# Patient Record
Sex: Female | Born: 1940 | Race: Black or African American | Hispanic: No | Marital: Single | Smoking: Current every day smoker
Health system: Southern US, Community
[De-identification: ages and names within clinical notes are randomized; demographics above are authoritative.]

## PROBLEM LIST (undated history)

## (undated) DIAGNOSIS — Z720 Tobacco use: Secondary | ICD-10-CM

## (undated) DIAGNOSIS — B888 Other specified infestations: Secondary | ICD-10-CM

## (undated) DIAGNOSIS — I10 Essential (primary) hypertension: Secondary | ICD-10-CM

## (undated) DIAGNOSIS — R609 Edema, unspecified: Secondary | ICD-10-CM

## (undated) DIAGNOSIS — E785 Hyperlipidemia, unspecified: Secondary | ICD-10-CM

## (undated) DIAGNOSIS — I77810 Thoracic aortic ectasia: Secondary | ICD-10-CM

## (undated) DIAGNOSIS — F1010 Alcohol abuse, uncomplicated: Secondary | ICD-10-CM

## (undated) DIAGNOSIS — E039 Hypothyroidism, unspecified: Secondary | ICD-10-CM

## (undated) DIAGNOSIS — I878 Other specified disorders of veins: Secondary | ICD-10-CM

## (undated) HISTORY — PX: BUNIONECTOMY: SHX129

## (undated) HISTORY — PX: ABDOMINAL HYSTERECTOMY: SHX81

---

## 1997-06-02 ENCOUNTER — Emergency Department (HOSPITAL_COMMUNITY): Admission: EM | Admit: 1997-06-02 | Discharge: 1997-06-02 | Payer: Self-pay | Admitting: Emergency Medicine

## 1998-06-22 ENCOUNTER — Emergency Department (HOSPITAL_COMMUNITY): Admission: EM | Admit: 1998-06-22 | Discharge: 1998-06-22 | Payer: Self-pay | Admitting: Emergency Medicine

## 2000-06-30 ENCOUNTER — Emergency Department (HOSPITAL_COMMUNITY): Admission: EM | Admit: 2000-06-30 | Discharge: 2000-06-30 | Payer: Self-pay | Admitting: Emergency Medicine

## 2001-05-10 ENCOUNTER — Emergency Department (HOSPITAL_COMMUNITY): Admission: EM | Admit: 2001-05-10 | Discharge: 2001-05-10 | Payer: Self-pay | Admitting: Emergency Medicine

## 2001-10-14 ENCOUNTER — Emergency Department (HOSPITAL_COMMUNITY): Admission: EM | Admit: 2001-10-14 | Discharge: 2001-10-14 | Payer: Self-pay | Admitting: *Deleted

## 2001-11-12 ENCOUNTER — Emergency Department (HOSPITAL_COMMUNITY): Admission: EM | Admit: 2001-11-12 | Discharge: 2001-11-12 | Payer: Self-pay | Admitting: Emergency Medicine

## 2004-01-03 ENCOUNTER — Emergency Department (HOSPITAL_COMMUNITY): Admission: EM | Admit: 2004-01-03 | Discharge: 2004-01-04 | Payer: Self-pay | Admitting: Emergency Medicine

## 2006-08-10 ENCOUNTER — Emergency Department (HOSPITAL_COMMUNITY): Admission: EM | Admit: 2006-08-10 | Discharge: 2006-08-10 | Payer: Self-pay | Admitting: Emergency Medicine

## 2010-11-25 ENCOUNTER — Ambulatory Visit (INDEPENDENT_AMBULATORY_CARE_PROVIDER_SITE_OTHER): Payer: Medicaid Other

## 2010-11-25 ENCOUNTER — Inpatient Hospital Stay (INDEPENDENT_AMBULATORY_CARE_PROVIDER_SITE_OTHER)
Admission: RE | Admit: 2010-11-25 | Discharge: 2010-11-25 | Disposition: A | Discharge status: Home / Self Care | Payer: Medicare Other | Source: Ambulatory Visit | Attending: Family Medicine | Admitting: Family Medicine

## 2010-11-25 DIAGNOSIS — R071 Chest pain on breathing: Secondary | ICD-10-CM

## 2010-11-25 DIAGNOSIS — R079 Chest pain, unspecified: Secondary | ICD-10-CM

## 2010-11-25 DIAGNOSIS — M79609 Pain in unspecified limb: Secondary | ICD-10-CM

## 2010-11-25 DIAGNOSIS — I889 Nonspecific lymphadenitis, unspecified: Secondary | ICD-10-CM

## 2010-12-06 LAB — I-STAT 8, (EC8 V) (CONVERTED LAB)
BUN: 8
Bicarbonate: 26.7 — ABNORMAL HIGH
Glucose, Bld: 95
Hemoglobin: 14.6
Operator id: 161631
Sodium: 139
TCO2: 28
pCO2, Ven: 45.1

## 2010-12-06 LAB — POCT CARDIAC MARKERS
CKMB, poc: 1.9
Myoglobin, poc: 71.5
Operator id: 161631

## 2010-12-06 LAB — POCT I-STAT CREATININE: Creatinine, Ser: 0.7

## 2012-09-21 ENCOUNTER — Encounter (HOSPITAL_COMMUNITY): Payer: Self-pay | Admitting: Unknown Physician Specialty

## 2012-09-21 ENCOUNTER — Emergency Department (HOSPITAL_COMMUNITY)
Admission: EM | Admit: 2012-09-21 | Discharge: 2012-09-22 | Disposition: A | Discharge status: Home / Self Care | Payer: Medicare Other | Attending: Emergency Medicine | Admitting: Emergency Medicine

## 2012-09-21 ENCOUNTER — Emergency Department (HOSPITAL_COMMUNITY): Payer: Medicare Other

## 2012-09-21 DIAGNOSIS — F17200 Nicotine dependence, unspecified, uncomplicated: Secondary | ICD-10-CM | POA: Insufficient documentation

## 2012-09-21 DIAGNOSIS — R55 Syncope and collapse: Secondary | ICD-10-CM | POA: Insufficient documentation

## 2012-09-21 DIAGNOSIS — Z789 Other specified health status: Secondary | ICD-10-CM

## 2012-09-21 DIAGNOSIS — F1010 Alcohol abuse, uncomplicated: Secondary | ICD-10-CM | POA: Insufficient documentation

## 2012-09-21 LAB — URINALYSIS, ROUTINE W REFLEX MICROSCOPIC
Hgb urine dipstick: NEGATIVE
Nitrite: NEGATIVE
Protein, ur: NEGATIVE mg/dL
Specific Gravity, Urine: 1.006 (ref 1.005–1.030)
Urobilinogen, UA: 1 mg/dL (ref 0.0–1.0)

## 2012-09-21 LAB — CBC WITH DIFFERENTIAL/PLATELET
Basophils Absolute: 0 10*3/uL (ref 0.0–0.1)
Eosinophils Relative: 3 % (ref 0–5)
HCT: 36.6 % (ref 36.0–46.0)
Hemoglobin: 12.8 g/dL (ref 12.0–15.0)
Lymphocytes Relative: 47 % — ABNORMAL HIGH (ref 12–46)
Lymphs Abs: 2.4 10*3/uL (ref 0.7–4.0)
MCV: 95.6 fL (ref 78.0–100.0)
Monocytes Absolute: 0.4 10*3/uL (ref 0.1–1.0)
Monocytes Relative: 7 % (ref 3–12)
Neutro Abs: 2.2 10*3/uL (ref 1.7–7.7)
RBC: 3.83 MIL/uL — ABNORMAL LOW (ref 3.87–5.11)
RDW: 13.1 % (ref 11.5–15.5)
WBC: 5.1 10*3/uL (ref 4.0–10.5)

## 2012-09-21 LAB — COMPREHENSIVE METABOLIC PANEL
AST: 18 U/L (ref 0–37)
BUN: 10 mg/dL (ref 6–23)
CO2: 23 mEq/L (ref 19–32)
Calcium: 8.9 mg/dL (ref 8.4–10.5)
Chloride: 104 mEq/L (ref 96–112)
Creatinine, Ser: 0.58 mg/dL (ref 0.50–1.10)
GFR calc Af Amer: >90 mL/min (ref >90)
GFR calc non Af Amer: >90 mL/min (ref >90)
Glucose, Bld: 96 mg/dL (ref 70–99)
Total Bilirubin: 0.5 mg/dL (ref 0.3–1.2)

## 2012-09-21 LAB — RAPID URINE DRUG SCREEN, HOSP PERFORMED
Barbiturates: NOT DETECTED
Opiates: NOT DETECTED
Tetrahydrocannabinol: POSITIVE — AB

## 2012-09-21 LAB — POCT I-STAT TROPONIN I: Troponin i, poc: 0.01 ng/mL (ref 0.00–0.08)

## 2012-09-21 MED ORDER — SODIUM CHLORIDE 0.9 % IV BOLUS (SEPSIS)
1000.0000 mL | Freq: Once | INTRAVENOUS | Status: AC
  Administered 2012-09-21: 1000 mL via INTRAVENOUS

## 2012-09-21 NOTE — ED Notes (Signed)
Patient arrived via [facility] from home. Neighbor called EMS due to patient passed out on her porch. EMS states patient is easily aroused and states she admits to ETOH of 3 beers today. Patients son recently passed away and patient is tearful. VSS.

## 2012-09-21 NOTE — ED Notes (Signed)
Pt was able to ambulate to the bathroom to give [place] a urine sample. Pt tolerated walking well.

## 2012-09-21 NOTE — ED Provider Notes (Addendum)
CSN: [id number]     Arrival date & time 09/21/12  1842 History     First MD Initiated Contact with Patient 09/21/12 1854     Chief Complaint  Patient presents with  . Altered Mental Status   (Consider location/radiation/quality/duration/timing/severity/associated sxs/prior Treatment) Patient is a 72 y.o. female presenting with syncope. The history is provided by the patient and the EMS personnel.  Loss of Consciousness Episode history:  Single Most recent episode:  Today Duration: unknown. Timing:  Constant Progression:  Resolved Chronicity:  New Context comment:  Pt's son died 2 days ago and she has been morning and crying and states drank 2-3 drinks today.  neighbor say pt slumped over sitting on the porch.  when EMS arrive pt was easily arousable Witnessed: yes   Relieved by:  Nothing Worsened by:  Nothing tried Ineffective treatments:  None tried Associated symptoms: no chest pain, no difficulty breathing, no fever, no focal weakness, no nausea, no shortness of breath, no vomiting and no weakness   Risk factors: no coronary artery disease   Risk factors comment:  Pt does smoke and drinks alcohol regularly   History reviewed. No pertinent past medical history. Past Surgical History  Procedure Laterality Date  . Abdominal hysterectomy     No family history on file. History  Substance Use Topics  . Smoking status: Current Every Day Smoker  . Smokeless tobacco: Not on file  . Alcohol Use: 1.2 oz/week    2 Cans of beer per week     Comment: 40 oz every other day.   OB History   Grav Para Term Preterm Abortions TAB SAB Ect Mult Living                 Review of Systems  Constitutional: Negative for fever.  Respiratory: Negative for shortness of breath.   Cardiovascular: Positive for syncope. Negative for chest pain.  Gastrointestinal: Negative for nausea and vomiting.  Neurological: Negative for focal weakness and weakness.  All other systems reviewed and are  negative.    Allergies  Review of patient's allergies indicates no known allergies.  Home Medications  No current outpatient prescriptions on file. BP 130/89  Pulse 107  Temp(Src) 97.9 F (36.6 C) (Oral)  Resp 20  SpO2 98% Physical Exam  Nursing note and vitals reviewed. Constitutional: She appears well-developed and well-nourished. No distress.  tearful  HENT:  Head: Normocephalic and atraumatic.  Mouth/Throat: Oropharynx is clear and moist.  Eyes: Conjunctivae and EOM are normal. Pupils are equal, round, and reactive to light.  Neck: Normal range of motion. Neck supple.  Cardiovascular: Normal rate, regular rhythm and intact distal pulses.   No murmur heard. Pulmonary/Chest: Effort normal and breath sounds normal. No respiratory distress. She has no wheezes. She has no rales. She exhibits no tenderness.  Abdominal: Soft. She exhibits no distension. There is no tenderness. There is no rebound and no guarding.  Musculoskeletal: Normal range of motion. She exhibits no edema and no tenderness.  Neurological: She is alert. She has normal strength. No sensory deficit.  Oriented to person and place but not year or day  Skin: Skin is warm and dry. No rash noted. No erythema.  Psychiatric: She has a normal mood and affect. Her behavior is normal.    ED Course   Procedures (including critical care time)  Labs Reviewed  CBC WITH DIFFERENTIAL - Abnormal; Notable for the following:    RBC 3.83 (*)    Neutrophils Relative % 42 (*)  Lymphocytes Relative 47 (*)    All other components within normal limits  COMPREHENSIVE METABOLIC PANEL - Abnormal; Notable for the following:    Potassium 3.3 (*)    Albumin 3.3 (*)    All other components within normal limits  URINE RAPID DRUG SCREEN (HOSP PERFORMED) - Abnormal; Notable for the following:    Tetrahydrocannabinol POSITIVE (*)    All other components within normal limits  ETHANOL - Abnormal; Notable for the following:    Alcohol,  Ethyl (B) 103 (*)    All other components within normal limits  URINALYSIS, ROUTINE W REFLEX MICROSCOPIC  POCT I-STAT TROPONIN I   Dg Chest 2 View  09/21/2012   *RADIOLOGY REPORT*  Clinical Data: Syncopal episode.  CHEST - 2 VIEW  Comparison: 08/10/2006.  Findings: The cardiac silhouette, mediastinal and hilar contours are normal and stable.  The lungs are clear.  No pleural effusion. The bony thorax is intact.  IMPRESSION: No acute cardiopulmonary findings.   Original Report Authenticated By: Rudie Meyer, M.D.     Date: 09/21/2012  Rate: 75  Rhythm: normal sinus rhythm  QRS Axis: normal  Intervals: normal  ST/T Wave abnormalities: nonspecific ST/T changes  Conduction Disutrbances:none  Narrative Interpretation:   Old EKG Reviewed: new t-wave inversion in anterior/inferior leads new from 2012   1. Alcohol consumption heavy   2. Syncope     MDM   The patient and is with the complaint of syncope today. Patient states she did drink 3-4 beers and has been wanting today because her son died 2 days ago.  She states she has been very upset today but hasn't eaten [place] sitting on her porch and noticed that she was slumped over and passed out. Patient was easily aroused when EMS arrived. She was awake and tearful here but has no focal complaints except that she does not feel well. He is likely to syncope was related to alcohol intoxication. She takes no medications she has no medical problems and her vital signs here are normal except for mild tachycardia. She is oriented to place and person but is disoriented to year.  Suspicion for cardiac etiology however patient may also have electrolyte abnormalities given her alcohol use., CMP, UA, UDS, EtOH, chest x-ray, EKG, troponin pending.  9:41 PM Labs unremarkable except for EtOH of 103 which is most likely the cause of pt passing out while sitting on her porch today.  Pt does have some non-specific ST/T changes from 2 years ago however denies CP  or SOB at any time today.  10:51 PM Pt feeling better and able to ambulate unassisted without difficulty.  Will d/c home.  Gwyneth Sprout, MD 09/21/12 1914  Gwyneth Sprout, MD 09/21/12 2257

## 2013-06-01 ENCOUNTER — Emergency Department (HOSPITAL_COMMUNITY): Payer: Medicare Other

## 2013-06-01 ENCOUNTER — Observation Stay (HOSPITAL_COMMUNITY)
Admission: EM | Admit: 2013-06-01 | Discharge: 2013-06-02 | Disposition: A | Discharge status: Home / Self Care | Payer: Medicare Other | Attending: Family Medicine | Admitting: Family Medicine

## 2013-06-01 ENCOUNTER — Encounter (HOSPITAL_COMMUNITY): Payer: Self-pay | Admitting: Emergency Medicine

## 2013-06-01 ENCOUNTER — Emergency Department (INDEPENDENT_AMBULATORY_CARE_PROVIDER_SITE_OTHER)
Admission: EM | Admit: 2013-06-01 | Discharge: 2013-06-01 | Disposition: A | Discharge status: Home / Self Care | Payer: Medicare Other | Source: Home / Self Care | Attending: Emergency Medicine | Admitting: Emergency Medicine

## 2013-06-01 DIAGNOSIS — R0602 Shortness of breath: Secondary | ICD-10-CM | POA: Insufficient documentation

## 2013-06-01 DIAGNOSIS — R0789 Other chest pain: Secondary | ICD-10-CM | POA: Diagnosis not present

## 2013-06-01 DIAGNOSIS — I209 Angina pectoris, unspecified: Secondary | ICD-10-CM | POA: Diagnosis not present

## 2013-06-01 DIAGNOSIS — F17200 Nicotine dependence, unspecified, uncomplicated: Secondary | ICD-10-CM | POA: Diagnosis not present

## 2013-06-01 DIAGNOSIS — M25519 Pain in unspecified shoulder: Secondary | ICD-10-CM | POA: Insufficient documentation

## 2013-06-01 DIAGNOSIS — R079 Chest pain, unspecified: Secondary | ICD-10-CM | POA: Diagnosis present

## 2013-06-01 DIAGNOSIS — R9431 Abnormal electrocardiogram [ECG] [EKG]: Secondary | ICD-10-CM

## 2013-06-01 LAB — COMPREHENSIVE METABOLIC PANEL
ALT: 9 U/L (ref 0–35)
AST: 16 U/L (ref 0–37)
Albumin: 3.3 g/dL — ABNORMAL LOW (ref 3.5–5.2)
Alkaline Phosphatase: 83 U/L (ref 39–117)
BILIRUBIN TOTAL: 0.7 mg/dL (ref 0.3–1.2)
BUN: 8 mg/dL (ref 6–23)
CALCIUM: 8.8 mg/dL (ref 8.4–10.5)
CO2: 24 meq/L (ref 19–32)
CREATININE: 0.47 mg/dL — AB (ref 0.50–1.10)
Chloride: 105 mEq/L (ref 96–112)
GLUCOSE: 88 mg/dL (ref 70–99)
Potassium: 3.9 mEq/L (ref 3.7–5.3)
Sodium: 143 mEq/L (ref 137–147)
Total Protein: 6.9 g/dL (ref 6.0–8.3)

## 2013-06-01 LAB — LIPID PANEL
Cholesterol: 202 mg/dL — ABNORMAL HIGH (ref 0–200)
HDL: 101 mg/dL (ref >39)
LDL CALC: 85 mg/dL (ref 0–99)
Total CHOL/HDL Ratio: 2 RATIO
Triglycerides: 82 mg/dL (ref <150)
VLDL: 16 mg/dL (ref 0–40)

## 2013-06-01 LAB — TSH: TSH: 6.81 u[IU]/mL — ABNORMAL HIGH (ref 0.350–4.500)

## 2013-06-01 LAB — CBC
HEMATOCRIT: 36 % (ref 36.0–46.0)
Hemoglobin: 12.3 g/dL (ref 12.0–15.0)
MCH: 33.1 pg (ref 26.0–34.0)
MCHC: 34.2 g/dL (ref 30.0–36.0)
MCV: 96.8 fL (ref 78.0–100.0)
PLATELETS: 218 10*3/uL (ref 150–400)
RBC: 3.72 MIL/uL — AB (ref 3.87–5.11)
RDW: 13.3 % (ref 11.5–15.5)
WBC: 6.8 10*3/uL (ref 4.0–10.5)

## 2013-06-01 LAB — PRO B NATRIURETIC PEPTIDE: PRO B NATRI PEPTIDE: 92.3 pg/mL (ref 0–125)

## 2013-06-01 LAB — TROPONIN I: Troponin I: <0.3 ng/mL (ref <0.30)

## 2013-06-01 LAB — I-STAT TROPONIN, ED: TROPONIN I, POC: 0 ng/mL (ref 0.00–0.08)

## 2013-06-01 MED ORDER — ASPIRIN EC 325 MG PO TBEC
325.0000 mg | DELAYED_RELEASE_TABLET | Freq: Every day | ORAL | Status: DC
  Administered 2013-06-01 – 2013-06-02 (×2): 325 mg via ORAL
  Filled 2013-06-01 (×2): qty 1

## 2013-06-01 MED ORDER — LORAZEPAM 2 MG/ML IJ SOLN: 1.0000 mg | Freq: Four times a day (QID) | INTRAMUSCULAR | Status: DC | PRN

## 2013-06-01 MED ORDER — FOLIC ACID 1 MG PO TABS
1.0000 mg | ORAL_TABLET | Freq: Every day | ORAL | Status: DC
  Administered 2013-06-01 – 2013-06-02 (×2): 1 mg via ORAL
  Filled 2013-06-01 (×2): qty 1

## 2013-06-01 MED ORDER — VITAMIN B-1 100 MG PO TABS
100.0000 mg | ORAL_TABLET | Freq: Every day | ORAL | Status: DC
  Administered 2013-06-01 – 2013-06-02 (×2): 100 mg via ORAL
  Filled 2013-06-01 (×2): qty 1

## 2013-06-01 MED ORDER — SODIUM CHLORIDE 0.9 % IV SOLN
INTRAVENOUS | Status: DC
  Administered 2013-06-01: 14:00:00 via INTRAVENOUS

## 2013-06-01 MED ORDER — MORPHINE SULFATE 2 MG/ML IJ SOLN: 1.0000 mg | INTRAMUSCULAR | Status: DC | PRN

## 2013-06-01 MED ORDER — LORAZEPAM 1 MG PO TABS: 1.0000 mg | ORAL_TABLET | Freq: Four times a day (QID) | ORAL | Status: DC | PRN

## 2013-06-01 MED ORDER — ASPIRIN 81 MG PO CHEW
CHEWABLE_TABLET | ORAL | Status: AC
  Filled 2013-06-01: qty 4

## 2013-06-01 MED ORDER — HEPARIN SODIUM (PORCINE) 5000 UNIT/ML IJ SOLN
5000.0000 [IU] | Freq: Three times a day (TID) | INTRAMUSCULAR | Status: DC
  Administered 2013-06-01 – 2013-06-02 (×2): 5000 [IU] via SUBCUTANEOUS
  Filled 2013-06-01 (×5): qty 1

## 2013-06-01 MED ORDER — ADULT MULTIVITAMIN W/MINERALS CH
1.0000 | ORAL_TABLET | Freq: Every day | ORAL | Status: DC
  Administered 2013-06-01 – 2013-06-02 (×2): 1 via ORAL
  Filled 2013-06-01 (×2): qty 1

## 2013-06-01 MED ORDER — THIAMINE HCL 100 MG/ML IJ SOLN
100.0000 mg | Freq: Every day | INTRAMUSCULAR | Status: DC
  Filled 2013-06-01 (×2): qty 1

## 2013-06-01 MED ORDER — NICOTINE 7 MG/24HR TD PT24
7.0000 mg | MEDICATED_PATCH | Freq: Every day | TRANSDERMAL | Status: DC
  Administered 2013-06-01 – 2013-06-02 (×2): 7 mg via TRANSDERMAL
  Filled 2013-06-01 (×2): qty 1

## 2013-06-01 MED ORDER — ASPIRIN 81 MG PO CHEW
324.0000 mg | CHEWABLE_TABLET | Freq: Once | ORAL | Status: AC
  Administered 2013-06-01: 324 mg via ORAL

## 2013-06-01 NOTE — H&P (Signed)
[facility] Admission History and Physical Service Pager: [phone]  Patient name: Ann Goodwin Medical record number: [id number] Date of birth: [birth year] Age: 73 y.o. Gender: female  Primary Care Provider: No PCP Per Patient Consultants: Cardiology Code Status: full  Chief Complaint: Chest Pain  Assessment and Plan: Ann Goodwin is a 73 y.o. female presenting with dyspnea and chest pain. PMH is significant for no medical condition, but lack of medical care.   # Chest Pain - Heart score 4. Chest pain story consistent with angina (stable vs unstable - down plays history per family members) vs possible COPD. istat trop negative and EKG: NSR with twave changes in leads 2,3 aVF that are unchanged from previous.  - Risk stat labs: A1c, TSH, Lipids - ECHO - BNP - repeat EKG in am - cycle troponins - ASA  # Daily Alcohol use: Pt admits to at least 3-16 ounce beers a day. Last drink yesterday (4/12)  - CIWA - UDS  FEN/GI: SLIV, Heart healthy diet - then NPO after midnight Prophylaxis: heparin subq  Disposition: Tele; discharge pending cardiac work-up  History of Present Illness: Ann Goodwin is a 73 y.o. female presenting with chest pain on and off x 6 months. Sharp left-side chest pain that radiates to right chest - substernum, is associated with SOB and orthopnea, and occassionally worse with exertion. She denies any chest pressure, n/v/d, fevers, or hx of MI, clots, HTN, DM, or FHx of MI. She admits to occasional leg edema bilaterally. She admits to smoking (~ 15 ppd hx) and drinking at least 3- 16 ounce beers nightly.    Review Of Systems: Per HPI with the following additions:  Otherwise 12 point review of systems was performed and was unremarkable.  Patient Active Problem List   Diagnosis Date Noted  . Chest pain 06/01/2013   Past Medical History: History reviewed. No pertinent past medical history. Past Surgical History: Past  Surgical History  Procedure Laterality Date  . Abdominal hysterectomy     Social History: History  Substance Use Topics  . Smoking status: Current Every Day Smoker  . Smokeless tobacco: Not on file  . Alcohol Use: 1.2 oz/week    2 Cans of beer per week     Comment: 40 oz every other day.   Additional social history:   Please also refer to relevant sections of EMR.  Family History: No family history on file. Allergies and Medications: No Known Allergies No current facility-administered medications on file prior to encounter.   No current outpatient prescriptions on file prior to encounter.    Objective: BP 140/80  Pulse 64  Temp(Src) 98.3 F (36.8 C) (Oral)  Resp 17  SpO2 100% Exam: General: Thin AA female in NAD. Nontoxic in appearance. HEENT: AT.[place]. MMM. Icterus present in bilateral eyes. Mild injections bilateral eyes. No LAD. Trachea midline. No thyroid megaly noted.  Cardiovascular: RRR, no m/r/g. No JVD Respiratory: CTAB, normal resp effort Abdomen: Soft, NTND, BS +. No masses. Vertical hysterectomy scar.  Extremities: WWP, No LE edema, no erythema, not TTP. +2/4 pulses bilaterally.  Neuro: A&O x3. PERLA., EOMI. CN2-12 grossly intact. No focal deficits. MS 5/5 bilateral UE/LE.   Labs and Imaging: CBC BMET   Recent Labs Lab 06/01/13 1518  WBC 6.8  HGB 12.3  HCT 36.0  PLT 218    Recent Labs Lab 06/01/13 1518  NA 143  K 3.9  CL 105  CO2 24  BUN 8  CREATININE 0.47*  GLUCOSE 88  CALCIUM 8.8     Troponin (Point of Care Test)  Recent Labs  06/01/13 1610  TROPIPOC 0.00     CXR 4/13 FINDINGS:  The heart size and mediastinal contours are within normal limits.  Both lungs are clear. The visualized skeletal structures are  unremarkable.  IMPRESSION:  No active cardiopulmonary disease.   Phill Myron, MD 06/01/2013, 6:23 PM PGY-1, [place] Intern pager: [phone], text pages welcome  Ma Hillock DO PGY-2  I  have examined the patient today with the intern. We have discussed the  assessment and plan with the attending. The intern note above, has been altered to reflect the exam, assessment and plan. Howard Pouch, DO

## 2013-06-01 NOTE — ED Notes (Signed)
C/o chest pain, numbness in hands, onset February 2015.  Patient denied chest pain presently.  Patient reports being tired and sob.  Sob has been intermittent.  Denies nausea or vomiting.  Patient delayed seeking medical attention until now because of no insurance plan.

## 2013-06-01 NOTE — ED Notes (Signed)
Cp on and off x 6 months left arm hurts pain rads to shoulder that pain stays hurts to move arm pt pain free now

## 2013-06-01 NOTE — ED Notes (Signed)
Pt arrived via [facility] already in gown, pt placed on monitor, continuous pulse oximetry and blood pressure cuff; family at bedside

## 2013-06-01 NOTE — ED Provider Notes (Signed)
Chief Complaint   Chief Complaint  Patient presents with  . Chest Pain    History of Present Illness    Ann Goodwin is a 73 year old female who presents with a 2 month history of daily, nonexertional, left pectoral chest pain which sometimes is located on the right. The pain can last 10 minutes at a time. It's not related to position, exertion, activity, or meals. It does not radiate into the neck, upper back, down the arms. The pain is rated as 7-8/10 in intensity. It's been associated with some shortness of breath and diaphoresis. It's not been associated with nausea. The patient also has some aching in her left shoulder, numbness of both of her hands, dizziness, shortness of breath, was not sleeping at night. She's felt hot and cold and has had coughing and wheezing. She was hospitalized 10-15 years ago with similar pain but according to her nothing was found. She has no history of heart disease. She has no history of diabetes, hyperlipidemia, hypertension, although she is not seen a primary care physician for years. She smokes about 3 cigarettes per day and drinks about 2 40 ounce beers per day.  Review of Systems    Other than noted above, the patient denies any of the following symptoms. Systemic:  No fever or chills. Pulmonary:  No cough, wheezing, shortness of breath, sputum production, hemoptysis. Cardiac:  No palpitations, rapid heartbeat, dizziness, presyncope or syncope. GI:  No abdominal pain, heartburn, nausea, or vomiting. Ext:  No leg pain or swelling.  [place]    Past medical history, family history, social history, meds, and allergies were reviewed.   Physical Exam     Vital signs:  BP 128/82  Pulse 89  Temp(Src) 98.1 F (36.7 C) (Oral)  Resp 22  SpO2 100% Gen:  Alert, oriented, in no distress, skin warm and dry. Eye:  PERRL, lids and conjunctivas normal.  Sclera non-icteric. ENT:  Mucous membranes moist, pharynx clear. Neck:  Supple, no adenopathy or  tenderness.  No JVD. Lungs:  Clear to auscultation, no wheezes, rales or rhonchi.  No respiratory distress. Heart:  Regular rhythm.  No gallops, murmers, clicks or rubs. Chest:  No chest wall tenderness. Abdomen:  Soft, nontender, no organomegaly or mass.  Bowel sounds normal.  No pulsatile abdominal mass or bruit. Ext:  No edema.  No calf tenderness and Homann's sign negative.  Pulses full and equal. Skin:  Warm and dry.  No rash.   EKG Results:  Date: 06/01/2013  Rate: 81  Rhythm: normal sinus rhythm  QRS Axis: normal  Intervals: normal  ST/T Wave abnormalities: normal  Conduction Disutrbances:none  Narrative Interpretation: Normal sinus rhythm, voltage criteria for left ventricular hypertrophy.  Old EKG Reviewed: unchanged    Course in Urgent [place]         And begun on IV normal saline at 50 mL per hour and given aspirin 325 mg by mouth. She was not given nitroglycerin and she's not in any pain right now.  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  Plan     The patient was transferred to the ED via [place] in stable condition.  Medical Decision Making:  73 year old female smoker presents with a 2 month history of non exertional left or right pectoral chest pain without radiation.  Episodes last 10 minutes and are accompanied by shortness of breath and sweats.  No nausea.  No cardiac history.  No history of HT, DM, or hyperlipidemia, but has not been to see a physician for years.  Smokes 3 cigarettes a day and drinks 2 40 oz beers a day. Her EKG shows voltage criteria for LVH but otherwise was nl, still, I am concerned about unstable angina with her story and feel she needs further evaluation.        Harden Mo, MD 06/01/13 [phone]

## 2013-06-01 NOTE — Discharge Instructions (Signed)
We have determined that your problem requires further evaluation in the emergency department.  We will take care of your transport there.  Once at the emergency department, you will be evaluated by a provider and they will order whatever treatment or tests they deem necessary.  We cannot guarantee that they will do any specific test or do any specific treatment.  ° °

## 2013-06-01 NOTE — ED Provider Notes (Signed)
CSN: [id number]     Arrival date & time 06/01/13  1449 History   First MD Initiated Contact with Patient 06/01/13 1501     Chief Complaint  Patient presents with  . Chest Pain     (Consider location/radiation/quality/duration/timing/severity/associated sxs/prior Treatment) The history is provided by the patient.   history of present illness: 73 year old female who presents with chief complaint of chest pain. Onset of symptoms was 2-3 months ago. She reports daily chest pain occurring both with exertion and at rest. Chest pain occurs in various locations including the left chest, the central chest, and the right chest. The pain is nonradiating. When it occurs she rates it a 7-8/10. Patient has found no inciting or alleviating factors. She has some mild associated shortness of breath with the episodes. No increased frequency of episodes. No chest pain at time of exam. Patient also reports left shoulder pain. Left shoulder pain has been present for several months. The discomfort in her shoulder is constant and is not associated with her episodes of chest pain. Pain is rated mild to moderate in severity and is exacerbated by abduction of the left arm. Patient seen today at urgent care where she was given aspirin prior to transfer.  History reviewed. No pertinent past medical history. Past Surgical History  Procedure Laterality Date  . Abdominal hysterectomy     No family history on file. History  Substance Use Topics  . Smoking status: Current Every Day Smoker  . Smokeless tobacco: Not on file  . Alcohol Use: 1.2 oz/week    2 Cans of beer per week     Comment: 40 oz every other day.   OB History   Grav Para Term Preterm Abortions TAB SAB Ect Mult Living                 Review of Systems  Constitutional: Negative for fever and chills.  HENT: Negative for congestion.   Eyes: Negative for pain.  Respiratory: Positive for shortness of breath.   Cardiovascular: Positive for chest pain.   Gastrointestinal: Negative for nausea, vomiting, abdominal pain, diarrhea and constipation.  Genitourinary: Negative for dysuria.  Musculoskeletal: Positive for arthralgias (left shoulder). Negative for back pain.  Skin: Negative for rash and wound.  Neurological: Negative for headaches.  All other systems reviewed and are negative.     Allergies  Review of patient's allergies indicates no known allergies.  Home Medications  No current outpatient prescriptions on file. BP 144/80  Pulse 67  Temp(Src) 98.3 F (36.8 C) (Oral)  Resp 18  SpO2 100% Physical Exam  Nursing note and vitals reviewed. Constitutional: She is oriented to person, place, and time. She appears well-developed and well-nourished. No distress.  HENT:  Head: Normocephalic and atraumatic.  Eyes: Conjunctivae are normal.  Neck: Neck supple.  Cardiovascular: Normal rate, regular rhythm, normal heart sounds and intact distal pulses.   Pulmonary/Chest: Effort normal and breath sounds normal. She has no wheezes. She has no rales.  Abdominal: Soft. She exhibits no distension. There is no tenderness.  Musculoskeletal: Normal range of motion. She exhibits no edema.       Left shoulder: She exhibits pain (mild reproducible pain with abduction). She exhibits normal range of motion, no tenderness, no swelling, no effusion, no deformity and normal pulse.  Neurological: She is alert and oriented to person, place, and time.  Skin: Skin is warm and dry.    ED Course  Procedures (including critical care time) Labs Review Labs Reviewed -  No data to display Imaging Review No results found.   EKG Interpretation   Date/Time:  Monday June 01 2013 15:01:21 EDT Ventricular Rate:  66 PR Interval:  182 QRS Duration: 92 QT Interval:  434 QTC Calculation: 455 R Axis:   47 Text Interpretation:  Sinus rhythm Left ventricular hypertrophy No  significant change since last tracing Confirmed by Ashok Cordia  MD, Lennette Bihari  ([id number]) on  06/01/2013 3:07:27 PM      MDM   Final diagnoses:  None    ITZELL BENDAVID is a 73 y.o. female with PMSH of tobacco use who has not seen a doctor in several years here with 2-3 months of daily chest pain with associated dyspnea. Vital signs stable presentation. Patient given aspirin prior to arrival.  Symptoms somewhat atypical but concerning for possible ACS. Symptoms not consistent with PE.  Labs/Imaging: CBC, CMP, troponin, chest x-ray   Date: 06/01/2013  Rate: 66  Rhythm: normal sinus rhythm  QRS Axis: normal  Intervals: normal  ST/T Wave abnormalities: nonspecific T wave changes  Conduction Disutrbances:none  Narrative Interpretation: Unchanged nonspecific T wave changes laterally, otherwise normal EKG  Old EKG Reviewed: unchanged   Patient heart score of 5. Labs and chest x-ray unremarkable. Do to the patient's risk factors and lack of followup, feel admission to medicine for further cardiac rule out most appropriate.  Patient admitted to the family medicine teaching service.     Renaldo Reel, MD 06/02/13 [phone]

## 2013-06-02 ENCOUNTER — Other Ambulatory Visit: Payer: Self-pay

## 2013-06-02 ENCOUNTER — Encounter (HOSPITAL_COMMUNITY): Payer: Medicare Other

## 2013-06-02 ENCOUNTER — Observation Stay (HOSPITAL_COMMUNITY): Payer: Medicare Other

## 2013-06-02 DIAGNOSIS — R072 Precordial pain: Secondary | ICD-10-CM

## 2013-06-02 DIAGNOSIS — R9431 Abnormal electrocardiogram [ECG] [EKG]: Secondary | ICD-10-CM

## 2013-06-02 DIAGNOSIS — R0789 Other chest pain: Secondary | ICD-10-CM | POA: Diagnosis not present

## 2013-06-02 DIAGNOSIS — R079 Chest pain, unspecified: Secondary | ICD-10-CM

## 2013-06-02 LAB — HEMOGLOBIN A1C
HEMOGLOBIN A1C: 5.8 % — AB (ref <5.7)
Mean Plasma Glucose: 120 mg/dL — ABNORMAL HIGH (ref <117)

## 2013-06-02 LAB — RAPID URINE DRUG SCREEN, HOSP PERFORMED
Amphetamines: NOT DETECTED
Barbiturates: NOT DETECTED
Benzodiazepines: NOT DETECTED
Cocaine: NOT DETECTED
Opiates: NOT DETECTED
Tetrahydrocannabinol: NOT DETECTED

## 2013-06-02 LAB — T4, FREE: Free T4: 0.91 ng/dL (ref 0.80–1.80)

## 2013-06-02 LAB — TROPONIN I: Troponin I: <0.3 ng/mL (ref <0.30)

## 2013-06-02 MED ORDER — REGADENOSON 0.4 MG/5ML IV SOLN
INTRAVENOUS | Status: AC
  Administered 2013-06-02: 0.4 mg
  Filled 2013-06-02: qty 5

## 2013-06-02 MED ORDER — REGADENOSON 0.4 MG/5ML IV SOLN
0.4000 mg | Freq: Once | INTRAVENOUS | Status: DC
  Filled 2013-06-02: qty 5

## 2013-06-02 MED ORDER — TECHNETIUM TC 99M SESTAMIBI GENERIC - CARDIOLITE
10.0000 | Freq: Once | INTRAVENOUS | Status: AC | PRN
  Administered 2013-06-02: 10 via INTRAVENOUS

## 2013-06-02 MED ORDER — TECHNETIUM TC 99M SESTAMIBI GENERIC - CARDIOLITE
30.0000 | Freq: Once | INTRAVENOUS | Status: AC | PRN
  Administered 2013-06-02: 30 via INTRAVENOUS

## 2013-06-02 MED ORDER — FOLIC ACID 1 MG PO TABS: 1.0000 mg | ORAL_TABLET | Freq: Every day | ORAL | Status: DC

## 2013-06-02 MED ORDER — ASPIRIN 325 MG PO TBEC: 325.0000 mg | DELAYED_RELEASE_TABLET | Freq: Every day | ORAL | Status: DC

## 2013-06-02 NOTE — ED Provider Notes (Signed)
Medical screening examination/treatment/procedure(s) were conducted as a shared visit with resident-physician practitioner(s) and myself.  I personally evaluated the patient during the encounter.  Pt is a 73 y.o. female with pmhx as above presenting with 2-3 months of intermittent CP, migratory, sometimes exertional, w/ assoc dyspnea.  She does not regularly seek medical care and has never had cardiac w/u.  Pt found to have nml trop & CXR, EKG w/o acute ischemic changes.   On PE, VSS, pt in NAD. Cardiopulm exam benign. No current pain.  HEART score of 5. I feel she would benefit from ACS r/o. Medicine admitting.     EKG Interpretation  Date/Time:  Tuesday June 02 2013 04:48:36 EDT Ventricular Rate:  66 PR Interval:  194 QRS Duration: 82 QT Interval:  414 QTC Calculation: 434 R Axis:   17 Text Interpretation:  Normal sinus rhythm Minimal voltage criteria for LVH, may be normal variant Nonspecific T wave abnormality Abnormal ECG        Neta Ehlers, MD 06/02/13 1021

## 2013-06-02 NOTE — Progress Notes (Signed)
UR completed 

## 2013-06-02 NOTE — Progress Notes (Signed)
Echocardiogram 2D Echocardiogram has been performed.  Ann Goodwin 06/02/2013, 12:35 PM

## 2013-06-02 NOTE — Progress Notes (Signed)
FMTS Attending Daily Note:  Ann Takeyla Million MD  [phone] pager  Family Practice pager:  [phone] I have discussed this patient with the resident Dr. Nettey and attending physician Dr. McDiarmid.  I agree with their findings, assessment, and care plan  

## 2013-06-02 NOTE — Progress Notes (Signed)
PGY-2 Update Note  Pt seen at bedside after stress Myoview. No evidence of ischemia on Myoview and normal structure/function on echo. Discussed with Dr. Cathie Olden who states nothing more to do from a cardiac standpoint prior to discharge. Pt states she feels much better. Will plan for discharge this afternoon. Advised her that we want her to start a daily aspirin and a daily folate vitamin when she goes home. Provided pt with resource list for PCP's for follow-up. Pt voiced understanding and expressed thanks.  Emmaline Kluver, MD PGY-2, [place] Medicine 06/02/2013, 3:21 PM [place] Service pager: [phone] (text pages welcome through [place])

## 2013-06-02 NOTE — Progress Notes (Signed)
D/c orders received;IVs removed with gauze on, pt remains in stable condition, pt meds and instructions reviewed and given to pt; pt d/c to home

## 2013-06-02 NOTE — Discharge Instructions (Signed)
You were admitted for chest pain, and all of your tests show that your heart function is normal. You HAVE NOT had a heart attack and your heart otherwise looks normal. We do want you to start taking a daily aspirin and a daily vitamin called folate.  You can use the list below to help you find a primary care doctor to establish care with. If you have more chest pain, shortness of breath, or any continued or new symptoms, call your regular doctor after you find one, or call 911 or come back to the emergency room.  Primary Care Doctor Resource Guide 1) Find a Doctor and Pay Out of Pocket Although you won't have to find out who is covered by your insurance plan, it is a good idea to ask around and get recommendations. You will then need to call the office and see if the doctor you have chosen will accept you as a new patient and what types of options they offer for patients who are self-pay. Some doctors offer discounts or will set up payment plans for their patients who do not have insurance, but you will need to ask so you aren't surprised when you get to your appointment.  2) Contact Your Local Health Department Not all health departments have doctors that can see patients for sick visits, but many do, so it is worth a call to see if yours does. If you don't know where your local health department is, you can check in your phone book. The CDC also has a tool to help you locate your state's health department, and many state websites also have listings of all of their local health departments.  3) Find a [facility] If your illness is not likely to be very severe or complicated, you may want to try a walk in clinic. These are popping up all over the country in pharmacies, drugstores, and shopping centers. They're usually staffed by nurse practitioners or physician assistants that have been trained to treat common illnesses and complaints. They're usually fairly quick and inexpensive. However, if you  have serious medical issues or chronic medical problems, these are probably not your best option.  No Primary Care Doctor: - Call [facility] at  [phone] - they can help you locate a primary care doctor that  accepts your insurance, provides certain services, etc. Physician Referral [phone]  Agencies that provide inexpensive medical care: [facility]  [address], [place] Phone:  [phone], Fax:  [phone]  [facility]   [phone]  [place]  [phone]  [place], [place] Phone: [phone]  [place], [place], [place] [phone], [place]

## 2013-06-02 NOTE — H&P (Signed)
I have seen and examined this patient. I have discussed with Drs Berkley Harvey and Raoul Pitch.  I agree with their findings and plans as documented in their admit note.

## 2013-06-02 NOTE — Progress Notes (Signed)
Family Medicine Teaching Service Daily Progress Note Intern Pager: [phone]  Patient name: Ann Goodwin Medical record number: [id number] Date of birth: 1940/06/09 Age: 73 y.o. Gender: female  Primary Care Provider: No PCP Per Patient Consultants: Cardiology Code Status: Full code  Pt Overview and Major Events to Date:  4/13: patient admitted  Assessment and Plan: Ann Goodwin is a 73 y.o. female presenting with dyspnea and chest pain. PMH is significant for no medical condition, but lack of medical care.   # Chest Pain  - Heart score 4. Chest pain story consistent with angina (stable vs unstable - down plays history per family members) vs possible COPD. istat trop negative and EKG: NSR with twave changes in leads 2,3 aVF that are unchanged from previous. TSH high, Lipid panel normal except very slightly elevated total cholesterol. BNP normal. Troponin negative x3.  Risk stat labs: A1c   follow-up Echo   Cardiology: Myoview today  ASA   # Daily Alcohol use: Pt admits to at least 3-16 ounce beers a day. Last drink yesterday (4/12). UDS positive for THC. Alcohol level of 103. CIWA scores of 0 overnight.  Continue CIWA   FEN/GI: SLIV, heart healthy diet Prophylaxis: heparin subq  Disposition: Discharge home pending cardiology workup  Subjective:  Patient reports no chest pain today. Has some left shoulder pain.  Objective: Temp:  [98.1 F (36.7 C)-98.7 F (37.1 C)] 98.7 F (37.1 C) (04/14 0000) Pulse Rate:  [62-89] 62 (04/14 0000) Resp:  [15-22] 18 (04/14 0000) BP: (128-159)/(64-96) 131/64 mmHg (04/14 0000) SpO2:  [97 %-100 %] 99 % (04/14 0000) Weight:  [140 lb (63.504 kg)] 140 lb (63.504 kg) (04/13 2205)  Physical Exam: General: Laying in bed in no acute distress Cardiovascular: Regular rate and rhythm, no murmurs Respiratory: Clear to auscultation bilaterally Abdomen: Soft, non-tender, non-distended Extremities: Warm, well perfused  Laboratory:  Recent  Labs Lab 06/01/13 1518  WBC 6.8  HGB 12.3  HCT 36.0  PLT 218    Recent Labs Lab 06/01/13 1518  NA 143  K 3.9  CL 105  CO2 24  BUN 8  CREATININE 0.47*  CALCIUM 8.8  PROT 6.9  BILITOT 0.7  ALKPHOS 83  ALT 9  AST 16  GLUCOSE 88   Lipid Panel     Component Value Date/Time   CHOL 202* 06/01/2013 2130   TRIG 82 06/01/2013 2130   HDL 101 06/01/2013 2130   CHOLHDL 2.0 06/01/2013 2130   VLDL 16 06/01/2013 2130   LDLCALC 85 06/01/2013 2130   Cardiac Panel (last 3 results)  Recent Labs  06/01/13 2130 06/01/13 2239 06/02/13 0117  TROPONINI <0.30 <0.30 <0.30    Imaging/Diagnostic Tests:  EKG: t-wave inversion in III and aVf  Cordelia Poche, MD 06/02/2013, 3:36 AM PGY-1, [place] Intern pager: [phone], text pages welcome

## 2013-06-02 NOTE — Consult Note (Signed)
CONSULT NOTE  Date: 06/02/2013               Patient Name:  Ann Goodwin MRN: [id number]  DOB: 02/08/1941 Age / Sex: 73 y.o., female        PCP: No PCP Per Patient Primary Cardiologist: New/ Nahser            Referring Physician: Hinton Rao              Reason for Consult:  chest pain            History of Present Illness: Patient is a 73 y.o. female with no significant past medical history, who was admitted to [facility] on 06/01/2013 for evaluation of chest discomfort.  She's been having chest pain off and on for years. Her history is very vague but she implied that she has more chest pain when she is up doing her housework. She also has episodes of pain when she is at rest. She's had a mildly abnormal EKG with T-wave inversions in the inferior leads. She's never had a stress test.  She has a long history of cigarette smoking. She drinks about 40 ounces of beer each day.  She's not entirely sure about her family history. She thinks that there may be some history of heart disease in her family. The patient does not go to the doctor but. She does not have any medical diagnosis but this is mostly because of lack of access to medical care..    Medications: Outpatient medications: Prescriptions prior to admission  Medication Sig Dispense Refill  . Aspirin-Acetaminophen-Caffeine (GOODY HEADACHE PO) Take 1 tablet by mouth daily as needed. For headache        Current medications: Current Facility-Administered Medications  Medication Dose Route Frequency Provider Last Rate Last Dose  . aspirin EC tablet 325 mg  325 mg Oral Daily Phill Myron, MD   325 mg at 06/01/13 2142  . folic acid (FOLVITE) tablet 1 mg  1 mg Oral Daily Phill Myron, MD   1 mg at 06/01/13 2141  . heparin injection 5,000 Units  5,000 Units Subcutaneous 3 times per day Phill Myron, MD   5,000 Units at 06/01/13 2142  . LORazepam (ATIVAN) tablet 1 mg  1 mg Oral Q6H PRN Phill Myron, MD       Or  . LORazepam  (ATIVAN) injection 1 mg  1 mg Intravenous Q6H PRN Phill Myron, MD      . morphine 2 MG/ML injection 1 mg  1 mg Intravenous Q4H PRN Phill Myron, MD      . multivitamin with minerals tablet 1 tablet  1 tablet Oral Daily Phill Myron, MD   1 tablet at 06/01/13 2141  . nicotine (NICODERM CQ - dosed in mg/24 hr) patch 7 mg  7 mg Transdermal Daily Phill Myron, MD   7 mg at 06/01/13 2140  . thiamine (VITAMIN B-1) tablet 100 mg  100 mg Oral Daily Phill Myron, MD   100 mg at 06/01/13 2141   Or  . thiamine (B-1) injection 100 mg  100 mg Intravenous Daily Phill Myron, MD         No Known Allergies   History reviewed. No pertinent past medical history.  Past Surgical History  Procedure Laterality Date  . Abdominal hysterectomy      No family history on file.  Social History:  reports that she has been smoking.  She does not have any smokeless tobacco history  on file. She reports that she drinks about 1.2 ounces of alcohol per week. She reports that she does not use illicit drugs.   Review of Systems: Constitutional:  denies fever, chills, diaphoresis, appetite change and fatigue.  HEENT: denies photophobia, eye pain, redness, hearing loss, ear pain, congestion, sore throat, rhinorrhea, sneezing, neck pain, neck stiffness and tinnitus.  Respiratory: denies SOB, DOE, cough, chest tightness, and wheezing.  Cardiovascular: admits to chest pain,  She denies  palpitations and leg swelling.  Gastrointestinal: denies nausea, vomiting, abdominal pain, diarrhea, constipation, blood in stool.  Genitourinary: denies dysuria, urgency, frequency, hematuria, flank pain and difficulty urinating.  Musculoskeletal: denies  myalgias, back pain, joint swelling, arthralgias and gait problem.   Skin: denies pallor, rash and wound.  Neurological: denies dizziness, seizures, syncope, weakness, light-headedness, numbness and headaches.   Hematological: denies adenopathy, easy bruising, personal or family bleeding  history.  Psychiatric/ Behavioral: denies suicidal ideation, mood changes, confusion, nervousness, sleep disturbance and agitation.    Physical Exam: BP 129/73  Pulse 64  Temp(Src) 98.2 F (36.8 C) (Oral)  Resp 16  Ht 5\' 6"  (1.676 m)  Wt 140 lb (63.504 kg)  BMI 22.61 kg/m2  SpO2 98%  Wt Readings from Last 3 Encounters:  06/01/13 140 lb (63.504 kg)    General: Vital signs reviewed and noted. Well-developed, well-nourished, in no acute distress; alert,   Head: Normocephalic, atraumatic, sclera anicteric,   Neck: Supple. Negative for carotid bruits. No JVD   Lungs:  Clear bilaterally, no  wheezes, rales, or rhonchi. Breathing is normal   Heart: RRR with S1 S2. No murmurs, rubs, or gallops   Abdomen:  Soft, non-tender, non-distended with normoactive bowel sounds. No hepatomegaly. No rebound/guarding. No obvious abdominal masses   MSK: Strength and the appear normal for age.   Extremities: No clubbing or cyanosis. No edema.  Distal pedal pulses are 2+ and equal   Neurologic: Alert and oriented X 3. Moves all extremities spontaneously.  Psych: Responds to questions appropriately with a normal affect.     Lab results: Basic Metabolic Panel:  Recent Labs Lab 06/01/13 1518  NA 143  K 3.9  CL 105  CO2 24  GLUCOSE 88  BUN 8  CREATININE 0.47*  CALCIUM 8.8    Liver Function Tests:  Recent Labs Lab 06/01/13 1518  AST 16  ALT 9  ALKPHOS 83  BILITOT 0.7  PROT 6.9  ALBUMIN 3.3*   No results found for this basename: LIPASE, AMYLASE,  in the last 168 hours No results found for this basename: AMMONIA,  in the last 168 hours  CBC:  Recent Labs Lab 06/01/13 1518  WBC 6.8  HGB 12.3  HCT 36.0  MCV 96.8  PLT 218    Cardiac Enzymes:  Recent Labs Lab 06/01/13 2130 06/01/13 2239 06/02/13 0117  TROPONINI <0.30 <0.30 <0.30    BNP: No components found with this basename: POCBNP,   CBG: No results found for this basename: GLUCAP,  in the last 168  hours  Coagulation Studies: No results found for this basename: LABPROT, INR,  in the last 72 hours   Other results: EKG :    NSR , TWI in the inferior leads.   Imaging: Dg Chest 2 View  06/01/2013   CLINICAL DATA:  Left-sided chest pain  EXAM: CHEST  2 VIEW  COMPARISON:  09/21/2012  FINDINGS: The heart size and mediastinal contours are within normal limits. Both lungs are clear. The visualized skeletal structures are unremarkable.  IMPRESSION:  No active cardiopulmonary disease.   Electronically Signed   By: Inez Catalina M.D.   On: 06/01/2013 16:37         Assessment & Plan:  1. Chest discomfort: The patient has had chest pain off and on for years. She really has not been to the doctor to discuss this. She has long history of cigarette smoking and may have a family history of coronary artery disease although she really doesn't know.  Her EKG shows T-wave inversions in the inferior leads. I think it would be reasonable to perform a [place] study to further evaluate these episodes of chest discomfort.  We'll get that study today   2. Hypothyroidism: Her TSH is 6.8. Further plans per the internal medicine team        Ramond Dial., MD, [facility] 06/02/2013, 8:25 AM Office - [phone] Pager [place]

## 2013-06-03 NOTE — Discharge Summary (Signed)
[facility] Discharge Summary  Patient name: Ann Goodwin Medical record number: [id number] Date of birth: 26-May-1940 Age: 73 y.o. Gender: female Date of Admission: 06/01/2013  Date of Discharge: 06/02/2013 Admitting Physician: Blane Ohara McDiarmid, MD  Primary Care Provider: No PCP Per Patient Consultants: Cardiology  Indication for Hospitalization: Chest pain  Discharge Diagnoses/Problem List:  Chest pain Tobacco abuse Alcohol abuse Subclinical hypothyroidism  Disposition: Discharge home  Discharge Condition: Stable  Discharge Exam:  General: Laying in bed in no acute distress  Cardiovascular: Regular rate and rhythm, no murmurs  Respiratory: Clear to auscultation bilaterally  Abdomen: Soft, non-tender, non-distended  Extremities: Warm, well perfused  Brief Hospital Course:  Patient presented with a 6 month history of intermittent left-sided chest pain radiating right chest and substernal area with associated shortness of breath, orthopnea and occasionally worse with exertion. Patient did not present with active chest pain. She smokes cigarettes and drinks at least three 16 oz beers nightly. Patient was admitted and worked up to rule out MI. Troponin negative, TSH high with normal T4. Lipid panel mostly normal with mildly elevated total cholesterol of 202. ProBNP of 92.3. Hemoglobin A1C of 5.8. Cardiology saw patient and felt appropriate to perform nuclear stress test. Stress test showed no evidence of ischemia. Patient was on CIWA protocol for her alcohol use. Did not require ativan.  Issues for Follow Up:  1. Continue aspirin 2. Patient needs diet modification since in prediabetic range 3. Smoking cessation 4. Alcohol consumption is excessive. Recommend counseling for patient 5. Patient may need follow-up of thyroid since currently subclinically hypothyroid  Significant Procedures: Myoview (Nuclear stress test)  Significant Labs and Imaging:    Recent Labs Lab 06/01/13 1518  WBC 6.8  HGB 12.3  HCT 36.0  PLT 218    Recent Labs Lab 06/01/13 1518  NA 143  K 3.9  CL 105  CO2 24  GLUCOSE 88  BUN 8  CREATININE 0.47*  CALCIUM 8.8  ALKPHOS 83  AST 16  ALT 9  ALBUMIN 3.3*   Cardiac Panel (last 3 results)  Recent Labs  06/01/13 2130 06/01/13 2239 06/02/13 0117  TROPONINI <0.30 <0.30 <0.30   Lab Results  Component Value Date   TSH 6.810* 06/01/2013   FREET4 0.91 06/02/2013    Results/Tests Pending at Time of Discharge: None  Discharge Medications:    Medication List    STOP taking these medications       GOODY HEADACHE PO      TAKE these medications       aspirin 325 MG EC tablet  Take 1 tablet (325 mg total) by mouth daily.     folic acid 1 MG tablet  Commonly known as:  FOLVITE  Take 1 tablet (1 mg total) by mouth daily.        Discharge Instructions: Please refer to Patient Instructions section of EMR for full details.  Patient was counseled important signs and symptoms that should prompt return to medical care, changes in medications, dietary instructions, activity restrictions, and follow up appointments.   Follow-Up Appointments:     Follow-up Information   Schedule an appointment as soon as possible for a visit with Primary Care Doctor. (Use the resource list in your discharge instructions to find a primary care doctor in the area to follow up with.)       Cordelia Poche, MD 06/03/2013, 4:18 PM PGY-1, [place]

## 2013-06-05 NOTE — Discharge Summary (Signed)
Family Medicine Teaching Service  Discharge Note : Attending Jeff Elijah Michaelis MD Pager [phone] Inpatient Team Pager:  [phone]  I have reviewed this patient and the patient's chart and have discussed discharge planning with the resident at the time of discharge. I agree with the discharge plan as above.    

## 2015-06-10 ENCOUNTER — Emergency Department (HOSPITAL_COMMUNITY): Payer: Medicare Other

## 2015-06-10 ENCOUNTER — Emergency Department (HOSPITAL_BASED_OUTPATIENT_CLINIC_OR_DEPARTMENT_OTHER): Admit: 2015-06-10 | Discharge: 2015-06-10 | Disposition: A | Discharge status: Home / Self Care | Payer: Medicare Other

## 2015-06-10 ENCOUNTER — Encounter (HOSPITAL_COMMUNITY): Payer: Self-pay | Admitting: *Deleted

## 2015-06-10 ENCOUNTER — Emergency Department (HOSPITAL_COMMUNITY)
Admission: EM | Admit: 2015-06-10 | Discharge: 2015-06-10 | Disposition: A | Discharge status: Home / Self Care | Payer: Medicare Other | Attending: Emergency Medicine | Admitting: Emergency Medicine

## 2015-06-10 DIAGNOSIS — I10 Essential (primary) hypertension: Secondary | ICD-10-CM | POA: Diagnosis not present

## 2015-06-10 DIAGNOSIS — R51 Headache: Secondary | ICD-10-CM | POA: Diagnosis not present

## 2015-06-10 DIAGNOSIS — Z79899 Other long term (current) drug therapy: Secondary | ICD-10-CM | POA: Insufficient documentation

## 2015-06-10 DIAGNOSIS — R531 Weakness: Secondary | ICD-10-CM | POA: Diagnosis not present

## 2015-06-10 DIAGNOSIS — M7122 Synovial cyst of popliteal space [Baker], left knee: Secondary | ICD-10-CM | POA: Diagnosis not present

## 2015-06-10 DIAGNOSIS — R519 Headache, unspecified: Secondary | ICD-10-CM

## 2015-06-10 DIAGNOSIS — M7989 Other specified soft tissue disorders: Secondary | ICD-10-CM

## 2015-06-10 DIAGNOSIS — R600 Localized edema: Secondary | ICD-10-CM

## 2015-06-10 DIAGNOSIS — M79609 Pain in unspecified limb: Secondary | ICD-10-CM | POA: Diagnosis not present

## 2015-06-10 DIAGNOSIS — R609 Edema, unspecified: Secondary | ICD-10-CM | POA: Diagnosis not present

## 2015-06-10 DIAGNOSIS — F17210 Nicotine dependence, cigarettes, uncomplicated: Secondary | ICD-10-CM | POA: Diagnosis not present

## 2015-06-10 DIAGNOSIS — Z7982 Long term (current) use of aspirin: Secondary | ICD-10-CM | POA: Diagnosis not present

## 2015-06-10 DIAGNOSIS — R200 Anesthesia of skin: Secondary | ICD-10-CM | POA: Diagnosis not present

## 2015-06-10 LAB — CBC
HCT: 36.9 % (ref 36.0–46.0)
Hemoglobin: 12.2 g/dL (ref 12.0–15.0)
MCH: 32 pg (ref 26.0–34.0)
MCHC: 33.1 g/dL (ref 30.0–36.0)
MCV: 96.9 fL (ref 78.0–100.0)
PLATELETS: 194 10*3/uL (ref 150–400)
RBC: 3.81 MIL/uL — AB (ref 3.87–5.11)
RDW: 13.6 % (ref 11.5–15.5)
WBC: 6.1 10*3/uL (ref 4.0–10.5)

## 2015-06-10 LAB — CBG MONITORING, ED: GLUCOSE-CAPILLARY: 109 mg/dL — AB (ref 65–99)

## 2015-06-10 LAB — BASIC METABOLIC PANEL
Anion gap: 12 (ref 5–15)
BUN: 8 mg/dL (ref 6–20)
CALCIUM: 8.8 mg/dL — AB (ref 8.9–10.3)
CO2: 22 mmol/L (ref 22–32)
CREATININE: 0.54 mg/dL (ref 0.44–1.00)
Chloride: 105 mmol/L (ref 101–111)
Glucose, Bld: 113 mg/dL — ABNORMAL HIGH (ref 65–99)
Potassium: 3.4 mmol/L — ABNORMAL LOW (ref 3.5–5.1)
SODIUM: 139 mmol/L (ref 135–145)

## 2015-06-10 LAB — URINALYSIS, ROUTINE W REFLEX MICROSCOPIC
Bilirubin Urine: NEGATIVE
Glucose, UA: NEGATIVE mg/dL
HGB URINE DIPSTICK: NEGATIVE
Ketones, ur: NEGATIVE mg/dL
Leukocytes, UA: NEGATIVE
Nitrite: NEGATIVE
PROTEIN: NEGATIVE mg/dL
Specific Gravity, Urine: 1.01 (ref 1.005–1.030)
pH: 6 (ref 5.0–8.0)

## 2015-06-10 MED ORDER — FUROSEMIDE 20 MG PO TABS: 20.0000 mg | ORAL_TABLET | Freq: Every day | ORAL | Status: DC

## 2015-06-10 NOTE — ED Notes (Signed)
Dr. Delo at bedside at this time.  

## 2015-06-10 NOTE — Discharge Instructions (Signed)
Take Lasix 20 mg daily for the next 5 days, then once daily as needed for swelling.  Keep your legs elevated when seated.  Follow-up with a primary care physician in the next 2 weeks, and return to the ER if symptoms significantly worsen or change.   Edema Edema is an abnormal buildup of fluids in your bodytissues. Edema is somewhatdependent on gravity to pull the fluid to the lowest place in your body. That makes the condition more common in the legs and thighs (lower extremities). Painless swelling of the feet and ankles is common and becomes more likely as you get older. It is also common in looser tissues, like around your eyes.  When the affected area is squeezed, the fluid may move out of that spot and leave a dent for a few moments. This dent is called pitting.  CAUSES  There are many possible causes of edema. Eating too much salt and being on your feet or sitting for a long time can cause edema in your legs and ankles. Hot weather may make edema worse. Common medical causes of edema include:  Heart failure.  Liver disease.  Kidney disease.  Weak blood vessels in your legs.  Cancer.  An injury.  Pregnancy.  Some medications.  Obesity. SYMPTOMS  Edema is usually painless.Your skin may look swollen or shiny.  DIAGNOSIS  Your health care provider may be able to diagnose edema by asking about your medical history and doing a physical exam. You may need to have tests such as X-rays, an electrocardiogram, or blood tests to check for medical conditions that may cause edema.  TREATMENT  Edema treatment depends on the cause. If you have heart, liver, or kidney disease, you need the treatment appropriate for these conditions. General treatment may include:  Elevation of the affected body part above the level of your heart.  Compression of the affected body part. Pressure from elastic bandages or support stockings squeezes the tissues and forces fluid back into the blood  vessels. This keeps fluid from entering the tissues.  Restriction of fluid and salt intake.  Use of a water pill (diuretic). These medications are appropriate only for some types of edema. They pull fluid out of your body and make you urinate more often. This gets rid of fluid and reduces swelling, but diuretics can have side effects. Only use diuretics as directed by your health care provider. HOME CARE INSTRUCTIONS   Keep the affected body part above the level of your heart when you are lying down.   Do not sit still or stand for prolonged periods.   Do not put anything directly under your knees when lying down.  Do not wear constricting clothing or garters on your upper legs.   Exercise your legs to work the fluid back into your blood vessels. This may help the swelling go down.   Wear elastic bandages or support stockings to reduce ankle swelling as directed by your health care provider.   Eat a low-salt diet to reduce fluid if your health care provider recommends it.   Only take medicines as directed by your health care provider. SEEK MEDICAL CARE IF:   Your edema is not responding to treatment.  You have heart, liver, or kidney disease and notice symptoms of edema.  You have edema in your legs that does not improve after elevating them.   You have sudden and unexplained weight gain. SEEK IMMEDIATE MEDICAL CARE IF:   You develop shortness of breath or  chest pain.   You cannot breathe when you lie down.  You develop pain, redness, or warmth in the swollen areas.   You have heart, liver, or kidney disease and suddenly get edema.  You have a fever and your symptoms suddenly get worse. MAKE SURE YOU:   Understand these instructions.  Will watch your condition.  Will get help right away if you are not doing well or get worse.   This information is not intended to replace advice given to you by your health care provider. Make sure you discuss any questions  you have with your health care provider.   Document Released: 02/05/2005 Document Revised: 02/26/2014 Document Reviewed: 11/28/2012 Elsevier Interactive Patient Education 2016 [place] Cyst A Baker cyst is a sac-like structure that forms in the back of the knee. It is filled with the same fluid that is located in your knee. This fluid lubricates the bones and cartilage of the knee and allows them to move over each other more easily. CAUSES  When the knee becomes injured or inflamed, increased fluid forms in the knee. When this happens, the joint lining is pushed out behind the knee and forms the Baker cyst. This cyst may also be caused by inflammation from arthritic conditions and infections. SIGNS AND SYMPTOMS  A Baker cyst usually has no symptoms. When the cyst is substantially enlarged:  You may feel pressure behind the knee, stiffness in the knee, or a mass in the area behind the knee.  You may develop pain, redness, and swelling in the calf. This can suggest a blood clot and requires evaluation by your health care provider. DIAGNOSIS  A Baker cyst is most often found during an ultrasound exam. This exam may have been performed for other reasons, and the cyst was found incidentally. Sometimes an MRI is used. This picks up other problems within a joint that an ultrasound exam may not. If the Baker cyst developed immediately after an injury, X-ray exams may be used to diagnose the cyst. TREATMENT  The treatment depends on the cause of the cyst. Anti-inflammatory medicines and rest often will be prescribed. If the cyst is caused by a bacterial infection, antibiotic medicines may be prescribed.  HOME CARE INSTRUCTIONS   If the cyst was caused by an injury, for the first 24 hours, keep the injured leg elevated on 2 pillows while lying down.  For the first 24 hours while you are awake, apply ice to the injured area:  Put ice in a plastic bag.  Place a towel between your skin  and the bag.  Leave the ice on for 20 minutes, 2-3 times a day.  Only take over-the-counter or prescription medicines for pain, discomfort, or fever as directed by your health care provider.  Only take antibiotic medicine as directed. Make sure to finish it even if you start to feel better. MAKE SURE YOU:   Understand these instructions.  Will watch your condition.  Will get help right away if you are not doing well or get worse.   This information is not intended to replace advice given to you by your health care provider. Make sure you discuss any questions you have with your health care provider.   Document Released: 02/05/2005 Document Revised: 11/26/2012 Document Reviewed: 09/17/2012 Elsevier Interactive Patient Education [employer].

## 2015-06-10 NOTE — ED Notes (Addendum)
Pt c/o bil LE edema, loss of balance, numbness to R hand and weakness x 6+ months.  Pt came in today b/c she was tired of the s/s.  LE swelling greater on L than R.  Pt drinks 2-3 40 oz beers per days.

## 2015-06-10 NOTE — Progress Notes (Signed)
VASCULAR LAB PRELIMINARY  PRELIMINARY  PRELIMINARY  PRELIMINARY  Left lower extremity venous duplex completed.     Left:  No evidence of DVT, superficial thrombosis, or Baker's cyst. Baker's cyst in left pop fossa and lymph node in left groin area.  Janifer Adie, RVT, RDMS 06/10/2015, 6:40 PM

## 2015-06-10 NOTE — ED Provider Notes (Signed)
CSN: [id number]     Arrival date & time 06/10/15  1232 History   First MD Initiated Contact with Patient 06/10/15 1606     Chief Complaint  Patient presents with  . Leg Swelling     (Consider location/radiation/quality/duration/timing/severity/associated sxs/prior Treatment) HPI Comments: Patient is a 75 year old female with history of hypertension who takes no medications. She presents for evaluation of multiple complaints. She reports generalized weakness and malaise for the past 6 month along with numbness to her right hand. She is also reporting of swelling and pain in her left leg in the absence of any injury or trauma. This is been going on for the past month. She also reports frontal headache. She denies any chest pain or shortness of breath. She denies any fevers or chills.  The history is provided by the patient.    History reviewed. No pertinent past medical history. Past Surgical History  Procedure Laterality Date  . Abdominal hysterectomy     No family history on file. Social History  Substance Use Topics  . Smoking status: Current Every Day Smoker -- 1.00 packs/day    Types: Cigarettes  . Smokeless tobacco: None  . Alcohol Use: 1.2 oz/week    2 Cans of beer per week     Comment: 2 or 3 40 oz every day   OB History    No data available     Review of Systems  All other systems reviewed and are negative.     Allergies  Review of patient's allergies indicates no known allergies.  Home Medications   Prior to Admission medications   Medication Sig Start Date End Date Taking? Authorizing Provider  aspirin EC 325 MG EC tablet Take 1 tablet (325 mg total) by mouth daily. 06/02/13   [place], MD  folic acid (FOLVITE) 1 MG tablet Take 1 tablet (1 mg total) by mouth daily. 06/02/13   Mariel Aloe, MD   BP 149/74 mmHg  Pulse 54  Temp(Src) 98.1 F (36.7 C) (Oral)  Resp 16  Wt 156 lb 9.6 oz (71.033 kg)  SpO2 100% Physical Exam  Constitutional: She  is oriented to person, place, and time. She appears well-developed and well-nourished. No distress.  HENT:  Head: Normocephalic and atraumatic.  Mouth/Throat: Oropharynx is clear and moist.  Eyes: EOM are normal. Pupils are equal, round, and reactive to light.  Neck: Normal range of motion. Neck supple.  Cardiovascular: Normal rate and regular rhythm.  Exam reveals no gallop and no friction rub.   No murmur heard. Pulmonary/Chest: Effort normal and breath sounds normal. No respiratory distress. She has no wheezes. She has no rales.  Abdominal: Soft. Bowel sounds are normal. She exhibits no distension. There is no tenderness.  Musculoskeletal: Normal range of motion. She exhibits edema.  The left lower extremity reveals 2-3+ edema in a stocking distribution. There is no calf tenderness and Homans sign is absent. DP pulses are palpable and symmetrical.  Neurological: She is alert and oriented to person, place, and time. No cranial nerve deficit. She exhibits normal muscle tone. Coordination normal.  Skin: Skin is warm and dry. She is not diaphoretic.  Nursing note and vitals reviewed.   ED Course  Procedures (including critical care time) Labs Review Labs Reviewed  BASIC METABOLIC PANEL - Abnormal; Notable for the following:    Potassium 3.4 (*)    Glucose, Bld 113 (*)    Calcium 8.8 (*)    All other components within normal limits  CBC - Abnormal; Notable for the following:    RBC 3.81 (*)    All other components within normal limits  CBG MONITORING, ED - Abnormal; Notable for the following:    Glucose-Capillary 109 (*)    All other components within normal limits  URINALYSIS, ROUTINE W REFLEX MICROSCOPIC (NOT AT [facility])    Imaging Review No results found. I have personally reviewed and evaluated these images and lab results as part of my medical decision-making.   EKG Interpretation   Date/Time:  Friday June 10 2015 13:36:09 EDT Ventricular Rate:  60 PR Interval:  200 QRS  Duration: 76 QT Interval:  458 QTC Calculation: 458 R Axis:   70 Text Interpretation:  Normal sinus rhythm Nonspecific T wave abnormality  Abnormal ECG No significant change since last tracing Confirmed by FLOYD  MD, Quillian Quince [id number]) on 06/10/2015 1:42:27 PM Also confirmed by Tyrone Nine MD,  DANIEL [phone]), editor Stout CT, Leda Gauze [phone])  on 06/10/2015 3:02:06 PM      MDM   Final diagnoses:  None    Workup reveals no obvious cause of the patient's symptoms. CT scan of the head is unremarkable for acute process, however does show chronic ischemic changes. Her laboratory studies are essentially unremarkable and DVT study is negative. She does seem to have a Baker's cyst in the left calf. The swelling of her legs will be treated with Lasix and elevation. Patient has no primary doctor and she will be provided with a resource guide to establish primary care. She understands to return in the meantime if symptoms significantly worsen or change.    Veryl Speak, MD 06/10/15 [phone]

## 2015-08-01 ENCOUNTER — Emergency Department (HOSPITAL_COMMUNITY)
Admission: EM | Admit: 2015-08-01 | Discharge: 2015-08-02 | Disposition: A | Discharge status: Home / Self Care | Payer: Medicare Other | Attending: Emergency Medicine | Admitting: Emergency Medicine

## 2015-08-01 ENCOUNTER — Encounter (HOSPITAL_COMMUNITY): Payer: Self-pay | Admitting: Vascular Surgery

## 2015-08-01 ENCOUNTER — Emergency Department (HOSPITAL_COMMUNITY): Payer: Medicare Other

## 2015-08-01 DIAGNOSIS — F17210 Nicotine dependence, cigarettes, uncomplicated: Secondary | ICD-10-CM | POA: Diagnosis not present

## 2015-08-01 DIAGNOSIS — M799 Soft tissue disorder, unspecified: Secondary | ICD-10-CM | POA: Insufficient documentation

## 2015-08-01 DIAGNOSIS — Z791 Long term (current) use of non-steroidal anti-inflammatories (NSAID): Secondary | ICD-10-CM | POA: Insufficient documentation

## 2015-08-01 DIAGNOSIS — M7989 Other specified soft tissue disorders: Secondary | ICD-10-CM

## 2015-08-01 LAB — BASIC METABOLIC PANEL
Anion gap: 9 (ref 5–15)
BUN: 8 mg/dL (ref 6–20)
CHLORIDE: 108 mmol/L (ref 101–111)
CO2: 23 mmol/L (ref 22–32)
CREATININE: 0.49 mg/dL (ref 0.44–1.00)
Calcium: 9.1 mg/dL (ref 8.9–10.3)
GFR calc Af Amer: >60 mL/min (ref >60)
GFR calc non Af Amer: >60 mL/min (ref >60)
GLUCOSE: 92 mg/dL (ref 65–99)
Potassium: 3.6 mmol/L (ref 3.5–5.1)
SODIUM: 140 mmol/L (ref 135–145)

## 2015-08-01 LAB — CBC
HCT: 37.3 % (ref 36.0–46.0)
Hemoglobin: 12.3 g/dL (ref 12.0–15.0)
MCH: 31.5 pg (ref 26.0–34.0)
MCHC: 33 g/dL (ref 30.0–36.0)
MCV: 95.4 fL (ref 78.0–100.0)
PLATELETS: 207 10*3/uL (ref 150–400)
RBC: 3.91 MIL/uL (ref 3.87–5.11)
RDW: 13.8 % (ref 11.5–15.5)
WBC: 5.6 10*3/uL (ref 4.0–10.5)

## 2015-08-01 LAB — I-STAT TROPONIN, ED: Troponin i, poc: 0 ng/mL (ref 0.00–0.08)

## 2015-08-01 MED ORDER — ENOXAPARIN SODIUM 60 MG/0.6ML ~~LOC~~ SOLN
1.0000 mg/kg | Freq: Once | SUBCUTANEOUS | Status: AC
  Administered 2015-08-01: 60 mg via SUBCUTANEOUS
  Filled 2015-08-01: qty 0.6

## 2015-08-01 NOTE — ED Provider Notes (Signed)
CSN: [id number]     Arrival date & time 08/01/15  1433 History   First MD Initiated Contact with Patient 08/01/15 2128     Chief Complaint  Patient presents with  . Leg Swelling     (Consider location/radiation/quality/duration/timing/severity/associated sxs/prior Treatment) HPI.Marland KitchenMarland KitchenMarland KitchenPatient is concerned about swelling in her left leg for 1-2 years along with elongated toenails.   No chest pain, dyspnea, posterior thigh or calf tenderness. Severity of symptoms moderate. ROS positive for chronic headaches, dizziness.  History reviewed. No pertinent past medical history. Past Surgical History  Procedure Laterality Date  . Abdominal hysterectomy     No family history on file. Social History  Substance Use Topics  . Smoking status: Current Every Day Smoker -- 1.00 packs/day    Types: Cigarettes  . Smokeless tobacco: None  . Alcohol Use: 1.2 oz/week    2 Cans of beer per week     Comment: 2 or 3 40 oz every day   OB History    No data available     Review of Systems  All other systems reviewed and are negative.     Allergies  Review of patient's allergies indicates no known allergies.  Home Medications   Prior to Admission medications   Medication Sig Start Date End Date Taking? Authorizing Provider  naproxen sodium (ANAPROX) 220 MG tablet Take 220 mg by mouth daily as needed (pain).    Yes Historical Provider, MD   BP 129/69 mmHg  Pulse 55  Temp(Src) 97.8 F (36.6 C) (Oral)  Resp 16  Ht 5\' 7"  (1.702 m)  Wt 135 lb (61.236 kg)  BMI 21.14 kg/m2  SpO2 100% Physical Exam  Constitutional: She is oriented to person, place, and time. She appears well-developed and well-nourished.  HENT:  Head: Normocephalic and atraumatic.  Eyes: Conjunctivae and EOM are normal. Pupils are equal, round, and reactive to light.  Neck: Normal range of motion. Neck supple.  Cardiovascular: Normal rate and regular rhythm.   Pulmonary/Chest: Effort normal and breath sounds normal.   Abdominal: Soft. Bowel sounds are normal.  Musculoskeletal: Normal range of motion.  Neurological: She is alert and oriented to person, place, and time.  Skin:  Left lower extremity is obviously more edematous than right, but no posterior thigh or calf tenderness. Toenails on both feet are excessively long.  Psychiatric: She has a normal mood and affect. Her behavior is normal.  Nursing note and vitals reviewed.   ED Course  Procedures (including critical care time) Labs Review Labs Reviewed  BASIC METABOLIC PANEL  [place], ED    Imaging Review Dg Chest 2 View  08/01/2015  CLINICAL DATA:  SOB, left side chest pain x 1 week. Pt states both hands stay numb. Leg swelling x 1 month. EXAM: CHEST  2 VIEW COMPARISON:  06/01/2013 FINDINGS: Heart size normal. No focal consolidations or pleural effusions. No pulmonary edema. Progressive degenerative changes urgent right shoulder. IMPRESSION: No evidence for acute  abnormality. Electronically Signed   By: Nolon Nations M.D.   On: 08/01/2015 16:14   I have personally reviewed and evaluated these images and lab results as part of my medical decision-making.   EKG Interpretation   Date/Time:  Monday August 01 2015 15:04:40 EDT Ventricular Rate:  59 PR Interval:  204 QRS Duration: 74 QT Interval:  456 QTC Calculation: 451 R Axis:   52 Text Interpretation:  Sinus bradycardia Nonspecific T wave abnormality  Abnormal ECG Confirmed by Raymonde Hamblin  MD, Olivia Royse ([id number]) on  08/01/2015 11:12:06 PM      MDM   Final diagnoses:  Swelling of left lower extremity    Patient's main concern is the swelling in her left leg. Will obtain a vascular study in the morning. Lovenox 1 mg/kg subcutaneous given. There is no posterior calf or thigh tenderness. Therefore, I doubt that patient has a DVT. Recommend podiatry follow-up for her toenails    Nat Christen, MD 08/02/15 0000

## 2015-08-01 NOTE — ED Notes (Signed)
Patient called for room 3X with no response 

## 2015-08-01 NOTE — Discharge Instructions (Signed)
You need to see a podiatrist for your toenails. Unfortunately, we do not have a podiatrist on our "on call" system. Return in the morning to the emergency department to get a test to rule out a blood clot in your leg leg.

## 2015-08-01 NOTE — ED Notes (Signed)
Pt reports to the ED for multiple complaints eval of BLE swelling (1 month), fingernail and toenail fungal infections (x 1 year), HAs (intermittently x 7-8 months), dizziness (x 7-8 months), and CPs (x 7-8 months). Reports she decided to come today because her legs were hurting more than normal. Pt A&Ox4, resp e/u, and skin warm and dry.

## 2015-08-04 ENCOUNTER — Encounter (HOSPITAL_COMMUNITY): Payer: Self-pay | Admitting: Emergency Medicine

## 2015-08-04 ENCOUNTER — Emergency Department (HOSPITAL_COMMUNITY)
Admission: EM | Admit: 2015-08-04 | Discharge: 2015-08-04 | Disposition: A | Discharge status: Home / Self Care | Payer: Medicare Other | Attending: Emergency Medicine | Admitting: Emergency Medicine

## 2015-08-04 ENCOUNTER — Emergency Department (HOSPITAL_COMMUNITY): Payer: Medicare Other

## 2015-08-04 DIAGNOSIS — F17210 Nicotine dependence, cigarettes, uncomplicated: Secondary | ICD-10-CM | POA: Diagnosis not present

## 2015-08-04 DIAGNOSIS — R0602 Shortness of breath: Secondary | ICD-10-CM | POA: Diagnosis not present

## 2015-08-04 DIAGNOSIS — M7989 Other specified soft tissue disorders: Secondary | ICD-10-CM | POA: Diagnosis present

## 2015-08-04 DIAGNOSIS — J439 Emphysema, unspecified: Secondary | ICD-10-CM | POA: Diagnosis not present

## 2015-08-04 DIAGNOSIS — I517 Cardiomegaly: Secondary | ICD-10-CM | POA: Insufficient documentation

## 2015-08-04 LAB — BASIC METABOLIC PANEL
Anion gap: 9 (ref 5–15)
BUN: 12 mg/dL (ref 6–20)
CHLORIDE: 107 mmol/L (ref 101–111)
CO2: 22 mmol/L (ref 22–32)
CREATININE: 0.66 mg/dL (ref 0.44–1.00)
Calcium: 9.3 mg/dL (ref 8.9–10.3)
GFR calc Af Amer: >60 mL/min (ref >60)
GFR calc non Af Amer: >60 mL/min (ref >60)
GLUCOSE: 114 mg/dL — AB (ref 65–99)
POTASSIUM: 3.9 mmol/L (ref 3.5–5.1)
Sodium: 138 mmol/L (ref 135–145)

## 2015-08-04 LAB — CBC
HEMATOCRIT: 39.2 % (ref 36.0–46.0)
Hemoglobin: 12.7 g/dL (ref 12.0–15.0)
MCH: 31.7 pg (ref 26.0–34.0)
MCHC: 32.4 g/dL (ref 30.0–36.0)
MCV: 97.8 fL (ref 78.0–100.0)
PLATELETS: 204 10*3/uL (ref 150–400)
RBC: 4.01 MIL/uL (ref 3.87–5.11)
RDW: 13.9 % (ref 11.5–15.5)
WBC: 7.4 10*3/uL (ref 4.0–10.5)

## 2015-08-04 LAB — BRAIN NATRIURETIC PEPTIDE: B NATRIURETIC PEPTIDE 5: 5.6 pg/mL (ref 0.0–100.0)

## 2015-08-04 LAB — HEPATIC FUNCTION PANEL
ALBUMIN: 3.4 g/dL — AB (ref 3.5–5.0)
ALK PHOS: 70 U/L (ref 38–126)
ALT: 11 U/L — AB (ref 14–54)
AST: 17 U/L (ref 15–41)
Bilirubin, Direct: 0.2 mg/dL (ref 0.1–0.5)
Indirect Bilirubin: 0.5 mg/dL (ref 0.3–0.9)
TOTAL PROTEIN: 6.8 g/dL (ref 6.5–8.1)
Total Bilirubin: 0.7 mg/dL (ref 0.3–1.2)

## 2015-08-04 LAB — D-DIMER, QUANTITATIVE: D-Dimer, Quant: 0.57 ug/mL-FEU — ABNORMAL HIGH (ref 0.00–0.50)

## 2015-08-04 MED ORDER — CEPHALEXIN 500 MG PO CAPS: 500.0000 mg | ORAL_CAPSULE | Freq: Two times a day (BID) | ORAL | Status: DC

## 2015-08-04 NOTE — ED Notes (Signed)
Pt verbalized understanding of d/c instructions and has no further questions. Pt stable and NAD.  

## 2015-08-04 NOTE — ED Notes (Signed)
Phlebotomy at bedside.

## 2015-08-04 NOTE — ED Notes (Signed)
Patient states bilateral leg swelling x 2 months.   Patient states no primary doctor.   Patient states sometimes she has a headache, but denies today.   Patient states she has had dizziness in the past, but denies today.   Patient denies shortness of breath, denies chest pain.

## 2015-08-04 NOTE — Discharge Instructions (Signed)
Take your medications as prescribed until you have completed your antibiotic prescription. Please follow up with a primary care provider from the Resource Guide provided below in 5 days for wound recheck and follow up regarding your leg swelling. Please return to the Emergency Department if symptoms worsen or new onset of fever, worsening swelling or redness, drainage, numbness, tingling, weakness, chest pain, shortness of breath.

## 2015-08-04 NOTE — ED Provider Notes (Signed)
CSN: [id number]     Arrival date & time 08/04/15  1158 History   First MD Initiated Contact with Patient 08/04/15 1636     Chief Complaint  Patient presents with  . Leg Swelling     (Consider location/radiation/quality/duration/timing/severity/associated sxs/prior Treatment) HPI   Patient is a 75 year old female with no reported past medical history presents the ED with complaint of worsening leg swelling. Patient reports she has had worsening swelling to bilateral legs for the past 2 months. She also endorses having associated intermittent pain to bilateral lower legs. Denies fever, chills, chest pain, shortness of breath, wheezing, palpitations, abdominal pain, nausea, vomiting, diarrhea, numbness, tingling, weakness. Patient denies taking any medications at home. Pt notes she was seen in the ED on 08/02/15 for similar sxs. Pt also reports having chronic headaches and lightheadedness but denies currently having any sxs.   History reviewed. No pertinent past medical history. Past Surgical History  Procedure Laterality Date  . Abdominal hysterectomy     No family history on file. Social History  Substance Use Topics  . Smoking status: Current Every Day Smoker -- 0.50 packs/day    Types: Cigarettes  . Smokeless tobacco: None  . Alcohol Use: 1.2 oz/week    2 Cans of beer per week     Comment: 2 or 3 40 oz every day   OB History    No data available     Review of Systems  Cardiovascular: Positive for leg swelling.  Neurological: Positive for light-headedness (chronic intermittent) and headaches (chronic intermittent).  All other systems reviewed and are negative.     Allergies  Review of patient's allergies indicates no known allergies.  Home Medications   Prior to Admission medications   Medication Sig Start Date End Date Taking? Authorizing Provider  naproxen sodium (ANAPROX) 220 MG tablet Take 220 mg by mouth daily as needed (pain).    Yes Historical Provider, MD   cephALEXin (KEFLEX) 500 MG capsule Take 1 capsule (500 mg total) by mouth 2 (two) times daily. 08/04/15   Chesley Noon [place], PA-C   BP 133/72 mmHg  Pulse 58  Temp(Src) 97.8 F (36.6 C) (Oral)  Resp 16  Wt 58.968 kg  SpO2 100% Physical Exam  Constitutional: She is oriented to person, place, and time. She appears well-developed and well-nourished. No distress.  HENT:  Head: Normocephalic and atraumatic.  Mouth/Throat: Oropharynx is clear and moist. No oropharyngeal exudate.  Eyes: Conjunctivae and EOM are normal. Pupils are equal, round, and reactive to light. Right eye exhibits no discharge. Left eye exhibits no discharge. No scleral icterus.  Neck: Normal range of motion. Neck supple.  Cardiovascular: Normal rate, regular rhythm, normal heart sounds and intact distal pulses.   Pulmonary/Chest: Effort normal. No respiratory distress. She has no wheezes. She exhibits no tenderness.  Mild crackles noted in bilateral lung bases  Abdominal: Soft. Bowel sounds are normal. She exhibits no distension and no mass. There is no tenderness. There is no rebound and no guarding.  Musculoskeletal: Normal range of motion. She exhibits edema and tenderness.  2+ pitting edema noted to right lower extremity, 1+ pitting edema noted to left lower extremity. Mild diffuse tenderness noted to right lower leg/calf.  FROM of bilateral hips, knees, ankles and feet. 2+ DP pulses. Sensation grossly intact. Long toe nails noted on both feet.  Neurological: She is alert and oriented to person, place, and time.  Skin: Skin is warm and dry. She is not diaphoretic.  Small wound noted to  left medial lower leg with mild surrounding erythema, no drainage, induration or fluctuance. TTP.  Nursing note and vitals reviewed.   ED Course  Procedures (including critical care time) Labs Review Labs Reviewed  BASIC METABOLIC PANEL - Abnormal; Notable for the following:    Glucose, Bld 114 (*)    All other components  within normal limits  HEPATIC FUNCTION PANEL - Abnormal; Notable for the following:    Albumin 3.4 (*)    ALT 11 (*)    All other components within normal limits  D-DIMER, QUANTITATIVE (NOT AT [facility]) - Abnormal; Notable for the following:    D-Dimer, Quant 0.57 (*)    All other components within normal limits  CBC  BRAIN NATRIURETIC PEPTIDE    Imaging Review Dg Chest 2 View  08/04/2015  CLINICAL DATA:  Bilateral leg swelling for 2 months. Shortness of breath and headache. EXAM: CHEST  2 VIEW COMPARISON:  08/01/2015 FINDINGS: Emphysema. Subsegmental atelectasis or scarring in the lingula. Atherosclerotic aortic arch with mild aortic tortuosity. Mild enlargement of the cardiopericardial silhouette, without edema. No pleural effusion. Thoracic spondylosis. IMPRESSION: 1. Emphysema. 2. Minimal scarring in the lingula. 3. Mild cardiomegaly, without edema. Electronically Signed   By: Van Clines M.D.   On: 08/04/2015 18:15   I have personally reviewed and evaluated these images and lab results as part of my medical decision-making.   EKG Interpretation   Date/Time:  Thursday August 04 2015 12:04:53 EDT Ventricular Rate:  89 PR Interval:  188 QRS Duration: 70 QT Interval:  366 QTC Calculation: 445 R Axis:   35 Text Interpretation:  Normal sinus rhythm Nonspecific T wave abnormality  Abnormal ECG background noise No significant change since last tracing  Confirmed by FLOYD MD, Quillian Quince [id number]) on 08/04/2015 5:32:06 PM      MDM   Final diagnoses:  Leg swelling   Patient presents with worsening leg swelling, left worse than right. Endorses chronic intermittent lightheadedness and headache but denies any other pain or complaints at this time. Denies fever. VSS. Exam revealed 2+ pitting edema to bilateral lower extremities, left worse than right. Small wound noted to left medial lower leg with mild surrounding erythema, mild tenderness to palpation. Bilateral lower extremities otherwise  are vascular intact. Mild bibasilar crackles noted.  Chart review shows patient has been seen in the ED twice over the past few months for her leg swelling. ED visit on 06/10/14 revealed negative lower extremity ultrasound. ED visit on 08/01/15 showed negative labs, negative chest x-ray. Patient was advised to follow-up the following day to have a vascular study performed the patient was given initial dose of Lovenox in the ED. Patient denies having outpatient vascular study performed. She notes her swelling has worsened over the past few days.  Initial labs unremarkable. Discussed case with Dr. Tyrone Nine. Plan to order LFTs, BNP and d-dimer for further evaluation of leg swelling. Chest x-ray revealed emphysema with minimal scarring in the lingula, mild cardiomegaly. BNP and LFTs unremarkable. D-dimer 0.57, age adjusted d-dimer negative. I suspect patient's right leg pain and swelling are likely due to cellulitis noted on exam. Plan to discharge patient home with Keflex. Discussed results and have her discharge with patient. I had an extensive discussion with the patient regarding establishing care with a PCP. I advised patient to follow up with a PCP within the next week for follow-up. Discussed return precautions with patient.    Chesley Noon [place], [place] 08/04/15 [place], DO 08/04/15 2026  Deno Etienne,  DO 08/04/15 2027

## 2015-08-26 ENCOUNTER — Other Ambulatory Visit: Payer: Self-pay | Admitting: Physician Assistant

## 2015-08-26 DIAGNOSIS — M79605 Pain in left leg: Secondary | ICD-10-CM

## 2015-08-26 DIAGNOSIS — R609 Edema, unspecified: Secondary | ICD-10-CM

## 2015-08-30 ENCOUNTER — Other Ambulatory Visit: Payer: Medicare Other

## 2015-08-31 ENCOUNTER — Ambulatory Visit
Admission: RE | Admit: 2015-08-31 | Discharge: 2015-08-31 | Disposition: A | Discharge status: Home / Self Care | Payer: Medicare Other | Source: Ambulatory Visit | Attending: Physician Assistant | Admitting: Physician Assistant

## 2015-08-31 DIAGNOSIS — M79605 Pain in left leg: Secondary | ICD-10-CM

## 2015-08-31 DIAGNOSIS — R609 Edema, unspecified: Secondary | ICD-10-CM

## 2015-10-10 ENCOUNTER — Ambulatory Visit: Payer: Medicare Other | Admitting: Podiatry

## 2015-10-15 ENCOUNTER — Emergency Department (HOSPITAL_COMMUNITY)
Admission: EM | Admit: 2015-10-15 | Discharge: 2015-10-15 | Disposition: A | Discharge status: Home / Self Care | Payer: Medicare Other | Attending: Emergency Medicine | Admitting: Emergency Medicine

## 2015-10-15 ENCOUNTER — Encounter (HOSPITAL_COMMUNITY): Payer: Self-pay

## 2015-10-15 DIAGNOSIS — L03116 Cellulitis of left lower limb: Secondary | ICD-10-CM | POA: Insufficient documentation

## 2015-10-15 DIAGNOSIS — I10 Essential (primary) hypertension: Secondary | ICD-10-CM | POA: Insufficient documentation

## 2015-10-15 DIAGNOSIS — F17210 Nicotine dependence, cigarettes, uncomplicated: Secondary | ICD-10-CM | POA: Diagnosis not present

## 2015-10-15 DIAGNOSIS — I872 Venous insufficiency (chronic) (peripheral): Secondary | ICD-10-CM

## 2015-10-15 DIAGNOSIS — L308 Other specified dermatitis: Secondary | ICD-10-CM | POA: Diagnosis not present

## 2015-10-15 DIAGNOSIS — M7989 Other specified soft tissue disorders: Secondary | ICD-10-CM | POA: Diagnosis present

## 2015-10-15 HISTORY — DX: Essential (primary) hypertension: I10

## 2015-10-15 LAB — I-STAT CHEM 8, ED
BUN: 8 mg/dL (ref 6–20)
CALCIUM ION: 1.1 mmol/L — AB (ref 1.12–1.23)
Chloride: 104 mmol/L (ref 101–111)
Creatinine, Ser: 0.5 mg/dL (ref 0.44–1.00)
Glucose, Bld: 101 mg/dL — ABNORMAL HIGH (ref 65–99)
HEMATOCRIT: 39 % (ref 36.0–46.0)
Hemoglobin: 13.3 g/dL (ref 12.0–15.0)
Potassium: 3.5 mmol/L (ref 3.5–5.1)
SODIUM: 143 mmol/L (ref 135–145)
TCO2: 26 mmol/L (ref 0–100)

## 2015-10-15 MED ORDER — HYDROCODONE-ACETAMINOPHEN 5-325 MG PO TABS
1.0000 | ORAL_TABLET | Freq: Once | ORAL | Status: AC
  Administered 2015-10-15: 1 via ORAL
  Filled 2015-10-15: qty 1

## 2015-10-15 MED ORDER — DOXYCYCLINE HYCLATE 100 MG PO CAPS: 100.0000 mg | ORAL_CAPSULE | Freq: Two times a day (BID) | ORAL | 0 refills | Status: DC

## 2015-10-15 NOTE — ED Notes (Signed)
Pt and family understood dc material. NAD noted. Scripts and paperwork given at [employer]

## 2015-10-15 NOTE — ED Notes (Addendum)
Noted increased redness and swelling to the lower left extremity. Patient does complain of pain and tightness to leg. Patient does have pedal pulses intact via doppler

## 2015-10-15 NOTE — Discharge Instructions (Signed)
Take antibiotics for 1 week. Keep wounds clean and elevate leg when sitting. Take your 20 mg lasix pill twice daily for 5 days.   Follow-up closely with your primary doctor.  If you were given medicines take as directed.  If you are on coumadin or contraceptives realize their levels and effectiveness is altered by many different medicines.  If you have any reaction (rash, tongues swelling, other) to the medicines stop taking and see a physician.    If your blood pressure was elevated in the ER make sure you follow up for management with a primary doctor or return for chest pain, shortness of breath or stroke symptoms. Return for persistent fevers, persistent vomiting or other concerns Please follow up as directed and return to the ER or see a physician for new or worsening symptoms.  Thank you. Vitals:   10/15/15 1601 10/15/15 1606 10/15/15 1839  BP: 124/71  122/70  Pulse: 69  71  Resp: 20  20  Temp: 98.3 F (36.8 C)  98.2 F (36.8 C)  TempSrc: Oral  Oral  SpO2: 100%  99%  Weight:  130 lb (59 kg)   Height:  5\' 5"  (1.651 m)

## 2015-10-15 NOTE — ED Provider Notes (Signed)
[facility] DEPT Provider Note   CSN: [id number] Arrival date & time: 10/15/15  1554     History   Chief Complaint Chief Complaint  Patient presents with  . Leg Pain  . Leg Swelling    HPI Ann Goodwin is a 75 y.o. female.  Patient history of high blood pressure, chronic venous stasis presents with swelling and drainage from her left lower extremity. Patient has been on antibiotics and Lasix in the past few months for similar. Patient does feel this is worsening drainage at times purulent and worsening warmth erythema. No fevers or chills per no vomiting. No blood clot history. Patient had an ultrasound done recently of her left lower extremity which is negative for DVT. Patient has no shortness of breath or chest pain   The history is provided by the patient.  Leg Pain      Past Medical History:  Diagnosis Date  . Hypertension     Patient Active Problem List   Diagnosis Date Noted  . Chest pain 06/01/2013    Past Surgical History:  Procedure Laterality Date  . ABDOMINAL HYSTERECTOMY      OB History    No data available       Home Medications    Prior to Admission medications   Medication Sig Start Date End Date Taking? Authorizing Provider  cephALEXin (KEFLEX) 500 MG capsule Take 1 capsule (500 mg total) by mouth 2 (two) times daily. 08/04/15   Nona Dell, PA-C  doxycycline (VIBRAMYCIN) 100 MG capsule Take 1 capsule (100 mg total) by mouth 2 (two) times daily. One po bid x 7 days 10/15/15   Elnora Morrison, MD  naproxen sodium (ANAPROX) 220 MG tablet Take 220 mg by mouth daily as needed (pain).     Historical Provider, MD    Family History No family history on file.  Social History Social History  Substance Use Topics  . Smoking status: Current Every Day Smoker    Packs/day: 0.50    Types: Cigarettes  . Smokeless tobacco: Never Used  . Alcohol use 1.2 oz/week    2 Cans of beer per week     Comment: 2 or 3 40 oz every day      Allergies   Review of patient's allergies indicates no known allergies.   Review of Systems Review of Systems  Constitutional: Negative for chills and fever.  HENT: Negative for congestion.   Eyes: Negative for visual disturbance.  Respiratory: Negative for shortness of breath.   Cardiovascular: Positive for leg swelling. Negative for chest pain.  Gastrointestinal: Negative for abdominal pain and vomiting.  Genitourinary: Negative for dysuria and flank pain.  Musculoskeletal: Negative for back pain, neck pain and neck stiffness.  Skin: Positive for rash.  Neurological: Negative for light-headedness and headaches.     Physical Exam Updated Vital Signs BP 122/70 (BP Location: Left Arm)   Pulse 71   Temp 98.2 F (36.8 C) (Oral)   Resp 20   Ht 5\' 5"  (1.651 m)   Wt 130 lb (59 kg)   SpO2 99%   BMI 21.63 kg/m   Physical Exam  Constitutional: She is oriented to person, place, and time. She appears well-developed and well-nourished.  HENT:  Head: Normocephalic and atraumatic.  Eyes: Right eye exhibits no discharge. Left eye exhibits no discharge.  Neck: Neck supple. No tracheal deviation present.  Cardiovascular: Normal rate and regular rhythm.   Pulmonary/Chest: Effort normal.  Abdominal: Soft. She exhibits no distension. There is  no tenderness. There is no guarding.  Musculoskeletal: She exhibits edema and tenderness.  Neurological: She is alert and oriented to person, place, and time.  Skin: Skin is warm. Rash noted.  Patient has bilateral lower extremity edema worse in the left. Patient has chronic skin changes both legs worse in the left. Patient has both clear and mild purulent drainage left anterior lower extremity.  No calf tenderness. Mild erythema and warmth LLE.   Psychiatric: She has a normal mood and affect.  Nursing note and vitals reviewed.    ED Treatments / Results  Labs (all labs ordered are listed, but only abnormal results are displayed) Labs  Reviewed - No data to display  EKG  EKG Interpretation None       Radiology No results found.  Procedures Procedures (including critical care time)  Medications Ordered in ED Medications - No data to display   Initial Impression / Assessment and Plan / ED Course  I have reviewed the triage vital signs and the nursing notes.  Pertinent labs & imaging results that were available during my care of the patient were reviewed by me and considered in my medical decision making (see chart for details).  Clinical Course   Patient with chronic venous stasis history presents with clinical concern for cellulitis in addition to her chronic venous stasis. Antibiotics indicated with unilateral symptoms drainage warmth and erythema. Discussed increasing Lasix dose or 5 days and close follow-up later this week with primary doctor.  Results and differential diagnosis were discussed with the patient/parent/guardian. Xrays were independently reviewed by myself.  Close follow up outpatient was discussed, comfortable with the plan.   Medications - No data to display  Vitals:   10/15/15 1601 10/15/15 1606 10/15/15 1839  BP: 124/71  122/70  Pulse: 69  71  Resp: 20  20  Temp: 98.3 F (36.8 C)  98.2 F (36.8 C)  TempSrc: Oral  Oral  SpO2: 100%  99%  Weight:  130 lb (59 kg)   Height:  5\' 5"  (1.651 m)     Final diagnoses:  Venous stasis dermatitis of left lower extremity  Cellulitis of left lower extremity     Final Clinical Impressions(s) / ED Diagnoses   Final diagnoses:  Venous stasis dermatitis of left lower extremity  Cellulitis of left lower extremity    New Prescriptions New Prescriptions   DOXYCYCLINE (VIBRAMYCIN) 100 MG CAPSULE    Take 1 capsule (100 mg total) by mouth 2 (two) times daily. One po bid x 7 days     Elnora Morrison, MD 10/17/15 [phone]

## 2015-10-15 NOTE — ED Triage Notes (Signed)
Lt. Leg is swollen, pain, itching and open sores for over 1 month,.  Her rt. Leg is itchy.  He doctor placed her on antibiotics but it has not helped.

## 2015-10-26 ENCOUNTER — Ambulatory Visit: Payer: Medicare Other | Admitting: Podiatry

## 2015-11-02 ENCOUNTER — Emergency Department (HOSPITAL_COMMUNITY): Payer: Medicare Other

## 2015-11-02 ENCOUNTER — Encounter (HOSPITAL_COMMUNITY): Payer: Self-pay | Admitting: Family Medicine

## 2015-11-02 ENCOUNTER — Inpatient Hospital Stay (HOSPITAL_COMMUNITY)
Admission: EM | Admit: 2015-11-02 | Discharge: 2015-11-05 | DRG: 603 | Disposition: A | Discharge status: Home Health | Payer: Medicare Other | Attending: Internal Medicine | Admitting: Internal Medicine

## 2015-11-02 DIAGNOSIS — Z23 Encounter for immunization: Secondary | ICD-10-CM

## 2015-11-02 DIAGNOSIS — I10 Essential (primary) hypertension: Secondary | ICD-10-CM | POA: Diagnosis present

## 2015-11-02 DIAGNOSIS — I872 Venous insufficiency (chronic) (peripheral): Secondary | ICD-10-CM | POA: Diagnosis present

## 2015-11-02 DIAGNOSIS — I8312 Varicose veins of left lower extremity with inflammation: Secondary | ICD-10-CM | POA: Diagnosis not present

## 2015-11-02 DIAGNOSIS — F1020 Alcohol dependence, uncomplicated: Secondary | ICD-10-CM | POA: Diagnosis not present

## 2015-11-02 DIAGNOSIS — I878 Other specified disorders of veins: Secondary | ICD-10-CM | POA: Diagnosis present

## 2015-11-02 DIAGNOSIS — L03116 Cellulitis of left lower limb: Principal | ICD-10-CM | POA: Diagnosis present

## 2015-11-02 DIAGNOSIS — E876 Hypokalemia: Secondary | ICD-10-CM

## 2015-11-02 DIAGNOSIS — Z79899 Other long term (current) drug therapy: Secondary | ICD-10-CM

## 2015-11-02 DIAGNOSIS — I119 Hypertensive heart disease without heart failure: Secondary | ICD-10-CM | POA: Diagnosis present

## 2015-11-02 DIAGNOSIS — F17210 Nicotine dependence, cigarettes, uncomplicated: Secondary | ICD-10-CM | POA: Diagnosis present

## 2015-11-02 DIAGNOSIS — E785 Hyperlipidemia, unspecified: Secondary | ICD-10-CM | POA: Diagnosis present

## 2015-11-02 DIAGNOSIS — L97929 Non-pressure chronic ulcer of unspecified part of left lower leg with unspecified severity: Secondary | ICD-10-CM | POA: Diagnosis present

## 2015-11-02 DIAGNOSIS — E039 Hypothyroidism, unspecified: Secondary | ICD-10-CM | POA: Diagnosis present

## 2015-11-02 DIAGNOSIS — L0390 Cellulitis, unspecified: Secondary | ICD-10-CM | POA: Diagnosis present

## 2015-11-02 LAB — URINALYSIS, ROUTINE W REFLEX MICROSCOPIC
BILIRUBIN URINE: NEGATIVE
Glucose, UA: NEGATIVE mg/dL
HGB URINE DIPSTICK: NEGATIVE
KETONES UR: NEGATIVE mg/dL
LEUKOCYTES UA: NEGATIVE
NITRITE: NEGATIVE
Protein, ur: NEGATIVE mg/dL
Specific Gravity, Urine: 1.021 (ref 1.005–1.030)
pH: 5 (ref 5.0–8.0)

## 2015-11-02 LAB — I-STAT BETA HCG BLOOD, ED (MC, WL, AP ONLY): I-stat hCG, quantitative: <5 m[IU]/mL (ref <5)

## 2015-11-02 LAB — COMPREHENSIVE METABOLIC PANEL
ALBUMIN: 3.5 g/dL (ref 3.5–5.0)
ALT: 11 U/L — ABNORMAL LOW (ref 14–54)
ANION GAP: 9 (ref 5–15)
AST: 19 U/L (ref 15–41)
Alkaline Phosphatase: 83 U/L (ref 38–126)
BUN: 9 mg/dL (ref 6–20)
CO2: 23 mmol/L (ref 22–32)
Calcium: 9.3 mg/dL (ref 8.9–10.3)
Chloride: 105 mmol/L (ref 101–111)
Creatinine, Ser: 0.68 mg/dL (ref 0.44–1.00)
GFR calc Af Amer: >60 mL/min (ref >60)
GFR calc non Af Amer: >60 mL/min (ref >60)
GLUCOSE: 103 mg/dL — AB (ref 65–99)
POTASSIUM: 3.4 mmol/L — AB (ref 3.5–5.1)
SODIUM: 137 mmol/L (ref 135–145)
TOTAL PROTEIN: 7.6 g/dL (ref 6.5–8.1)
Total Bilirubin: 0.6 mg/dL (ref 0.3–1.2)

## 2015-11-02 LAB — CBC WITH DIFFERENTIAL/PLATELET
BASOS PCT: 1 %
Basophils Absolute: 0 10*3/uL (ref 0.0–0.1)
EOS PCT: 4 %
Eosinophils Absolute: 0.3 10*3/uL (ref 0.0–0.7)
HCT: 38.1 % (ref 36.0–46.0)
Hemoglobin: 12.7 g/dL (ref 12.0–15.0)
LYMPHS PCT: 24 %
Lymphs Abs: 1.6 10*3/uL (ref 0.7–4.0)
MCH: 32.7 pg (ref 26.0–34.0)
MCHC: 33.3 g/dL (ref 30.0–36.0)
MCV: 98.2 fL (ref 78.0–100.0)
Monocytes Absolute: 0.6 10*3/uL (ref 0.1–1.0)
Monocytes Relative: 9 %
NEUTROS ABS: 4.1 10*3/uL (ref 1.7–7.7)
Neutrophils Relative %: 62 %
PLATELETS: 222 10*3/uL (ref 150–400)
RBC: 3.88 MIL/uL (ref 3.87–5.11)
RDW: 13.6 % (ref 11.5–15.5)
WBC: 6.5 10*3/uL (ref 4.0–10.5)

## 2015-11-02 LAB — I-STAT CG4 LACTIC ACID, ED: LACTIC ACID, VENOUS: 1.51 mmol/L (ref 0.5–1.9)

## 2015-11-02 LAB — MAGNESIUM: MAGNESIUM: 1.7 mg/dL (ref 1.7–2.4)

## 2015-11-02 MED ORDER — INFLUENZA VAC SPLIT QUAD 0.5 ML IM SUSY
0.5000 mL | PREFILLED_SYRINGE | INTRAMUSCULAR | Status: AC
  Administered 2015-11-03: 0.5 mL via INTRAMUSCULAR
  Filled 2015-11-02: qty 0.5

## 2015-11-02 MED ORDER — LORAZEPAM 1 MG PO TABS
0.0000 mg | ORAL_TABLET | Freq: Two times a day (BID) | ORAL | Status: DC
Start: 2015-11-04 — End: 2015-11-04

## 2015-11-02 MED ORDER — FUROSEMIDE 20 MG PO TABS
20.0000 mg | ORAL_TABLET | Freq: Every day | ORAL | Status: DC
  Administered 2015-11-03 – 2015-11-05 (×3): 20 mg via ORAL
  Filled 2015-11-02 (×3): qty 1

## 2015-11-02 MED ORDER — BISACODYL 5 MG PO TBEC: 5.0000 mg | DELAYED_RELEASE_TABLET | Freq: Every day | ORAL | Status: DC | PRN

## 2015-11-02 MED ORDER — POTASSIUM CHLORIDE CRYS ER 20 MEQ PO TBCR
20.0000 meq | EXTENDED_RELEASE_TABLET | Freq: Once | ORAL | Status: AC
  Administered 2015-11-02: 20 meq via ORAL
  Filled 2015-11-02: qty 1

## 2015-11-02 MED ORDER — ONDANSETRON HCL 4 MG PO TABS: 4.0000 mg | ORAL_TABLET | Freq: Four times a day (QID) | ORAL | Status: DC | PRN

## 2015-11-02 MED ORDER — LORAZEPAM 1 MG PO TABS: 1.0000 mg | ORAL_TABLET | Freq: Four times a day (QID) | ORAL | Status: DC | PRN

## 2015-11-02 MED ORDER — CEFTRIAXONE SODIUM 1 G IJ SOLR: 1.0000 g | INTRAMUSCULAR | Status: DC

## 2015-11-02 MED ORDER — POLYETHYLENE GLYCOL 3350 17 G PO PACK: 17.0000 g | PACK | Freq: Every day | ORAL | Status: DC | PRN

## 2015-11-02 MED ORDER — VANCOMYCIN HCL IN DEXTROSE 1-5 GM/200ML-% IV SOLN
1000.0000 mg | Freq: Once | INTRAVENOUS | Status: AC
  Administered 2015-11-02: 1000 mg via INTRAVENOUS
  Filled 2015-11-02: qty 200

## 2015-11-02 MED ORDER — LORAZEPAM 1 MG PO TABS: 0.0000 mg | ORAL_TABLET | Freq: Four times a day (QID) | ORAL | Status: DC

## 2015-11-02 MED ORDER — LORAZEPAM 2 MG/ML IJ SOLN: 1.0000 mg | Freq: Four times a day (QID) | INTRAMUSCULAR | Status: DC | PRN

## 2015-11-02 MED ORDER — ACETAMINOPHEN 325 MG PO TABS: 650.0000 mg | ORAL_TABLET | Freq: Four times a day (QID) | ORAL | Status: DC | PRN

## 2015-11-02 MED ORDER — VANCOMYCIN HCL IN DEXTROSE 1-5 GM/200ML-% IV SOLN
1000.0000 mg | INTRAVENOUS | Status: DC
  Filled 2015-11-02: qty 200

## 2015-11-02 MED ORDER — ENOXAPARIN SODIUM 40 MG/0.4ML ~~LOC~~ SOLN
40.0000 mg | SUBCUTANEOUS | Status: DC
  Administered 2015-11-03 – 2015-11-05 (×3): 40 mg via SUBCUTANEOUS
  Filled 2015-11-02 (×3): qty 0.4

## 2015-11-02 MED ORDER — THIAMINE HCL 100 MG/ML IJ SOLN
100.0000 mg | Freq: Every day | INTRAMUSCULAR | Status: DC
  Filled 2015-11-02: qty 2

## 2015-11-02 MED ORDER — HYDROCODONE-ACETAMINOPHEN 5-325 MG PO TABS
1.0000 | ORAL_TABLET | ORAL | Status: DC | PRN
  Administered 2015-11-02 – 2015-11-03 (×2): 1 via ORAL
  Administered 2015-11-03 – 2015-11-05 (×3): 2 via ORAL
  Filled 2015-11-02: qty 2
  Filled 2015-11-02: qty 1
  Filled 2015-11-02: qty 2
  Filled 2015-11-02: qty 1
  Filled 2015-11-02: qty 2

## 2015-11-02 MED ORDER — HYDROCHLOROTHIAZIDE 25 MG PO TABS
25.0000 mg | ORAL_TABLET | Freq: Every day | ORAL | Status: DC
  Administered 2015-11-03 – 2015-11-05 (×3): 25 mg via ORAL
  Filled 2015-11-02 (×3): qty 1

## 2015-11-02 MED ORDER — FOLIC ACID 1 MG PO TABS
1.0000 mg | ORAL_TABLET | Freq: Every day | ORAL | Status: DC
  Administered 2015-11-02 – 2015-11-05 (×4): 1 mg via ORAL
  Filled 2015-11-02 (×4): qty 1

## 2015-11-02 MED ORDER — ONDANSETRON HCL 4 MG/2ML IJ SOLN: 4.0000 mg | Freq: Four times a day (QID) | INTRAMUSCULAR | Status: DC | PRN

## 2015-11-02 MED ORDER — VITAMIN B-1 100 MG PO TABS
100.0000 mg | ORAL_TABLET | Freq: Every day | ORAL | Status: DC
  Administered 2015-11-02 – 2015-11-05 (×4): 100 mg via ORAL
  Filled 2015-11-02 (×4): qty 1

## 2015-11-02 MED ORDER — ACETAMINOPHEN 650 MG RE SUPP: 650.0000 mg | Freq: Four times a day (QID) | RECTAL | Status: DC | PRN

## 2015-11-02 MED ORDER — POTASSIUM CHLORIDE IN NACL 20-0.9 MEQ/L-% IV SOLN
INTRAVENOUS | Status: AC
  Administered 2015-11-02: via INTRAVENOUS
  Filled 2015-11-02: qty 1000

## 2015-11-02 MED ORDER — ADULT MULTIVITAMIN W/MINERALS CH
1.0000 | ORAL_TABLET | Freq: Every day | ORAL | Status: DC
  Administered 2015-11-02 – 2015-11-04 (×3): 1 via ORAL
  Filled 2015-11-02 (×3): qty 1

## 2015-11-02 NOTE — ED Notes (Signed)
Phlebotomy at bedside at this time.

## 2015-11-02 NOTE — ED Notes (Signed)
Attempted to call report x 1  

## 2015-11-02 NOTE — ED Notes (Signed)
Dr. Wickline at the bedside.  

## 2015-11-02 NOTE — H&P (Signed)
History and Physical    Ann Goodwin [id number] DOB: [birth year] DOA: 11/02/2015  PCP: Benito Mccreedy, MD   Patient coming from: Home   Chief Complaint: LLE swelling, redness, pain   HPI: Ann Goodwin is a 75 y.o. female with medical history significant for hypertension, chronic venous stasis, and alcohol dependence who presents to the emergency department for evaluation of left lower extremity swelling, tenderness, and erythema. The patient reports that she has had these symptoms for the past 2-3 months, has been treated with oral doxycycline for suspected cellulitis, but quickly experienced a re-worsening once the course was completed. Patient has history of chronic venous stasis with ulcer at the lateral aspect of the distal LLE that has not healed. She denies any recent fevers, chills, chest pain, or palpitations. She reports strict adherence with her antibiotics. She notes marked increase in swelling, erythema, and pain over the past 3 or 4 days, and reports purulent drainage from the wound at the lateral distal LLE. The patient denies any other rash or wound. She endorses continued daily alcohol use, usually 4 beers per day. She denies any history of withdrawal symptoms or seizures. There has been no trauma to the involved leg and the patient denies any personal or family history of VTE.  ED Course: Upon arrival to the ED, patient is found to be afebrile, saturating well on room air, and with vital signs stable. Chest x-ray was obtained and negative for acute cardiopulmonary disease. Chemistry panel features a mild hypokalemia CBC is unremarkable. Lactic acid is reassuring at 1.51 and urinalysis is unremarkable. Blood and urine cultures were obtained in the emergency department and the patient was treated with empiric vancomycin for suspected purulent cellulitis. Radiographs of the left lower extremity have been reviewed and findings are consistent with soft tissue infection, but no  gas, foreign-body, or evidence for osteomyelitis is identified. Patient has remained afebrile and hemodynamically stable in the emergency department. She will be observed on the medical-surgical unit for ongoing evaluation and management of purulent cellulitis involving the distal left lower extremity.  Review of Systems:  All other systems reviewed and apart from HPI, are negative.  Past Medical History:  Diagnosis Date  . Hypertension     Past Surgical History:  Procedure Laterality Date  . ABDOMINAL HYSTERECTOMY       reports that she has been smoking Cigarettes.  She has been smoking about 0.50 packs per day. She has never used smokeless tobacco. She reports that she drinks about 1.2 oz of alcohol per week . She reports that she does not use drugs.  No Known Allergies  History reviewed. No pertinent family history.   Prior to Admission medications   Medication Sig Start Date End Date Taking? Authorizing Provider  doxycycline (VIBRAMYCIN) 100 MG capsule Take 1 capsule (100 mg total) by mouth 2 (two) times daily. One po bid x 7 days 10/15/15  Yes Elnora Morrison, MD  furosemide (LASIX) 20 MG tablet Take 20 mg by mouth daily.   Yes Historical Provider, MD  hydrochlorothiazide (HYDRODIURIL) 25 MG tablet Take 25 mg by mouth daily.   Yes Historical Provider, MD  naproxen sodium (ANAPROX) 220 MG tablet Take 220 mg by mouth daily as needed (pain).    Yes Historical Provider, MD    Physical Exam: Vitals:   11/02/15 2045 11/02/15 2115 11/02/15 2130 11/02/15 2145  BP: 121/69 112/86 126/67 130/75  Pulse: 65 77 65 73  Resp:  18    Temp:  TempSrc:      SpO2: 99% 99% 98% 99%  Weight:      Height:          Constitutional: NAD, calm, comfortable Eyes: PERTLA, lids and conjunctivae normal ENMT: Mucous membranes are moist. Posterior pharynx clear of any exudate or lesions.   Neck: normal, supple, no masses, no thyromegaly Respiratory: clear to auscultation bilaterally, no wheezing,  no crackles. Normal respiratory effort.   Cardiovascular: S1 & S2 heard, regular rate and rhythm, no significant murmur. No carotid bruits. No significant JVD. Abdomen: No distension, no tenderness, no masses palpated. Bowel sounds normal.  Musculoskeletal: no clubbing / cyanosis. Distal LLE edematous, erythematous, and exquisitely tender from ankle to just below knee; ulcer at lateral aspect with purulent drainage. Normal muscle tone.  Skin: Aside from LLE findings above, no significant rashes, lesions, ulcers. Warm, dry, well-perfused. Neurologic: CN 2-12 grossly intact. Sensation intact, DTR normal. Strength 5/5 in all 4 limbs.  Psychiatric: Normal judgment and insight. Alert and oriented x 3. Normal mood and affect.     Labs on Admission: I have personally reviewed following labs and imaging studies  CBC:  Recent Labs Lab 11/02/15 1550  WBC 6.5  NEUTROABS 4.1  HGB 12.7  HCT 38.1  MCV 98.2  PLT [id number]   Basic Metabolic Panel:  Recent Labs Lab 11/02/15 1550  NA 137  K 3.4*  CL 105  CO2 23  GLUCOSE 103*  BUN 9  CREATININE 0.68  CALCIUM 9.3   GFR: Estimated Creatinine Clearance: 56.4 mL/min (by C-G formula based on SCr of 0.68 mg/dL). Liver Function Tests:  Recent Labs Lab 11/02/15 1550  AST 19  ALT 11*  ALKPHOS 83  BILITOT 0.6  PROT 7.6  ALBUMIN 3.5   No results for input(s): LIPASE, AMYLASE in the last 168 hours. No results for input(s): AMMONIA in the last 168 hours. Coagulation Profile: No results for input(s): INR, PROTIME in the last 168 hours. Cardiac Enzymes: No results for input(s): CKTOTAL, CKMB, CKMBINDEX, TROPONINI in the last 168 hours. BNP (last 3 results) No results for input(s): PROBNP in the last 8760 hours. HbA1C: No results for input(s): HGBA1C in the last 72 hours. CBG: No results for input(s): GLUCAP in the last 168 hours. Lipid Profile: No results for input(s): CHOL, HDL, LDLCALC, TRIG, CHOLHDL, LDLDIRECT in the last 72  hours. Thyroid Function Tests: No results for input(s): TSH, T4TOTAL, FREET4, T3FREE, THYROIDAB in the last 72 hours. Anemia Panel: No results for input(s): VITAMINB12, FOLATE, FERRITIN, TIBC, IRON, RETICCTPCT in the last 72 hours. Urine analysis:    Component Value Date/Time   COLORURINE YELLOW 11/02/2015 2017   APPEARANCEUR CLOUDY (A) 11/02/2015 2017   LABSPEC 1.021 11/02/2015 2017   PHURINE 5.0 11/02/2015 2017   GLUCOSEU NEGATIVE 11/02/2015 2017   HGBUR NEGATIVE 11/02/2015 2017   BILIRUBINUR NEGATIVE 11/02/2015 2017   KETONESUR NEGATIVE 11/02/2015 2017   PROTEINUR NEGATIVE 11/02/2015 2017   UROBILINOGEN 1.0 09/21/2012 2140   NITRITE NEGATIVE 11/02/2015 2017   LEUKOCYTESUR NEGATIVE 11/02/2015 2017   Sepsis Labs: @LABRCNTIP (procalcitonin:4,lacticidven:4) )No results found for this or any previous visit (from the past 240 hour(s)).   Radiological Exams on Admission: Dg Chest 2 View  Result Date: 11/02/2015 CLINICAL DATA:  Sepsis. EXAM: CHEST  2 VIEW COMPARISON:  08/04/2015 FINDINGS: Lungs are adequately inflated with minimal linear atelectasis/scarring over the left base. No consolidation or effusion. Borderline stable cardiomegaly. Minimal calcified plaque over the aortic arch. Remainder of the exam is unchanged. IMPRESSION: No acute  cardiopulmonary disease. Electronically Signed   By: Marin Olp M.D.   On: 11/02/2015 16:56   Dg Tibia/fibula Left  Result Date: 11/02/2015 CLINICAL DATA:  Patient with an open and draining wound in the mid left lower leg for 3 weeks. Initial encounter. EXAM: LEFT TIBIA AND FIBULA - 2 VIEW COMPARISON:  None. FINDINGS: There appears to be focal soft tissue swelling along the lateral aspect of the lower leg distally which may represent the patient's wound. No radiopaque foreign body or soft tissue gas collection is identified. No bony destructive change or periosteal reaction is seen. Chondrocalcinosis about the knee is noted. IMPRESSION: Soft tissue  wound of the left lower leg without soft tissue gas, foreign body or evidence of osteomyelitis. Electronically Signed   By: Inge Rise M.D.   On: 11/02/2015 16:56    EKG: Not performed, will obtain as appropriate.   Assessment/Plan  1. LLE cellulitis  - Likely a secondary infection of non-healing stasis ulcer  - Initially improved some with doxycycline before worsening   - Blood cultures are incubating; no prior culture data or MRSA screen in EMR  - Treated with empiric vancomycin in ED, will continue for now   - Calf is taught with edema and venous doppler requested to exclude underlying DVT   2. Chronic venous stasis    - Has been managed with Lasix 20 mg daily  - Discussed elevation, compression stockings - Wound care requested for non-healing stasis ulcer at lateral aspect of distal LLE    3. Alcohol dependence  - No suggestion of acute intoxication or withdrawal at time of admission  - Monitor with CIWA and prn Ativan    4. Hypertension - At goal currently, managed with Lasix and HCTZ at home, will continue   5. Hypokalemia  - Serum potassium 3.4 on admission, likely secondary to the diuretics  - Given 20 mEq oral potassium  - Mag level pending, replete prn  - Repeat chem panel in am     DVT prophylaxis: sq Lovenox  Code Status: Full  Family Communication: Discussed with patient Disposition Plan: Observe on med-surg Consults called: None Admission status: Observation     Vianne Bulls, MD [facility] Pager [phone]  If 7PM-7AM, please contact night-coverage [email or web address] Password [email or web address]  11/02/2015, 10:07 PM

## 2015-11-02 NOTE — ED Triage Notes (Signed)
Patient complains of left lower leg pain for 2 -3 months that has been treated with po antibiotics. This week wound has increased in size with purulent drainage, increased swelling and pain/ patient alert and orinted

## 2015-11-02 NOTE — ED Provider Notes (Signed)
[facility] DEPT Provider Note   CSN: [id number] Arrival date & time: 11/02/15  1500     History   Chief Complaint Chief Complaint  Patient presents with  . Wound Infection    HPI NASIYA ELVIR is a 75 y.o. female.  The history is provided by the patient and a relative.  Wound Check  This is a new problem. The current episode started more than 1 week ago. The problem occurs daily. The problem has been gradually worsening. Pertinent negatives include no abdominal pain. Nothing aggravates the symptoms. Nothing relieves the symptoms. Treatments tried: antibiotics. The treatment provided no relief.  patient reports she has had wound to left LE for several months No recent trauma Over past several days the wound has worsened with drainage Seen by PCP yesterday and started on doxycycline but no improvement She has no other complaints - she denies fever/vomiting   Past Medical History:  Diagnosis Date  . Hypertension     Patient Active Problem List   Diagnosis Date Noted  . Cellulitis 11/02/2015  . Hypertension 11/02/2015  . Alcohol dependence ([place]) 11/02/2015  . Hypokalemia 11/02/2015  . Chest pain 06/01/2013    Past Surgical History:  Procedure Laterality Date  . ABDOMINAL HYSTERECTOMY      OB History    No data available       Home Medications    Prior to Admission medications   Medication Sig Start Date End Date Taking? Authorizing Provider  doxycycline (VIBRAMYCIN) 100 MG capsule Take 1 capsule (100 mg total) by mouth 2 (two) times daily. One po bid x 7 days 10/15/15  Yes Elnora Morrison, MD  furosemide (LASIX) 20 MG tablet Take 20 mg by mouth daily.   Yes Historical Provider, MD  hydrochlorothiazide (HYDRODIURIL) 25 MG tablet Take 25 mg by mouth daily.   Yes Historical Provider, MD  naproxen sodium (ANAPROX) 220 MG tablet Take 220 mg by mouth daily as needed (pain).    Yes Historical Provider, MD    Family History History reviewed. No pertinent family  history.  Social History Social History  Substance Use Topics  . Smoking status: Current Every Day Smoker    Packs/day: 0.50    Types: Cigarettes  . Smokeless tobacco: Never Used  . Alcohol use 1.2 oz/week    2 Cans of beer per week     Comment: 2 or 3 40 oz every day     Allergies   Review of patient's allergies indicates no known allergies.   Review of Systems Review of Systems  Constitutional: Negative for fever.  Gastrointestinal: Negative for abdominal pain and vomiting.  Skin: Positive for wound.  All other systems reviewed and are negative.    Physical Exam Updated Vital Signs BP 130/75   Pulse 73   Temp 99.3 F (37.4 C) (Oral)   Resp 18   Ht 5\' 5"  (1.651 m)   Wt 61.2 kg   SpO2 99%   BMI 22.47 kg/m   Physical Exam CONSTITUTIONAL: Well developed/well nourished HEAD: Normocephalic/atraumatic EYES: EOMI ENMT: Mucous membranes moist NECK: supple no meningeal signs CV: S1/S2 noted, no murmurs/rubs/gallops noted LUNGS: Lungs are clear to auscultation bilaterally ABDOMEN: soft, nontender GU:no cva tenderness NEURO: Pt is awake/alert/appropriate, moves all extremitiesx4.  No facial droop.   EXTREMITIES: pulses normal/equal, full ROM, large wound to left LE.calf with erythema/tenderness and mild drainage.  The erythema is extending proximally.  No crepitus is noted.   SKIN: warm, color normal PSYCH: no abnormalities of mood  noted, alert and oriented to situation   ED Treatments / Results  Labs (all labs ordered are listed, but only abnormal results are displayed) Labs Reviewed  COMPREHENSIVE METABOLIC PANEL - Abnormal; Notable for the following:       Result Value   Potassium 3.4 (*)    Glucose, Bld 103 (*)    ALT 11 (*)    All other components within normal limits  URINALYSIS, ROUTINE W REFLEX MICROSCOPIC (NOT AT [facility]) - Abnormal; Notable for the following:    APPearance CLOUDY (*)    All other components within normal limits  CULTURE, BLOOD  (ROUTINE X 2)  CULTURE, BLOOD (ROUTINE X 2)  URINE CULTURE  CBC WITH DIFFERENTIAL/PLATELET  I-STAT BETA HCG BLOOD, ED ([facility], [facility], [facility] ONLY)  I-STAT CG4 LACTIC ACID, ED  I-STAT CG4 LACTIC ACID, ED    EKG  EKG Interpretation None       Radiology Dg Chest 2 View  Result Date: 11/02/2015 CLINICAL DATA:  Sepsis. EXAM: CHEST  2 VIEW COMPARISON:  08/04/2015 FINDINGS: Lungs are adequately inflated with minimal linear atelectasis/scarring over the left base. No consolidation or effusion. Borderline stable cardiomegaly. Minimal calcified plaque over the aortic arch. Remainder of the exam is unchanged. IMPRESSION: No acute cardiopulmonary disease. Electronically Signed   By: Marin Olp M.D.   On: 11/02/2015 16:56   Dg Tibia/fibula Left  Result Date: 11/02/2015 CLINICAL DATA:  Patient with an open and draining wound in the mid left lower leg for 3 weeks. Initial encounter. EXAM: LEFT TIBIA AND FIBULA - 2 VIEW COMPARISON:  None. FINDINGS: There appears to be focal soft tissue swelling along the lateral aspect of the lower leg distally which may represent the patient's wound. No radiopaque foreign body or soft tissue gas collection is identified. No bony destructive change or periosteal reaction is seen. Chondrocalcinosis about the knee is noted. IMPRESSION: Soft tissue wound of the left lower leg without soft tissue gas, foreign body or evidence of osteomyelitis. Electronically Signed   By: Inge Rise M.D.   On: 11/02/2015 16:56    Procedures Procedures (including critical care time)  Medications Ordered in ED Medications  vancomycin (VANCOCIN) IVPB 1000 mg/200 mL premix (1,000 mg Intravenous New Bag/Given 11/02/15 2150)     Initial Impression / Assessment and Plan / ED Course  I have reviewed the triage vital signs and the nursing notes.  Pertinent labs & imaging results that were available during my care of the patient were reviewed by me and considered in my medical decision making  (see chart for details).  Clinical Course    Pt with increased pain/redness/drainage despite home antibiotics Will admit D/w dr opyd for admission   Final Clinical Impressions(s) / ED Diagnoses   Final diagnoses:  Cellulitis of left lower extremity    New Prescriptions New Prescriptions   No medications on file     Ripley Fraise, MD 11/02/15 2201

## 2015-11-02 NOTE — Progress Notes (Signed)
Pharmacy Antibiotic Note  Ann Goodwin is a 75 y.o. female admitted on 11/02/2015 with cellulitis.  Pharmacy has been consulted for vancomycin dosing. Left leg pain with wound and purulent drainage. Has had pain for several months with failure of outpatient antibiotics (doxycycline listed). WBC 6.5, temp 99.3, lactic acid 1.51. SCr 0.68, CrCl estimated 55 mL/min.   Plan: Vancomycin 1g IV q24 hours Vancomycin trough goal 10-15 mcg/mL  Follow renal function, clinical status, culture results, and vancomycin trough as needed  Height: 5\' 5"  (165.1 cm) Weight: 135 lb (61.2 kg) IBW/kg (Calculated) : 57  Temp (24hrs), Avg:99.3 F (37.4 C), Min:99.3 F (37.4 C), Max:99.3 F (37.4 C)   Recent Labs Lab 11/02/15 1550 11/02/15 1603  WBC 6.5  --   CREATININE 0.68  --   LATICACIDVEN  --  1.51    Estimated Creatinine Clearance: 56.4 mL/min (by C-G formula based on SCr of 0.68 mg/dL).    No Known Allergies  Antimicrobials this admission: 9/13 Vanc >>   Dose adjustments this admission: N/A  Microbiology results: Pending   Thank you for allowing pharmacy to be a part of this patient's care.  Argie Ramming, PharmD Pharmacy Resident  Pager [phone] 11/02/15 10:42 PM

## 2015-11-02 NOTE — ED Notes (Addendum)
Delay in lab draw,  Pt just arriving back to room to room.

## 2015-11-03 ENCOUNTER — Inpatient Hospital Stay (HOSPITAL_COMMUNITY): Payer: Medicare Other

## 2015-11-03 DIAGNOSIS — I119 Hypertensive heart disease without heart failure: Secondary | ICD-10-CM | POA: Diagnosis present

## 2015-11-03 DIAGNOSIS — M79609 Pain in unspecified limb: Secondary | ICD-10-CM

## 2015-11-03 DIAGNOSIS — E876 Hypokalemia: Secondary | ICD-10-CM | POA: Diagnosis present

## 2015-11-03 DIAGNOSIS — E039 Hypothyroidism, unspecified: Secondary | ICD-10-CM | POA: Diagnosis present

## 2015-11-03 DIAGNOSIS — Z23 Encounter for immunization: Secondary | ICD-10-CM | POA: Diagnosis not present

## 2015-11-03 DIAGNOSIS — Z79899 Other long term (current) drug therapy: Secondary | ICD-10-CM | POA: Diagnosis not present

## 2015-11-03 DIAGNOSIS — L03116 Cellulitis of left lower limb: Secondary | ICD-10-CM | POA: Diagnosis present

## 2015-11-03 DIAGNOSIS — M7989 Other specified soft tissue disorders: Secondary | ICD-10-CM | POA: Diagnosis not present

## 2015-11-03 DIAGNOSIS — I8312 Varicose veins of left lower extremity with inflammation: Secondary | ICD-10-CM | POA: Diagnosis not present

## 2015-11-03 DIAGNOSIS — E785 Hyperlipidemia, unspecified: Secondary | ICD-10-CM | POA: Diagnosis present

## 2015-11-03 DIAGNOSIS — L97929 Non-pressure chronic ulcer of unspecified part of left lower leg with unspecified severity: Secondary | ICD-10-CM | POA: Diagnosis present

## 2015-11-03 DIAGNOSIS — I878 Other specified disorders of veins: Secondary | ICD-10-CM | POA: Diagnosis present

## 2015-11-03 DIAGNOSIS — F17210 Nicotine dependence, cigarettes, uncomplicated: Secondary | ICD-10-CM | POA: Diagnosis present

## 2015-11-03 LAB — BASIC METABOLIC PANEL
Anion gap: 6 (ref 5–15)
BUN: 7 mg/dL (ref 6–20)
CHLORIDE: 108 mmol/L (ref 101–111)
CO2: 27 mmol/L (ref 22–32)
CREATININE: 0.54 mg/dL (ref 0.44–1.00)
Calcium: 8.6 mg/dL — ABNORMAL LOW (ref 8.9–10.3)
GFR calc Af Amer: >60 mL/min (ref >60)
GLUCOSE: 104 mg/dL — AB (ref 65–99)
POTASSIUM: 3.7 mmol/L (ref 3.5–5.1)
SODIUM: 141 mmol/L (ref 135–145)

## 2015-11-03 MED ORDER — COLLAGENASE 250 UNIT/GM EX OINT
TOPICAL_OINTMENT | Freq: Every day | CUTANEOUS | Status: DC
  Administered 2015-11-04: 1 via TOPICAL
  Administered 2015-11-05: 09:00:00 via TOPICAL
  Filled 2015-11-03: qty 30

## 2015-11-03 MED ORDER — VANCOMYCIN HCL 10 G IV SOLR
1250.0000 mg | INTRAVENOUS | Status: DC
  Administered 2015-11-03: 1250 mg via INTRAVENOUS
  Filled 2015-11-03 (×2): qty 1250

## 2015-11-03 NOTE — Consult Note (Signed)
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  Pt reports using hydrogen peroxide to clean ulcer.  Instructed pt not to use hydrogen peroxide as it destroys new skin cells and stalls wound healing.  Once cellulitis has improved and ultrasound has been completed to rule out DVT, recommend gentle compression TED hose. Pressure Ulcer POA: NA Measurement: 4 x 4.5 Wound bed: 100% crusted, tan tissue/  Unable to assess wound base because of buildup of tissue. Drainage (amount, consistency, odor) moderate amount of thick, yellow, malodorous drainage Periwound: Edema with hyperkeratosis Dressing procedure/placement/frequency: Cleanse legs with warm water and soap daily.  Gently pat dry.  Apply daily moisturizer to bilateral legs.  Apply Santyl to ulcer wound base only.  Cover with foam dressing.  Change daily.  Elevate legs while in bed and when sitting.    Discussed POC with patient and bedside nurse.  Re consult if needed, will not follow at this time. Thanks Dorna Bloom BSN, RN, [facility]

## 2015-11-03 NOTE — Progress Notes (Signed)
Ann Goodwin [id number] DOB: 1940-11-22 DOA: 11/02/2015 PCP: Benito Mccreedy, MD  Brief narrative:  75 y/o ? Known ho htn hld Prior CP with a neg Lexiscan performed 05/2013 Daily ETOH ~4-6 beers/day Daily smoker Hypothyroidism  On/ff Rx for a couple of months wit chr cellulitis on LE on the L side  Past medical history-As per Problem list Chart reviewed as below-   Consultants:  none  Procedures:  none  Antibiotics:  Vancomycin 9/14   Subjective  Alert pleasant  Some pain in the LLE and increased erythema States has been seen by a podiatrist for toes, but hasn't had the leg looked at per se. Doesn;t reember what leg was last rx with   Objective    Interim History:   Telemetry:    Objective: Vitals:   11/02/15 2245 11/02/15 2334 11/03/15 0622 11/03/15 0628  BP: 132/82 131/67  139/66  Pulse: 74 64  64  Resp: 20 17  16   Temp:  97.7 F (36.5 C)  97.9 F (36.6 C)  TempSrc:  Oral    SpO2: 100% 100%  100%  Weight:   70.9 kg (156 lb 3.2 oz)   Height:  5\' 5"  (1.651 m)      Intake/Output Summary (Last 24 hours) at 11/03/15 0744 Last data filed at 11/03/15 [id number]  Gross per 24 hour  Intake          1081.67 ml  Output             1000 ml  Net            81.67 ml    Exam:  General: eom, ncat Cardiovascular: s1s2 no m/r/g, rrr Respiratory: clear no added sound Abdomen: soft nt nd no rebound Skin onychogryphosis L + R side, L outer calf shows significant redness and feels warm to touch, oozing from the center with a circumferential 4 cm wide and mild ooze Neuro intact, moving all 4  limb  Data Reviewed: Basic Metabolic Panel:  Recent Labs Lab 11/02/15 1550 11/02/15 2225  NA 137  --   K 3.4*  --   CL 105  --   CO2 23  --   GLUCOSE 103*  --   BUN 9  --   CREATININE 0.68  --   CALCIUM 9.3  --   MG  --  1.7   Liver Function Tests:  Recent Labs Lab 11/02/15 1550  AST 19  ALT 11*  ALKPHOS 83  BILITOT 0.6  PROT 7.6  ALBUMIN  3.5   No results for input(s): LIPASE, AMYLASE in the last 168 hours. No results for input(s): AMMONIA in the last 168 hours. CBC:  Recent Labs Lab 11/02/15 1550  WBC 6.5  NEUTROABS 4.1  HGB 12.7  HCT 38.1  MCV 98.2  PLT 222   Cardiac Enzymes: No results for input(s): CKTOTAL, CKMB, CKMBINDEX, TROPONINI in the last 168 hours. BNP: Invalid input(s): POCBNP CBG: No results for input(s): GLUCAP in the last 168 hours.  No results found for this or any previous visit (from the past 240 hour(s)).   Studies:              All Imaging reviewed and is as per above notation   Scheduled Meds: . enoxaparin (LOVENOX) injection  40 mg Subcutaneous Q24H  . folic acid  1 mg Oral Daily  . furosemide  20 mg Oral Daily  . hydrochlorothiazide  25 mg Oral Daily  . Influenza vac split quadrivalent PF  0.5 mL Intramuscular Tomorrow-1000  . LORazepam  0-4 mg Oral Q6H   Followed by  . [START ON 11/04/2015] LORazepam  0-4 mg Oral Q12H  . multivitamin with minerals  1 tablet Oral Daily  . thiamine  100 mg Oral Daily   Or  . thiamine  100 mg Intravenous Daily  . vancomycin  1,000 mg Intravenous Q24H   Continuous Infusions: . 0.9 % NaCl with KCl 20 mEq / L 50 mL/hr at 11/02/15 2356     Assessment/Plan:  1. LLE cellulitis-continue Vancomycin.  De-escalate expectedly in 36-48 hrs.  Rpt cbc + diff.  Await DVT r/o with USG. 2. htn-cont hctz 25 mg daily, Lasix 20 daily 3. Daily smoker-nicotine patch ordered, counseled re: cessation 4. Prior neg lexiscan 5. Hypothryoidism-not on thyroxine?  Rpt as OP TSH 6. Chr ETOH-Follow CIWA, d/c ativan if no signs acute withdrawal within 48 hr  D/w son at bedside briefly Will need at least 24-48 hr more IV abx, given risk of clinical worsening, would change to IP statyus   Verneita Griffes, MD  [facility] Pager [phone] 11/03/2015, 7:44 AM    LOS: 0 days

## 2015-11-03 NOTE — Care Management Obs Status (Signed)
[place] NOTIFICATION   Patient Details  Name: Ann Goodwin MRN: [id number] Date of Birth: 1940/12/30   Medicare Observation Status Notification Given:  Yes    Carles Collet, RN 11/03/2015, 11:54 AM

## 2015-11-03 NOTE — Progress Notes (Signed)
*  Preliminary Results* Left lower extremity venous duplex completed. Left lower extremity is negative for deep vein thrombosis. There is no evidence of left Baker's cyst.  Incidental finding: there are large heterogenous areas in bilateral groins, largest measuring 4cm, suggestive of possible enlarged inguinal lymph nodes.  11/03/2015 3:38 PM  Maudry Mayhew, BS, RVT, RDCS, RDMS

## 2015-11-03 NOTE — Progress Notes (Signed)
Patient/Family oriented to room. Information packet given to patient/family. Admission inpatient armband information verified with patient/family to include name and date of birth and placed on patient arm. Side rails up x 2, fall assessment and education completed with patient/family. Call light within reach. Patient/family able to voice and demonstrate understanding of unit orientation instructions  

## 2015-11-03 NOTE — Care Management Note (Signed)
Case Management Note  Patient Details  Name: KATHELINE HIPKINS MRN: [id number] Date of Birth: [birth year]  Subjective/Objective:                 Patient in obs from home with son, weeping cellulitis, IV Abx. Patient does not use DME and is not active w/ HH. Pharmacy [facility] [address].    Action/Plan:  CM will continue to follow for DC needs.  Expected Discharge Date:                  Expected Discharge Plan:  Home/Self Care  In-House Referral:  NA  Discharge planning Services  CM Consult  Post Acute Care Choice:    Choice offered to:     DME Arranged:    DME Agency:     HH Arranged:    HH Agency:     Status of Service:  In process, will continue to follow  If discussed at Long Length of Stay Meetings, dates discussed:    Additional Comments:  Carles Collet, RN 11/03/2015, 11:51 AM

## 2015-11-04 LAB — CBC WITH DIFFERENTIAL/PLATELET
BASOS ABS: 0 10*3/uL (ref 0.0–0.1)
BASOS PCT: 0 %
Eosinophils Absolute: 0.2 10*3/uL (ref 0.0–0.7)
Eosinophils Relative: 3 %
HEMATOCRIT: 38.8 % (ref 36.0–46.0)
HEMOGLOBIN: 12.6 g/dL (ref 12.0–15.0)
LYMPHS PCT: 20 %
Lymphs Abs: 1.3 10*3/uL (ref 0.7–4.0)
MCH: 31.7 pg (ref 26.0–34.0)
MCHC: 32.5 g/dL (ref 30.0–36.0)
MCV: 97.7 fL (ref 78.0–100.0)
MONO ABS: 0.7 10*3/uL (ref 0.1–1.0)
MONOS PCT: 11 %
NEUTROS ABS: 4.3 10*3/uL (ref 1.7–7.7)
Neutrophils Relative %: 66 %
Platelets: 229 10*3/uL (ref 150–400)
RBC: 3.97 MIL/uL (ref 3.87–5.11)
RDW: 13.4 % (ref 11.5–15.5)
WBC: 6.5 10*3/uL (ref 4.0–10.5)

## 2015-11-04 LAB — HIV ANTIBODY (ROUTINE TESTING W REFLEX): HIV SCREEN 4TH GENERATION: NONREACTIVE

## 2015-11-04 LAB — BASIC METABOLIC PANEL
ANION GAP: 7 (ref 5–15)
BUN: 8 mg/dL (ref 6–20)
CALCIUM: 9.2 mg/dL (ref 8.9–10.3)
CHLORIDE: 104 mmol/L (ref 101–111)
CO2: 26 mmol/L (ref 22–32)
Creatinine, Ser: 0.43 mg/dL — ABNORMAL LOW (ref 0.44–1.00)
GFR calc non Af Amer: >60 mL/min (ref >60)
Glucose, Bld: 100 mg/dL — ABNORMAL HIGH (ref 65–99)
POTASSIUM: 3.5 mmol/L (ref 3.5–5.1)
Sodium: 137 mmol/L (ref 135–145)

## 2015-11-04 LAB — GLUCOSE, CAPILLARY: Glucose-Capillary: 98 mg/dL (ref 65–99)

## 2015-11-04 LAB — URINE CULTURE

## 2015-11-04 MED ORDER — DOXYCYCLINE HYCLATE 100 MG PO TABS
100.0000 mg | ORAL_TABLET | Freq: Two times a day (BID) | ORAL | Status: DC
  Administered 2015-11-04 – 2015-11-05 (×3): 100 mg via ORAL
  Filled 2015-11-04 (×3): qty 1

## 2015-11-04 MED ORDER — ADULT MULTIVITAMIN W/MINERALS CH: 1.0000 | ORAL_TABLET | ORAL | Status: DC

## 2015-11-04 MED ORDER — FAMOTIDINE 20 MG PO TABS
20.0000 mg | ORAL_TABLET | Freq: Every day | ORAL | Status: DC
  Administered 2015-11-04 – 2015-11-05 (×2): 20 mg via ORAL
  Filled 2015-11-04 (×2): qty 1

## 2015-11-04 NOTE — Consult Note (Signed)
            [facility] CM Primary Care Navigator  11/04/2015  HEMA MCALEESE 1940/08/18 [id number]  Patient seen at the bedside to identify possible discharge needs. She states that swelling, increased pain and drainage to left lower extremity led to this admission. Patient identified Dr. Emilee Hero- Bonsu as the primary care provider.    Patient states [facility] in [place] to obtain her medications and son fixes them in a pillbox for her to take.    Transportation to doctors' appointments is being provided by a friend of the family Miguel Dibble). Sons Jori Moll and [place]) serves as her caregiver at home.   Patient voiced understanding to call primary care provider's office to schedule post discharge follow-up appointment within a week or sooner if needs arise. Patient letter given as a reminder.  She states that needs and care are provided by sons at home.    For additional questions please contact:  Edwena Felty A. Kade Rickels, BSN, RN-BC [facility] PRIMARY CARE Navigator Cell: [phone]

## 2015-11-04 NOTE — Progress Notes (Signed)
Ann Goodwin [id number] DOB: 1940-10-07 DOA: 11/02/2015 PCP: Benito Mccreedy, MD  Brief narrative:  75 y/o ? Known ho htn hld Prior CP with a neg Lexiscan performed 05/2013 Daily ETOH ~4-6 beers/day Daily smoker Hypothyroidism  On/Off Rx for a couple of months wit chr cellulitis on LE on the L side  Past medical history-As per Problem list Chart reviewed as below-   Consultants:  none  Procedures:  none  Antibiotics:  Vancomycin 9/14   Subjective   Doing fair. Pains in LE's a little Tolerating diet well No nausea no vomiting Tells me her level of function that she is able to ambulate to some extent about 5-10 feet but no more than that and that is her baseline   Objective    Interim History:   Telemetry:    Objective: Vitals:   11/03/15 0942 11/03/15 1419 11/03/15 2235 11/04/15 0645  BP: 134/64 136/82 114/66 119/64  Pulse: 67 76 60 (!) 57  Resp:  20 16 16   Temp:  98.9 F (37.2 C) 98.2 F (36.8 C) 98.1 F (36.7 C)  TempSrc:  Oral Oral Oral  SpO2: 98% 95% 95% 98%  Weight:    68.7 kg (151 lb 7.3 oz)  Height:        Intake/Output Summary (Last 24 hours) at 11/04/15 1250 Last data filed at 11/04/15 1059  Gross per 24 hour  Intake              490 ml  Output             1420 ml  Net             -930 ml    Exam:  General: eom, ncat Cardiovascular: s1s2 no m/r/g, rrr Respiratory: clear no added sound Abdomen: soft nt nd no rebound Skin onychogryphosis L + R side, L outer calf With occlusive dressing, oozing from the center with a circumferential 4 cm wide  Neuro intact, moving all 4  limb  Data Reviewed: Basic Metabolic Panel:  Recent Labs Lab 11/02/15 1550 11/02/15 2225 11/03/15 0739 11/04/15 0833  NA 137  --  141 137  K 3.4*  --  3.7 3.5  CL 105  --  108 104  CO2 23  --  27 26  GLUCOSE 103*  --  104* 100*  BUN 9  --  7 8  CREATININE 0.68  --  0.54 0.43*  CALCIUM 9.3  --  8.6* 9.2  MG  --  1.7  --   --    Liver  Function Tests:  Recent Labs Lab 11/02/15 1550  AST 19  ALT 11*  ALKPHOS 83  BILITOT 0.6  PROT 7.6  ALBUMIN 3.5   No results for input(s): LIPASE, AMYLASE in the last 168 hours. No results for input(s): AMMONIA in the last 168 hours. CBC:  Recent Labs Lab 11/02/15 1550 11/04/15 0833  WBC 6.5 6.5  NEUTROABS 4.1 4.3  HGB 12.7 12.6  HCT 38.1 38.8  MCV 98.2 97.7  PLT 222 229   Cardiac Enzymes: No results for input(s): CKTOTAL, CKMB, CKMBINDEX, TROPONINI in the last 168 hours. BNP: Invalid input(s): POCBNP CBG:  Recent Labs Lab 11/04/15 0815  GLUCAP 98    Recent Results (from the past 240 hour(s))  Culture, blood (Routine x 2)     Status: None (Preliminary result)   Collection Time: 11/02/15  3:50 PM  Result Value Ref Range Status   Specimen Description BLOOD LEFT ARM  Final  Special Requests BOTTLES DRAWN AEROBIC AND ANAEROBIC 5CC  Final   Culture NO GROWTH < 24 HOURS  Final   Report Status PENDING  Incomplete  Culture, blood (Routine x 2)     Status: None (Preliminary result)   Collection Time: 11/02/15  7:55 PM  Result Value Ref Range Status   Specimen Description BLOOD RIGHT ANTECUBITAL  Final   Special Requests IN PEDIATRIC BOTTLE 4CC  Final   Culture NO GROWTH < 24 HOURS  Final   Report Status PENDING  Incomplete  Urine culture     Status: Abnormal   Collection Time: 11/02/15  8:17 PM  Result Value Ref Range Status   Specimen Description URINE, CLEAN CATCH  Final   Special Requests NONE  Final   Culture MULTIPLE SPECIES PRESENT, SUGGEST RECOLLECTION (A)  Final   Report Status 11/04/2015 FINAL  Final     Studies:              All Imaging reviewed and is as per above notation   Scheduled Meds: . collagenase   Topical Daily  . doxycycline  100 mg Oral Q12H  . enoxaparin (LOVENOX) injection  40 mg Subcutaneous Q24H  . famotidine  20 mg Oral Daily  . folic acid  1 mg Oral Daily  . furosemide  20 mg Oral Daily  . hydrochlorothiazide  25 mg Oral  Daily  . [START ON 11/05/2015] multivitamin with minerals  1 tablet Oral Q24H  . thiamine  100 mg Oral Daily   Or  . thiamine  100 mg Intravenous Daily   Continuous Infusions:     Assessment/Plan:  1. LLE cellulitis- initially placed on IV to by mouth vancomycin transitioned to doxycycline 9/15 DVT is negative based on ultrasound findings  Vancomycin.  Marland Kitchen  Rpt cbc + a.m. and if stabilized can probably discharge 11/04/68 , Lasix 20 daily 2. Daily smoker-nicotine patch ordered, counseled re: cessation 3. Prior neg lexiscan 4. Hypothryoidism-not on thyroxine?  Rpt as OP TSH 5. Chr ETOH- states last drink was over 2 weeks ago. d/c ativan    probable be able to discharge home 11/05/2015  D/w son at bedside briefly   Verneita Griffes, MD  [facility] Pager [phone] 11/04/2015, 12:50 PM    LOS: 1 day

## 2015-11-05 DIAGNOSIS — L03116 Cellulitis of left lower limb: Principal | ICD-10-CM

## 2015-11-05 DIAGNOSIS — I8312 Varicose veins of left lower extremity with inflammation: Secondary | ICD-10-CM

## 2015-11-05 LAB — CBC WITH DIFFERENTIAL/PLATELET
BASOS ABS: 0 10*3/uL (ref 0.0–0.1)
BASOS PCT: 0 %
EOS PCT: 4 %
Eosinophils Absolute: 0.2 10*3/uL (ref 0.0–0.7)
HCT: 38.5 % (ref 36.0–46.0)
Hemoglobin: 12.6 g/dL (ref 12.0–15.0)
Lymphocytes Relative: 27 %
Lymphs Abs: 1.7 10*3/uL (ref 0.7–4.0)
MCH: 32 pg (ref 26.0–34.0)
MCHC: 32.7 g/dL (ref 30.0–36.0)
MCV: 97.7 fL (ref 78.0–100.0)
MONO ABS: 0.6 10*3/uL (ref 0.1–1.0)
Monocytes Relative: 9 %
Neutro Abs: 3.8 10*3/uL (ref 1.7–7.7)
Neutrophils Relative %: 60 %
PLATELETS: 225 10*3/uL (ref 150–400)
RBC: 3.94 MIL/uL (ref 3.87–5.11)
RDW: 13.3 % (ref 11.5–15.5)
WBC: 6.3 10*3/uL (ref 4.0–10.5)

## 2015-11-05 LAB — RENAL FUNCTION PANEL
ALBUMIN: 3.2 g/dL — AB (ref 3.5–5.0)
Anion gap: 8 (ref 5–15)
BUN: 8 mg/dL (ref 6–20)
CHLORIDE: 101 mmol/L (ref 101–111)
CO2: 29 mmol/L (ref 22–32)
CREATININE: 0.62 mg/dL (ref 0.44–1.00)
Calcium: 9.3 mg/dL (ref 8.9–10.3)
GFR calc Af Amer: >60 mL/min (ref >60)
GLUCOSE: 119 mg/dL — AB (ref 65–99)
PHOSPHORUS: 4.8 mg/dL — AB (ref 2.5–4.6)
Potassium: 3.4 mmol/L — ABNORMAL LOW (ref 3.5–5.1)
Sodium: 138 mmol/L (ref 135–145)

## 2015-11-05 LAB — GLUCOSE, CAPILLARY: GLUCOSE-CAPILLARY: 98 mg/dL (ref 65–99)

## 2015-11-05 MED ORDER — COLLAGENASE 250 UNIT/GM EX OINT: TOPICAL_OINTMENT | Freq: Every day | CUTANEOUS | 0 refills | Status: DC

## 2015-11-05 MED ORDER — DOXYCYCLINE HYCLATE 100 MG PO CAPS: 100.0000 mg | ORAL_CAPSULE | Freq: Two times a day (BID) | ORAL | 0 refills | Status: DC

## 2015-11-05 NOTE — Care Management Note (Signed)
Case Management Note  Patient Details  Name: SECILIA APPS MRN: [id number] Date of Birth: 1940-07-03  Subjective/Objective:                  cellulitis Action/Plan: Discharge planning Expected Discharge Date:                  Expected Discharge Plan:  [place]  In-House Referral:     Discharge planning Services  CM Consult  Post Acute Care Choice:  Durable Medical Equipment, Home Health Choice offered to:  Patient  DME Arranged:  Walker rolling DME Agency:  [place] Arranged:  RN [facility] Agency:     Status of Service:  Completed, signed off  If discussed at IXL of Stay Meetings, dates discussed:    Additional Comments: CM met with pt in room to offer choice of home health agency.  Pt chooses [facility] to render [facility] for dressing wound.  Referral called to [facility] rep, Santiago Glad.  CM notes rolling walker in room and pt states her son will provide transportation.  CM relayed information to RN.  No other CM needs were communicated. Dellie Catholic, RN 11/05/2015, 1:24 PM

## 2015-11-05 NOTE — Discharge Summary (Signed)
Physician Discharge Summary  Ann Goodwin [id number] DOB: 1940-08-31 DOA: 11/02/2015  PCP: Benito Mccreedy, MD  Admit date: 11/02/2015 Discharge date: 11/05/2015  Time spent: 45 minutes  Recommendations for Outpatient Follow-up:  1. PCP in 1 week 2. Home health RN for Wound care   Discharge Diagnoses:  Principal Problem:   Cellulitis Active Problems:   Hypertension   Alcohol dependence ([place])   Hypokalemia   Chronic venous stasis dermatitis   Discharge Condition:stable  Diet recommendation: low sodium  Filed Weights   11/03/15 0622 11/04/15 0645 11/05/15 0532  Weight: 70.9 kg (156 lb 3.2 oz) 68.7 kg (151 lb 7.3 oz) 68.3 kg (150 lb 9.2 oz)    History of present illness:  Ann Goodwin is a 75 y.o. female with medical history significant for hypertension, chronic venous stasis, and alcohol dependence who presents to the emergency department for evaluation of left lower extremity swelling, tenderness, and erythema. The patient reports that she has had these symptoms for the past 2-3 months, has been treated with oral doxycycline for suspected cellulitis, but quickly experienced a re-worsening once the course was completed. Patient has history of chronic venous stasis with ulcer at the lateral aspect of the distal LLE that has not healed  Hospital Course:  1. LLE cellulitis- initially placed on IV Vancomycin and then transitioned to doxycycline , clinically improved, Dopplers were negative for DVT -Seen by Wound RN and recommendations given as follows: Cleanse legs with warm water and soap daily.  Gently pat dry.  Apply daily moisturizer to bilateral legs.  Apply Santyl to ulcer wound base only.  Cover with foam dressing.  Change daily.  Elevate legs while in bed and when sitting -discharged home on oral ABx in a stable condition  2. Daily smoker-nicotine patch ordered, counseled re: cessation  3. Hypothryoidism-not on thyroxine-please check this at FU  4. Chronic  ETOH use: no withdrawal noted, counseled  Discharge Exam: Vitals:   11/04/15 2108 11/05/15 0532  BP: 128/74 (!) 111/58  Pulse: 84 65  Resp: 16 16  Temp: 98.4 F (36.9 C) 98.2 F (36.8 C)    General: AAOx3 Cardiovascular: S1S2/RRR Respiratory: CTAB  Discharge Instructions   Discharge Instructions    Diet - low sodium heart healthy    Complete by:  As directed    Discharge instructions    Complete by:  As directed    Cleanse legs with warm water and soap daily.  Gently pat dry.  Apply daily moisturizer to bilateral legs.  Apply Santyl to ulcer wound base only.  Cover with foam dressing.  Change daily.  Elevate legs while in bed and when sitting   Increase activity slowly    Complete by:  As directed      Current Discharge Medication List    START taking these medications   Details  collagenase (SANTYL) ointment Apply topically daily. Qty: 15 g, Refills: 0      CONTINUE these medications which have CHANGED   Details  doxycycline (VIBRAMYCIN) 100 MG capsule Take 1 capsule (100 mg total) by mouth 2 (two) times daily. One po bid x 7 days Qty: 14 capsule, Refills: 0      CONTINUE these medications which have NOT CHANGED   Details  furosemide (LASIX) 20 MG tablet Take 20 mg by mouth daily.    hydrochlorothiazide (HYDRODIURIL) 25 MG tablet Take 25 mg by mouth daily.    naproxen sodium (ANAPROX) 220 MG tablet Take 220 mg by mouth daily as needed (pain).  No Known Allergies    The results of significant diagnostics from this hospitalization (including imaging, microbiology, ancillary and laboratory) are listed below for reference.    Significant Diagnostic Studies: Dg Chest 2 View  Result Date: 11/02/2015 CLINICAL DATA:  Sepsis. EXAM: CHEST  2 VIEW COMPARISON:  08/04/2015 FINDINGS: Lungs are adequately inflated with minimal linear atelectasis/scarring over the left base. No consolidation or effusion. Borderline stable cardiomegaly. Minimal calcified plaque  over the aortic arch. Remainder of the exam is unchanged. IMPRESSION: No acute cardiopulmonary disease. Electronically Signed   By: Marin Olp M.D.   On: 11/02/2015 16:56   Dg Tibia/fibula Left  Result Date: 11/02/2015 CLINICAL DATA:  Patient with an open and draining wound in the mid left lower leg for 3 weeks. Initial encounter. EXAM: LEFT TIBIA AND FIBULA - 2 VIEW COMPARISON:  None. FINDINGS: There appears to be focal soft tissue swelling along the lateral aspect of the lower leg distally which may represent the patient's wound. No radiopaque foreign body or soft tissue gas collection is identified. No bony destructive change or periosteal reaction is seen. Chondrocalcinosis about the knee is noted. IMPRESSION: Soft tissue wound of the left lower leg without soft tissue gas, foreign body or evidence of osteomyelitis. Electronically Signed   By: Inge Rise M.D.   On: 11/02/2015 16:56    Microbiology: Recent Results (from the past 240 hour(s))  Culture, blood (Routine x 2)     Status: None (Preliminary result)   Collection Time: 11/02/15  3:50 PM  Result Value Ref Range Status   Specimen Description BLOOD LEFT ARM  Final   Special Requests BOTTLES DRAWN AEROBIC AND ANAEROBIC 5CC  Final   Culture NO GROWTH 3 DAYS  Final   Report Status PENDING  Incomplete  Culture, blood (Routine x 2)     Status: None (Preliminary result)   Collection Time: 11/02/15  7:55 PM  Result Value Ref Range Status   Specimen Description BLOOD RIGHT ANTECUBITAL  Final   Special Requests IN PEDIATRIC BOTTLE 4CC  Final   Culture NO GROWTH 3 DAYS  Final   Report Status PENDING  Incomplete  Urine culture     Status: Abnormal   Collection Time: 11/02/15  8:17 PM  Result Value Ref Range Status   Specimen Description URINE, CLEAN CATCH  Final   Special Requests NONE  Final   Culture MULTIPLE SPECIES PRESENT, SUGGEST RECOLLECTION (A)  Final   Report Status 11/04/2015 FINAL  Final     Labs: Basic Metabolic  Panel:  Recent Labs Lab 11/02/15 1550 11/02/15 2225 11/03/15 0739 11/04/15 0833 11/05/15 0518  NA 137  --  141 137 138  K 3.4*  --  3.7 3.5 3.4*  CL 105  --  108 104 101  CO2 23  --  27 26 29   GLUCOSE 103*  --  104* 100* 119*  BUN 9  --  7 8 8   CREATININE 0.68  --  0.54 0.43* 0.62  CALCIUM 9.3  --  8.6* 9.2 9.3  MG  --  1.7  --   --   --   PHOS  --   --   --   --  4.8*   Liver Function Tests:  Recent Labs Lab 11/02/15 1550 11/05/15 0518  AST 19  --   ALT 11*  --   ALKPHOS 83  --   BILITOT 0.6  --   PROT 7.6  --   ALBUMIN 3.5 3.2*   No  results for input(s): LIPASE, AMYLASE in the last 168 hours. No results for input(s): AMMONIA in the last 168 hours. CBC:  Recent Labs Lab 11/02/15 1550 11/04/15 0833 11/05/15 0517  WBC 6.5 6.5 6.3  NEUTROABS 4.1 4.3 3.8  HGB 12.7 12.6 12.6  HCT 38.1 38.8 38.5  MCV 98.2 97.7 97.7  PLT 222 229 225   Cardiac Enzymes: No results for input(s): CKTOTAL, CKMB, CKMBINDEX, TROPONINI in the last 168 hours. BNP: BNP (last 3 results)  Recent Labs  08/04/15 1827  BNP 5.6    ProBNP (last 3 results) No results for input(s): PROBNP in the last 8760 hours.  CBG:  Recent Labs Lab 11/04/15 0815 11/05/15 0749  GLUCAP 98 98       Signed:  Deriona Altemose MD.  [facility] 11/05/2015, 1:20 PM

## 2015-11-05 NOTE — Progress Notes (Signed)
NURSING PROGRESS NOTE  HELAINE BURFIELD [id number] Discharge Data: 11/05/2015 2:03 PM Attending Provider: Domenic Polite, MD [id number], MD     Rod Holler to be D/C'd Home per MD order.  Discussed with the patient the After Visit Summary and all questions fully answered. All IV's discontinued with no bleeding noted. All belongings returned to patient for patient to take home. Home Health will be set up so she can get the wound care supplies. Pt was sent home with her walker.  Last Vital Signs:  Blood pressure (!) 111/58, pulse 65, temperature 98.2 F (36.8 C), temperature source Oral, resp. rate 16, height 5\' 5"  (1.651 m), weight 68.3 kg (150 lb 9.2 oz), SpO2 96 %.  Discharge Medication List   Medication List    TAKE these medications   collagenase ointment Commonly known as:  SANTYL Apply topically daily.   doxycycline 100 MG capsule Commonly known as:  VIBRAMYCIN Take 1 capsule (100 mg total) by mouth 2 (two) times daily. One po bid x 7 days   furosemide 20 MG tablet Commonly known as:  LASIX Take 20 mg by mouth daily.   hydrochlorothiazide 25 MG tablet Commonly known as:  HYDRODIURIL Take 25 mg by mouth daily.   naproxen sodium 220 MG tablet Commonly known as:  ANAPROX Take 220 mg by mouth daily as needed (pain).

## 2015-11-07 LAB — CULTURE, BLOOD (ROUTINE X 2)
CULTURE: NO GROWTH
CULTURE: NO GROWTH

## 2015-11-14 ENCOUNTER — Encounter (HOSPITAL_BASED_OUTPATIENT_CLINIC_OR_DEPARTMENT_OTHER): Payer: Medicare Other | Attending: Internal Medicine

## 2016-01-30 ENCOUNTER — Encounter (HOSPITAL_BASED_OUTPATIENT_CLINIC_OR_DEPARTMENT_OTHER): Payer: Medicare Other | Attending: Internal Medicine

## 2016-06-18 ENCOUNTER — Other Ambulatory Visit: Payer: Self-pay | Admitting: Physician Assistant

## 2016-06-20 ENCOUNTER — Other Ambulatory Visit: Payer: Self-pay | Admitting: Physician Assistant

## 2016-06-21 ENCOUNTER — Other Ambulatory Visit: Payer: Self-pay | Admitting: Physician Assistant

## 2016-06-21 DIAGNOSIS — M79605 Pain in left leg: Secondary | ICD-10-CM

## 2016-08-25 ENCOUNTER — Encounter (HOSPITAL_COMMUNITY): Payer: Self-pay | Admitting: Nurse Practitioner

## 2016-08-25 ENCOUNTER — Emergency Department (HOSPITAL_COMMUNITY): Payer: Medicare Other

## 2016-08-25 ENCOUNTER — Inpatient Hospital Stay (HOSPITAL_COMMUNITY)
Admission: EM | Admit: 2016-08-25 | Discharge: 2016-08-30 | DRG: 313 | Disposition: A | Discharge status: Skilled Nursing Facility | Payer: Medicare Other | Attending: Internal Medicine | Admitting: Internal Medicine

## 2016-08-25 DIAGNOSIS — B888 Other specified infestations: Secondary | ICD-10-CM | POA: Diagnosis present

## 2016-08-25 DIAGNOSIS — E785 Hyperlipidemia, unspecified: Secondary | ICD-10-CM | POA: Diagnosis present

## 2016-08-25 DIAGNOSIS — R748 Abnormal levels of other serum enzymes: Secondary | ICD-10-CM | POA: Diagnosis not present

## 2016-08-25 DIAGNOSIS — R600 Localized edema: Secondary | ICD-10-CM | POA: Diagnosis present

## 2016-08-25 DIAGNOSIS — K829 Disease of gallbladder, unspecified: Secondary | ICD-10-CM | POA: Diagnosis present

## 2016-08-25 DIAGNOSIS — I10 Essential (primary) hypertension: Secondary | ICD-10-CM | POA: Diagnosis present

## 2016-08-25 DIAGNOSIS — I872 Venous insufficiency (chronic) (peripheral): Secondary | ICD-10-CM | POA: Diagnosis present

## 2016-08-25 DIAGNOSIS — I2510 Atherosclerotic heart disease of native coronary artery without angina pectoris: Secondary | ICD-10-CM | POA: Diagnosis present

## 2016-08-25 DIAGNOSIS — F1020 Alcohol dependence, uncomplicated: Secondary | ICD-10-CM | POA: Diagnosis present

## 2016-08-25 DIAGNOSIS — E039 Hypothyroidism, unspecified: Secondary | ICD-10-CM | POA: Diagnosis present

## 2016-08-25 DIAGNOSIS — F17210 Nicotine dependence, cigarettes, uncomplicated: Secondary | ICD-10-CM | POA: Diagnosis present

## 2016-08-25 DIAGNOSIS — F1010 Alcohol abuse, uncomplicated: Secondary | ICD-10-CM | POA: Diagnosis not present

## 2016-08-25 DIAGNOSIS — E871 Hypo-osmolality and hyponatremia: Secondary | ICD-10-CM | POA: Diagnosis present

## 2016-08-25 DIAGNOSIS — N83209 Unspecified ovarian cyst, unspecified side: Secondary | ICD-10-CM | POA: Diagnosis present

## 2016-08-25 DIAGNOSIS — Z720 Tobacco use: Secondary | ICD-10-CM

## 2016-08-25 DIAGNOSIS — Z90710 Acquired absence of both cervix and uterus: Secondary | ICD-10-CM | POA: Diagnosis not present

## 2016-08-25 DIAGNOSIS — R072 Precordial pain: Secondary | ICD-10-CM

## 2016-08-25 DIAGNOSIS — R778 Other specified abnormalities of plasma proteins: Secondary | ICD-10-CM

## 2016-08-25 DIAGNOSIS — K828 Other specified diseases of gallbladder: Secondary | ICD-10-CM | POA: Diagnosis not present

## 2016-08-25 DIAGNOSIS — M25512 Pain in left shoulder: Secondary | ICD-10-CM | POA: Diagnosis present

## 2016-08-25 DIAGNOSIS — R079 Chest pain, unspecified: Secondary | ICD-10-CM | POA: Diagnosis present

## 2016-08-25 DIAGNOSIS — R109 Unspecified abdominal pain: Secondary | ICD-10-CM | POA: Diagnosis not present

## 2016-08-25 DIAGNOSIS — R634 Abnormal weight loss: Secondary | ICD-10-CM | POA: Diagnosis not present

## 2016-08-25 DIAGNOSIS — N838 Other noninflammatory disorders of ovary, fallopian tube and broad ligament: Secondary | ICD-10-CM | POA: Diagnosis not present

## 2016-08-25 DIAGNOSIS — K449 Diaphragmatic hernia without obstruction or gangrene: Secondary | ICD-10-CM | POA: Diagnosis present

## 2016-08-25 DIAGNOSIS — I77810 Thoracic aortic ectasia: Secondary | ICD-10-CM | POA: Diagnosis present

## 2016-08-25 DIAGNOSIS — I700 Atherosclerosis of aorta: Secondary | ICD-10-CM | POA: Diagnosis present

## 2016-08-25 DIAGNOSIS — R7989 Other specified abnormal findings of blood chemistry: Secondary | ICD-10-CM

## 2016-08-25 DIAGNOSIS — I878 Other specified disorders of veins: Secondary | ICD-10-CM | POA: Diagnosis present

## 2016-08-25 DIAGNOSIS — R0789 Other chest pain: Secondary | ICD-10-CM | POA: Diagnosis not present

## 2016-08-25 HISTORY — DX: Hyperlipidemia, unspecified: E78.5

## 2016-08-25 HISTORY — DX: Hypothyroidism, unspecified: E03.9

## 2016-08-25 HISTORY — DX: Thoracic aortic ectasia: I77.810

## 2016-08-25 HISTORY — DX: Other specified infestations: B88.8

## 2016-08-25 HISTORY — DX: Tobacco use: Z72.0

## 2016-08-25 HISTORY — DX: Edema, unspecified: R60.9

## 2016-08-25 HISTORY — DX: Other specified disorders of veins: I87.8

## 2016-08-25 HISTORY — DX: Alcohol abuse, uncomplicated: F10.10

## 2016-08-25 LAB — BASIC METABOLIC PANEL
ANION GAP: 8 (ref 5–15)
BUN: 5 mg/dL — ABNORMAL LOW (ref 6–20)
CALCIUM: 9.1 mg/dL (ref 8.9–10.3)
CO2: 26 mmol/L (ref 22–32)
Chloride: 104 mmol/L (ref 101–111)
Creatinine, Ser: 0.57 mg/dL (ref 0.44–1.00)
Glucose, Bld: 116 mg/dL — ABNORMAL HIGH (ref 65–99)
POTASSIUM: 3.4 mmol/L — AB (ref 3.5–5.1)
Sodium: 138 mmol/L (ref 135–145)

## 2016-08-25 LAB — CBC WITH DIFFERENTIAL/PLATELET
Basophils Absolute: 0 10*3/uL (ref 0.0–0.1)
Basophils Relative: 0 %
EOS ABS: 0.2 10*3/uL (ref 0.0–0.7)
Eosinophils Relative: 2 %
HCT: 40.1 % (ref 36.0–46.0)
Hemoglobin: 14 g/dL (ref 12.0–15.0)
LYMPHS ABS: 1.2 10*3/uL (ref 0.7–4.0)
Lymphocytes Relative: 10 %
MCH: 32.4 pg (ref 26.0–34.0)
MCHC: 34.9 g/dL (ref 30.0–36.0)
MCV: 92.8 fL (ref 78.0–100.0)
MONO ABS: 1.1 10*3/uL — AB (ref 0.1–1.0)
Monocytes Relative: 9 %
Neutro Abs: 9.2 10*3/uL — ABNORMAL HIGH (ref 1.7–7.7)
Neutrophils Relative %: 79 %
PLATELETS: 253 10*3/uL (ref 150–400)
RBC: 4.32 MIL/uL (ref 3.87–5.11)
RDW: 13.5 % (ref 11.5–15.5)
WBC: 11.7 10*3/uL — AB (ref 4.0–10.5)

## 2016-08-25 LAB — HEPATIC FUNCTION PANEL
ALT: 8 U/L — ABNORMAL LOW (ref 14–54)
AST: 17 U/L (ref 15–41)
Albumin: 3.8 g/dL (ref 3.5–5.0)
Alkaline Phosphatase: 94 U/L (ref 38–126)
BILIRUBIN DIRECT: 0.1 mg/dL (ref 0.1–0.5)
BILIRUBIN INDIRECT: 0.4 mg/dL (ref 0.3–0.9)
BILIRUBIN TOTAL: 0.5 mg/dL (ref 0.3–1.2)
Total Protein: 8.5 g/dL — ABNORMAL HIGH (ref 6.5–8.1)

## 2016-08-25 LAB — URINALYSIS, ROUTINE W REFLEX MICROSCOPIC
Bilirubin Urine: NEGATIVE
Glucose, UA: NEGATIVE mg/dL
Hgb urine dipstick: NEGATIVE
KETONES UR: NEGATIVE mg/dL
LEUKOCYTES UA: NEGATIVE
NITRITE: NEGATIVE
PH: 7 (ref 5.0–8.0)
PROTEIN: NEGATIVE mg/dL
Specific Gravity, Urine: 1.002 — ABNORMAL LOW (ref 1.005–1.030)

## 2016-08-25 LAB — RAPID URINE DRUG SCREEN, HOSP PERFORMED
AMPHETAMINES: NOT DETECTED
BENZODIAZEPINES: NOT DETECTED
Barbiturates: NOT DETECTED
COCAINE: NOT DETECTED
Opiates: NOT DETECTED
Tetrahydrocannabinol: NOT DETECTED

## 2016-08-25 LAB — ETHANOL

## 2016-08-25 LAB — TROPONIN I: TROPONIN I: 0.15 ng/mL — AB (ref <0.03)

## 2016-08-25 LAB — LIPASE, BLOOD: LIPASE: 14 U/L (ref 11–51)

## 2016-08-25 MED ORDER — ASPIRIN 81 MG PO CHEW
324.0000 mg | CHEWABLE_TABLET | Freq: Once | ORAL | Status: AC
  Administered 2016-08-26: 324 mg via ORAL
  Filled 2016-08-25: qty 4

## 2016-08-25 MED ORDER — POTASSIUM CHLORIDE CRYS ER 20 MEQ PO TBCR
40.0000 meq | EXTENDED_RELEASE_TABLET | Freq: Once | ORAL | Status: AC
  Administered 2016-08-26: 40 meq via ORAL
  Filled 2016-08-25: qty 2

## 2016-08-25 NOTE — ED Provider Notes (Signed)
[facility] DEPT Provider Note   CSN: [id number] Arrival date & time: 08/25/16  1928     History   Chief Complaint Chief Complaint  Patient presents with  . Weakness    HPI Ann Goodwin is a 76 y.o. female.  HPI Patient reports she has pain in her left shoulder. She reports this started yesterday. It has been constant. She denies any injury. It is worse with trying to raise her arm up. She reports she's also noticed some upper chest pain as well. Somewhat aching in nature. She reports she's felt a general ill but weak. No fever, no chill, no cough, no dyspnea. She denies abdominal pain nausea or vomiting. Past Medical History:  Diagnosis Date  . Hypertension     Patient Active Problem List   Diagnosis Date Noted  . NSTEMI (non-ST elevated myocardial infarction) ([place]) 08/25/2016  . Cellulitis 11/02/2015  . Hypertension 11/02/2015  . Alcohol dependence ([place]) 11/02/2015  . Hypokalemia 11/02/2015  . 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 Chest pain 06/01/2013    Past Surgical History:  Procedure Laterality Date  . ABDOMINAL HYSTERECTOMY      OB History    No data available       Home Medications    Prior to Admission medications   Medication Sig Start Date End Date Taking? Authorizing Provider  atorvastatin (LIPITOR) 10 MG tablet Take 10 mg by mouth at bedtime.   Yes [provider]  Cholecalciferol (VITAMIN D3) 5000 units CAPS Take 5,000 Units by mouth daily.   Yes [provider]  furosemide (LASIX) 20 MG tablet Take 20 mg by mouth daily.   Yes [provider]  hydrochlorothiazide (HYDRODIURIL) 12.5 MG tablet Take 12.5 mg by mouth daily.   Yes [provider]  levothyroxine (SYNTHROID, LEVOTHROID) 50 MCG tablet Take 50 mcg by mouth daily before breakfast.   Yes [provider]    Family History Family History  Problem Relation Age of Onset  . Deep vein thrombosis Neg Hx   . Pulmonary embolism Neg Hx      Social History Social History  Substance Use Topics  . Smoking status: Current Every Day Smoker    Packs/day: 0.50    Types: Cigarettes  . Smokeless tobacco: Never Used  . Alcohol use 1.2 oz/week    2 Cans of beer per week     Comment: 2 or 3 40 oz every day     Allergies   Patient has no known allergies.   Review of Systems Review of Systems 10 Systems reviewed and are negative for acute change except as noted in the HPI.   Physical Exam Updated Vital Signs BP 113/66 (BP Location: Left Arm)   Pulse 64   Temp 98.3 F (36.8 C) (Oral)   Resp 15   Ht 5\' 4"  (1.626 m)   Wt 68 kg (150 lb)   SpO2 92%   BMI 25.75 kg/m   Physical Exam  Constitutional: She is oriented to person, place, and time. She appears well-developed and well-nourished. No distress.  Patient sleeping comfortably as I come in the room. No respiratory distress. Upon awakening, mental status is clear. Pleasant and alert.  HENT:  Head: Normocephalic and atraumatic.  Mouth/Throat: Oropharynx is clear and moist.  Eyes: Conjunctivae and EOM are normal.  Neck: Neck supple.  Cardiovascular: Normal rate, regular rhythm, normal heart sounds and intact distal pulses.   No murmur heard. Pulmonary/Chest: Effort normal and breath sounds normal. No respiratory  distress. She exhibits no tenderness.  Abdominal: Soft. She exhibits no distension. There is no tenderness. There is no guarding.  Musculoskeletal: She exhibits edema.  2+ pitting edema bilateral lower extremities. Some diffuse erythema bilateral lower extremities. Patient endorses some discomfort with range of motion of the shoulder on the left. Forward flexion and abduction somewhat limited to approximately 150. No general edema or soft tissue tenderness of the arm. Patient is neurovascularly intact.  Neurological: She is alert and oriented to person, place, and time. No cranial nerve deficit. She exhibits normal muscle tone. Coordination normal.  Skin:  Skin is warm and dry.  Psychiatric: She has a normal mood and affect.  Nursing note and vitals reviewed.    ED Treatments / Results  Labs (all labs ordered are listed, but only abnormal results are displayed) Labs Reviewed  BASIC METABOLIC PANEL - Abnormal; Notable for the following:       Result Value   Potassium 3.4 (*)    Glucose, Bld 116 (*)    BUN 5 (*)    All other components within normal limits  URINALYSIS, ROUTINE W REFLEX MICROSCOPIC - Abnormal; Notable for the following:    Color, Urine COLORLESS (*)    Specific Gravity, Urine 1.002 (*)    All other components within normal limits  CBC WITH DIFFERENTIAL/PLATELET - Abnormal; Notable for the following:    WBC 11.7 (*)    Neutro Abs 9.2 (*)    Monocytes Absolute 1.1 (*)    All other components within normal limits  HEPATIC FUNCTION PANEL - Abnormal; Notable for the following:    Total Protein 8.5 (*)    ALT 8 (*)    All other components within normal limits  TROPONIN I - Abnormal; Notable for the following:    Troponin I 0.15 (*)    All other components within normal limits  ETHANOL  RAPID URINE DRUG SCREEN, HOSP PERFORMED  LIPASE, BLOOD    EKG  EKG Interpretation  Date/Time:  Saturday August 25 2016 20:05:59 EDT Ventricular Rate:  69 PR Interval:    QRS Duration: 98 QT Interval:  449 QTC Calculation: 481 R Axis:   34 Text Interpretation:  Sinus rhythm Borderline T abnormalities, inferior leads Confirmed by Charlesetta Shanks [phone]) on 08/25/2016 11:46:19 PM       Radiology Dg Chest 2 View  Result Date: 08/25/2016 CLINICAL DATA:  Chest pain.  Weakness. EXAM: CHEST  2 VIEW COMPARISON:  Radiograph 11/02/2015, additional priors FINDINGS: The cardiomediastinal contours are unchanged with borderline mild cardiomegaly and atherosclerosis of the aortic arch. Probable emphysema. Linear atelectasis or scarring at the left lung base. Pulmonary vasculature is normal. No consolidation, pleural effusion, or pneumothorax. No  acute osseous abnormalities are seen. IMPRESSION: 1. No acute abnormality. 2. Stable mild cardiomegaly and thoracic aortic atherosclerosis. Probable emphysema. Electronically Signed   By: Jeb Levering M.D.   On: 08/25/2016 21:14    Procedures Procedures (including critical care time)  Medications Ordered in ED Medications  aspirin chewable tablet 324 mg (not administered)  potassium chloride SA (K-DUR,KLOR-CON) CR tablet 40 mEq (not administered)     Initial Impression / Assessment and Plan / ED Course  I have reviewed the triage vital signs and the nursing notes.  Pertinent labs & imaging results that were available during my care of the patient were reviewed by me and considered in my medical decision making (see chart for details).    Consult:Cardiology Dr. Eula Fried. Will admit the patient, for left shoulder pain  and chest pain with elevated troponin. Will administer aspirin here and await heparin initiation for transfer.  Final Clinical Impressions(s) / ED Diagnoses   Final diagnoses:  Elevated troponin  Acute pain of left shoulder  Precordial pain   Patient is currently denying any chest pain. She has described left shoulder pain which seems to have some degree of muscle skeletal component over the past day and a half. Vital signs are stable. Patient does have elevated troponin. This is been reviewed cardiology. Patient will be transfer to [facility] for continued rule out and consultation with cardiology. New Prescriptions New Prescriptions   No medications on file     Charlesetta Shanks, MD 08/26/16 0005

## 2016-08-25 NOTE — ED Triage Notes (Signed)
Pt is c/o generalized weakness, left sided chest pain and left arm weakness. States onset yesterday but today it worsened and she experienced an episode of emesis that prompted her to call EMS for transport to [facility] for further evaluation. Denies any other neurological or cardiopulmonary symptoms.

## 2016-08-25 NOTE — ED Notes (Signed)
Pt has given verbal consent to keep her son Elenore Rota updated [phone].

## 2016-08-25 NOTE — ED Notes (Signed)
Bed: WA01 Expected date:  Expected time:  Means of arrival:  Comments: 86f left arm pain

## 2016-08-26 ENCOUNTER — Encounter (HOSPITAL_COMMUNITY): Payer: Self-pay | Admitting: Cardiology

## 2016-08-26 DIAGNOSIS — R079 Chest pain, unspecified: Principal | ICD-10-CM

## 2016-08-26 LAB — COMPREHENSIVE METABOLIC PANEL
ALBUMIN: 3.6 g/dL (ref 3.5–5.0)
ALT: 7 U/L — ABNORMAL LOW (ref 14–54)
ANION GAP: 9 (ref 5–15)
AST: 15 U/L (ref 15–41)
Alkaline Phosphatase: 93 U/L (ref 38–126)
BILIRUBIN TOTAL: 1 mg/dL (ref 0.3–1.2)
BUN: 5 mg/dL — AB (ref 6–20)
CALCIUM: 8.8 mg/dL — AB (ref 8.9–10.3)
CHLORIDE: 104 mmol/L (ref 101–111)
CO2: 25 mmol/L (ref 22–32)
CREATININE: 0.51 mg/dL (ref 0.44–1.00)
GFR calc Af Amer: >60 mL/min (ref >60)
GFR calc non Af Amer: >60 mL/min (ref >60)
GLUCOSE: 107 mg/dL — AB (ref 65–99)
POTASSIUM: 3.7 mmol/L (ref 3.5–5.1)
SODIUM: 138 mmol/L (ref 135–145)
Total Protein: 8.2 g/dL — ABNORMAL HIGH (ref 6.5–8.1)

## 2016-08-26 LAB — CBC
HCT: 40 % (ref 36.0–46.0)
Hemoglobin: 13.8 g/dL (ref 12.0–15.0)
MCH: 32.2 pg (ref 26.0–34.0)
MCHC: 34.5 g/dL (ref 30.0–36.0)
MCV: 93.2 fL (ref 78.0–100.0)
PLATELETS: 244 10*3/uL (ref 150–400)
RBC: 4.29 MIL/uL (ref 3.87–5.11)
RDW: 13.8 % (ref 11.5–15.5)
WBC: 8.5 10*3/uL (ref 4.0–10.5)

## 2016-08-26 LAB — BRAIN NATRIURETIC PEPTIDE: B NATRIURETIC PEPTIDE 5: 84.6 pg/mL (ref 0.0–100.0)

## 2016-08-26 LAB — LIPID PANEL
Cholesterol: 152 mg/dL (ref 0–200)
HDL: 64 mg/dL (ref >40)
LDL CALC: 72 mg/dL (ref 0–99)
TRIGLYCERIDES: 80 mg/dL (ref <150)
Total CHOL/HDL Ratio: 2.4 RATIO
VLDL: 16 mg/dL (ref 0–40)

## 2016-08-26 LAB — PROTIME-INR
INR: 1.08
PROTHROMBIN TIME: 14 s (ref 11.4–15.2)

## 2016-08-26 LAB — TROPONIN I
TROPONIN I: 0.15 ng/mL — AB (ref <0.03)
TROPONIN I: 0.1 ng/mL — AB (ref <0.03)
TROPONIN I: 0.1 ng/mL — AB (ref <0.03)

## 2016-08-26 LAB — TSH: TSH: 4.569 u[IU]/mL — AB (ref 0.350–4.500)

## 2016-08-26 LAB — HEPARIN LEVEL (UNFRACTIONATED)

## 2016-08-26 MED ORDER — SODIUM CHLORIDE 0.9% FLUSH
3.0000 mL | Freq: Two times a day (BID) | INTRAVENOUS | Status: DC
  Administered 2016-08-27 – 2016-08-30 (×5): 3 mL via INTRAVENOUS

## 2016-08-26 MED ORDER — FUROSEMIDE 20 MG PO TABS
20.0000 mg | ORAL_TABLET | Freq: Every day | ORAL | Status: DC
  Administered 2016-08-26 – 2016-08-30 (×5): 20 mg via ORAL
  Filled 2016-08-26 (×5): qty 1

## 2016-08-26 MED ORDER — ATORVASTATIN CALCIUM 80 MG PO TABS
80.0000 mg | ORAL_TABLET | Freq: Every day | ORAL | Status: DC
  Administered 2016-08-26 – 2016-08-30 (×5): 80 mg via ORAL
  Filled 2016-08-26 (×5): qty 1

## 2016-08-26 MED ORDER — HEPARIN (PORCINE) IN NACL 100-0.45 UNIT/ML-% IJ SOLN
800.0000 [IU]/h | INTRAMUSCULAR | Status: DC
  Administered 2016-08-26: 800 [IU]/h via INTRAVENOUS
  Filled 2016-08-26: qty 250

## 2016-08-26 MED ORDER — HEPARIN BOLUS VIA INFUSION
4000.0000 [IU] | Freq: Once | INTRAVENOUS | Status: AC
  Administered 2016-08-26: 4000 [IU] via INTRAVENOUS
  Filled 2016-08-26: qty 4000

## 2016-08-26 MED ORDER — HEPARIN (PORCINE) IN NACL 100-0.45 UNIT/ML-% IJ SOLN
1250.0000 [IU]/h | INTRAMUSCULAR | Status: DC
  Administered 2016-08-27: 1000 [IU]/h via INTRAVENOUS
  Filled 2016-08-26: qty 250

## 2016-08-26 MED ORDER — LEVOTHYROXINE SODIUM 50 MCG PO TABS
50.0000 ug | ORAL_TABLET | Freq: Every day | ORAL | Status: DC
  Administered 2016-08-27 – 2016-08-30 (×4): 50 ug via ORAL
  Filled 2016-08-26 (×4): qty 1

## 2016-08-26 MED ORDER — HEPARIN BOLUS VIA INFUSION
2000.0000 [IU] | Freq: Once | INTRAVENOUS | Status: AC
  Administered 2016-08-26: 2000 [IU] via INTRAVENOUS
  Filled 2016-08-26: qty 2000

## 2016-08-26 MED ORDER — VITAMIN D 1000 UNITS PO TABS
5000.0000 [IU] | ORAL_TABLET | Freq: Every day | ORAL | Status: DC
  Administered 2016-08-26 – 2016-08-30 (×5): 5000 [IU] via ORAL
  Filled 2016-08-26 (×8): qty 5

## 2016-08-26 MED ORDER — ASPIRIN EC 81 MG PO TBEC
81.0000 mg | DELAYED_RELEASE_TABLET | Freq: Every day | ORAL | Status: DC
  Administered 2016-08-26 – 2016-08-30 (×5): 81 mg via ORAL
  Filled 2016-08-26 (×5): qty 1

## 2016-08-26 MED ORDER — HYDROCHLOROTHIAZIDE 25 MG PO TABS
12.5000 mg | ORAL_TABLET | Freq: Every day | ORAL | Status: DC
  Administered 2016-08-26 – 2016-08-28 (×3): 12.5 mg via ORAL
  Filled 2016-08-26 (×3): qty 1

## 2016-08-26 NOTE — Progress Notes (Signed)
ANTICOAGULATION CONSULT NOTE - Initial Consult  Pharmacy Consult for heparin Indication: chest pain/ACS  No Known Allergies  Patient Measurements: Height: 5\' 4"  (162.6 cm) Weight: 150 lb (68 kg) IBW/kg (Calculated) : 54.7 Heparin Dosing Weight: 68kg  Vital Signs: BP: 150/87 (07/08 0630) Pulse Rate: 74 (07/08 0630)  Labs:  Recent Labs  08/25/16 1957 08/25/16 2042  HGB 14.0  --   HCT 40.1  --   PLT 253  --   CREATININE 0.57  --   TROPONINI  --  0.15*    Estimated Creatinine Clearance: 58.4 mL/min (by C-G formula based on SCr of 0.57 mg/dL).   Medical History: Past Medical History:  Diagnosis Date  . Hypertension    Assessment: 28 YOF presents with L shoulder pain.  No known history of CAD. Troponin elevated. Pharmacy asked to start heparin for NSTEMI.   Today, 08/26/2016  Renal: SCr WNL  CBC: Hgb and pltc WNL  No anticoagulant prior to admission  Goal of Therapy:  Heparin level 0.3-0.7 units/ml Monitor platelets by anticoagulation protocol: Yes   Plan:   Heparin 4000 unit bolus then 800 units/hr  Check 8h heparin level  Daily heparin level and CBC  Monitor for bleeding  Doreene Eland, PharmD, BCPS.   Pager: [phone] 08/26/2016 8:29 AM

## 2016-08-26 NOTE — H&P (Signed)
CARDIOLOGY ADMISSION NOTE  Patient ID: Ann Goodwin MRN: [id number] DOB/AGE: [birth year] 76 y.o.   Admit date: 08/25/2016 Primary Physician   Benito Mccreedy, MD Primary Cardiologist   Dr. Acie Fredrickson Chief Complaint    Chest pain  HPI:  This is a 76 y.o. female without known CAD but h/o alcoholism, chronic tobacco use, chronic LE venous stasis presented with c/o left shoulder pain and chest pain to [facility] ED.  She was seen in 2015 by Dr. Acie Fredrickson and had a stress test which was low risk.    She presented with pain in the shoulder.  She is vague about the onset. It might have been going on for a couple of days but was worse yesterday.  It was sharp and shooting.  It seemed to be positional with increased pain on moving the arm.   She very inactive.  She says she is afraid to fall and has gait problems.  She sleeps in a chair (because of bed bugs) but does not have PND or orthopnea.  She does report some DOE.  She does not report chest pressure.  She has no palpitations.  She did get nauseated yesterday.  She does not describe acute diaphoresis.  The called EMS  In the ED her trop was 0.15.   EKG was not acute.  She reports getting NTG in the ED but I don't see this.  She thought this might have helped with pain.  She did get ASA and heparin.     Past Medical History:  Diagnosis Date  . HTN (hypertension)   . Hypertension     Past Surgical History:  Procedure Laterality Date  . ABDOMINAL HYSTERECTOMY    . BUNIONECTOMY      No Known Allergies  Prior to Admission medications   Medication Sig Start Date End Date Taking? Authorizing Provider  atorvastatin (LIPITOR) 10 MG tablet Take 10 mg by mouth at bedtime.   Yes [provider]  Cholecalciferol (VITAMIN D3) 5000 units CAPS Take 5,000 Units by mouth daily.   Yes [provider]  furosemide (LASIX) 20 MG tablet Take 20 mg by mouth daily.   Yes [provider]  hydrochlorothiazide (HYDRODIURIL) 12.5 MG  tablet Take 12.5 mg by mouth daily.   Yes [provider]  levothyroxine (SYNTHROID, LEVOTHROID) 50 MCG tablet Take 50 mcg by mouth daily before breakfast.   Yes [provider]    Social History   Social History  . Marital status: Single    Spouse name: N/A  . Number of children: N/A  . Years of education: N/A   Occupational History  . Not on file.   Social History Main Topics  . Smoking status: Current Every Day Smoker    Packs/day: 0.50    Types: Cigarettes  . Smokeless tobacco: Never Used  . Alcohol use 1.2 oz/week    2 Cans of beer per week     Comment: 2 or 3 40 oz every day  . Drug use: No  . Sexual activity: Not on file   Other Topics Concern  . Not on file   Social History Narrative  . No narrative on file    Family History  Problem Relation Age of Onset  . Deep vein thrombosis Neg Hx   . Pulmonary embolism Neg Hx    She does not report a family history of CAD.  However, she is vague about why her son, father and mother died.  She does not  know much about the history.  ROS:  Positive for chronic neck pain and decreased gait.  Other  Physical Exam: Blood pressure (!) 152/79, pulse 65, temperature 98.8 F (37.1 C), temperature source Oral, resp. rate 18, height 5\' 6"  (1.676 m), weight 157 lb 14.4 oz (71.6 kg), SpO2 100 %.  GENERAL:  Well appearing HEENT:  Pupils equal round and reactive, fundi not visualized, oral mucosa unremarkable NECK:  No jugular venous distention, waveform within normal limits, carotid upstroke brisk and symmetric, no bruits, no thyromegaly LYMPHATICS:  No cervical, inguinal adenopathy LUNGS:  Clear to auscultation bilaterally BACK:  No CVA tenderness CHEST:  Unremarkable HEART:  PMI not displaced or sustained,S1 and S2 within normal limits, no S3, no S4, no clicks, no rubs, no murmurs ABD:  Flat, positive bowel sounds normal in frequency in pitch, no bruits, no rebound, no guarding, no midline pulsatile mass, no  hepatomegaly, no splenomegaly EXT:  2 plus pulses throughout, no edema, no cyanosis no clubbing SKIN:  No rashes no nodules NEURO:  Cranial nerves II through XII grossly intact, motor grossly intact throughout PSYCH:  Cognitively intact, oriented to person place and time  Labs: Lab Results  Component Value Date   BUN 5 (L) 08/26/2016   Lab Results  Component Value Date   CREATININE 0.51 08/26/2016   Lab Results  Component Value Date   NA 138 08/26/2016   K 3.7 08/26/2016   CL 104 08/26/2016   CO2 25 08/26/2016   Lab Results  Component Value Date   TROPONINI 0.15 (HH) 08/26/2016   Lab Results  Component Value Date   WBC 8.5 08/26/2016   HGB 13.8 08/26/2016   HCT 40.0 08/26/2016   MCV 93.2 08/26/2016   PLT 244 08/26/2016   Lab Results  Component Value Date   CHOL 152 08/26/2016   HDL 64 08/26/2016   LDLCALC 72 08/26/2016   TRIG 80 08/26/2016   CHOLHDL 2.4 08/26/2016   Lab Results  Component Value Date   ALT 7 (L) 08/26/2016   AST 15 08/26/2016   ALKPHOS 93 08/26/2016   BILITOT 1.0 08/26/2016      Radiology:  CXR:    EKG:    NSR, rate 69, QT prolonged, non specific ST T wave changes.  08/25/16  ASSESSMENT AND PLAN:     CHEST PAIN:  This is very atypical.  EKG non specific.  The enzymes are elevated.  However, despite this my suspicion for an ACS is low.  I would start with an echo.  If this is normal the [facility] before discharge.    TOBACCO ABUSE:  Continue to educate.    CHRONIC ETOH:  She reports that she drinks 3 - 4 beers nightly.  She needs to continue to be educated.    DYSLIPIDEMIA:  LDL and HDL ratio excellent.  No change in therapy.    SignedMinus Breeding 08/26/2016, 2:07 PM

## 2016-08-26 NOTE — ED Notes (Signed)
[facility] CALEB- MADE AWARE NO AM MEDICATION GIVEN

## 2016-08-26 NOTE — ED Notes (Signed)
DELAY IN TRANSFER. [facility] CALLED FOR ACUTE TRANSFER. PT MADE AWARE

## 2016-08-26 NOTE — ED Notes (Signed)
Bed: WA10 Expected date:  Expected time:  Means of arrival:  Comments: 

## 2016-08-26 NOTE — Progress Notes (Signed)
Patient noted to have venous stasis changes to lower legs, no open draining areas but areas that appear to have been former wounds especially left lower leg and back of right leg.  Patient states they used to be open but have healed up now.

## 2016-08-26 NOTE — Progress Notes (Signed)
ANTICOAGULATION CONSULT NOTE - Follow Up Consult  Pharmacy Consult for Heparin Indication: chest pain/ACS  No Known Allergies  Patient Measurements: Height: 5\' 6"  (167.6 cm) Weight: 157 lb 14.4 oz (71.6 kg) IBW/kg (Calculated) : 59.3  Vital Signs: Temp: 98.8 F (37.1 C) (07/08 1100) Temp Source: Oral (07/08 1100) BP: 133/74 (07/08 1720) Pulse Rate: 72 (07/08 1720)  Labs:  Recent Labs  08/25/16 1957 08/25/16 2042 08/26/16 0810 08/26/16 0811 08/26/16 1341 08/26/16 1904  HGB 14.0  --   --  13.8  --   --   HCT 40.1  --   --  40.0  --   --   PLT 253  --   --  244  --   --   LABPROT  --   --   --  14.0  --   --   INR  --   --   --  1.08  --   --   HEPARINUNFRC  --   --   --   --   --  <0.10*  CREATININE 0.57  --   --  0.51  --   --   TROPONINI  --  0.15* 0.15*  --  0.10*  --     Estimated Creatinine Clearance: 62.5 mL/min (by C-G formula based on SCr of 0.51 mg/dL).   Medications:  Heparin @ 800 units/hr  Assessment: Ann Goodwin started on heparin for chest pain. Initial heparin level is < 0.10. No issues with infusion per RN. CBC stable. No bleeding.  Goal of Therapy:  Heparin level 0.3-0.7 units/ml Monitor platelets by anticoagulation protocol: Yes   Plan:  1) Rebolus heparin 2000 units x 1 2) Increase heparin drip to 1000 units/hr 3) Follow up daily heparin level (will be ~ 8 hour level)  [name] 08/26/2016,7:52 PM

## 2016-08-27 ENCOUNTER — Inpatient Hospital Stay (HOSPITAL_COMMUNITY): Payer: Medicare Other

## 2016-08-27 DIAGNOSIS — R0789 Other chest pain: Secondary | ICD-10-CM

## 2016-08-27 LAB — URINALYSIS, ROUTINE W REFLEX MICROSCOPIC
Bilirubin Urine: NEGATIVE
GLUCOSE, UA: NEGATIVE mg/dL
KETONES UR: NEGATIVE mg/dL
LEUKOCYTES UA: NEGATIVE
Nitrite: NEGATIVE
PH: 7 (ref 5.0–8.0)
Protein, ur: NEGATIVE mg/dL
SPECIFIC GRAVITY, URINE: 1.003 — AB (ref 1.005–1.030)

## 2016-08-27 LAB — CBC
HEMATOCRIT: 40.5 % (ref 36.0–46.0)
HEMOGLOBIN: 13.5 g/dL (ref 12.0–15.0)
MCH: 31.4 pg (ref 26.0–34.0)
MCHC: 33.3 g/dL (ref 30.0–36.0)
MCV: 94.2 fL (ref 78.0–100.0)
Platelets: 253 10*3/uL (ref 150–400)
RBC: 4.3 MIL/uL (ref 3.87–5.11)
RDW: 13.6 % (ref 11.5–15.5)
WBC: 9.6 10*3/uL (ref 4.0–10.5)

## 2016-08-27 LAB — HEPARIN LEVEL (UNFRACTIONATED)
HEPARIN UNFRACTIONATED: 0.12 [IU]/mL — AB (ref 0.30–0.70)
HEPARIN UNFRACTIONATED: 0.35 [IU]/mL (ref 0.30–0.70)
Heparin Unfractionated: 0.3 IU/mL (ref 0.30–0.70)

## 2016-08-27 LAB — ECHOCARDIOGRAM COMPLETE
HEIGHTINCHES: 66 in
WEIGHTICAEL: 2433.6 [oz_av]

## 2016-08-27 LAB — TROPONIN I: TROPONIN I: 0.19 ng/mL — AB (ref <0.03)

## 2016-08-27 LAB — GLUCOSE, CAPILLARY: GLUCOSE-CAPILLARY: 124 mg/dL — AB (ref 65–99)

## 2016-08-27 MED ORDER — ACETAMINOPHEN 325 MG PO TABS: 650.0000 mg | ORAL_TABLET | ORAL | Status: DC | PRN

## 2016-08-27 MED ORDER — ONDANSETRON HCL 4 MG/2ML IJ SOLN: 4.0000 mg | Freq: Four times a day (QID) | INTRAMUSCULAR | Status: DC | PRN

## 2016-08-27 MED ORDER — ALPRAZOLAM 0.25 MG PO TABS: 0.2500 mg | ORAL_TABLET | Freq: Two times a day (BID) | ORAL | Status: DC | PRN

## 2016-08-27 MED ORDER — HEPARIN (PORCINE) IN NACL 100-0.45 UNIT/ML-% IJ SOLN
1450.0000 [IU]/h | INTRAMUSCULAR | Status: DC
  Administered 2016-08-27 – 2016-08-28 (×2): 1300 [IU]/h via INTRAVENOUS
  Filled 2016-08-27: qty 250

## 2016-08-27 MED ORDER — ZOLPIDEM TARTRATE 5 MG PO TABS: 5.0000 mg | ORAL_TABLET | Freq: Every evening | ORAL | Status: DC | PRN

## 2016-08-27 MED ORDER — TRAMADOL HCL 50 MG PO TABS
50.0000 mg | ORAL_TABLET | Freq: Four times a day (QID) | ORAL | Status: DC | PRN
  Administered 2016-08-27 – 2016-08-28 (×2): 50 mg via ORAL
  Filled 2016-08-27 (×2): qty 1

## 2016-08-27 MED ORDER — HEPARIN BOLUS VIA INFUSION
2000.0000 [IU] | Freq: Once | INTRAVENOUS | Status: AC
  Administered 2016-08-27: 2000 [IU] via INTRAVENOUS
  Filled 2016-08-27: qty 2000

## 2016-08-27 NOTE — Progress Notes (Signed)
  Echocardiogram 2D Echocardiogram has been performed.  Jennette Dubin 08/27/2016, 1:09 PM

## 2016-08-27 NOTE — Progress Notes (Addendum)
ANTICOAGULATION CONSULT NOTE - Follow Up Consult  Pharmacy Consult for Heparin Indication: chest pain/ACS  No Known Allergies  Patient Measurements: Height: 5\' 6"  (167.6 cm) Weight: 152 lb 1.6 oz (69 kg) (Scale A) IBW/kg (Calculated) : 59.3 Heparin Dosing Weight: 69 kg  Vital Signs: Temp: 99.1 F (37.3 C) (07/09 0538) Temp Source: Oral (07/09 0538) BP: 139/76 (07/09 0538) Pulse Rate: 84 (07/09 0538)  Labs:  Recent Labs  08/25/16 1957  08/26/16 0811 08/26/16 1341 08/26/16 1904 08/27/16 0458  HGB 14.0  --  13.8  --   --  13.5  HCT 40.1  --  40.0  --   --  40.5  PLT 253  --  244  --   --  253  LABPROT  --   --  14.0  --   --   --   INR  --   --  1.08  --   --   --   HEPARINUNFRC  --   --   --   --  <0.10* 0.12*  CREATININE 0.57  --  0.51  --   --   --   TROPONINI  --   < >  --  0.10* 0.10* 0.19*  < > = values in this interval not displayed.  Estimated Creatinine Clearance: 57.8 mL/min (by C-G formula based on SCr of 0.51 mg/dL).  Assessment:  76 yr old female on heparin for chest pain.  Heparin level is subtherapeutic (0.12) on 1000 units/hr. No known infusion problems. CBC stable.   Goal of Therapy:  Heparin level 0.3-0.7 units/ml Monitor platelets by anticoagulation protocol: Yes   Plan:   Heparin 2000 units IV x 1  Increase Heparin drip to 1250 units/hr.  Heparin level ~6 hrs after change.  Daily heparin level and CBC while on heparin.  Arty Baumgartner, [place] Pager: [phone]  08/27/2016,8:56 AM   Addendum:   Heparin level is low therapeutic (0.35) this afternoon on 1250 units/hr.   First level in target range.   Will repeat level later tonight to be sure it stays at goal.  Consuello Masse, RPh 08/27/2016 3:48 PM

## 2016-08-27 NOTE — Progress Notes (Signed)
ANTICOAGULATION CONSULT NOTE - Follow Up Consult  Pharmacy Consult for Heparin Indication: chest pain/ACS  No Known Allergies  Patient Measurements: Height: 5\' 6"  (167.6 cm) Weight: 152 lb 1.6 oz (69 kg) (Scale A) IBW/kg (Calculated) : 59.3 Heparin Dosing Weight: 69 kg  Vital Signs:    Labs:  Recent Labs  08/25/16 1957  08/26/16 0811 08/26/16 1341  08/26/16 1904 08/27/16 0458 08/27/16 1431 08/27/16 2102  HGB 14.0  --  13.8  --   --   --  13.5  --   --   HCT 40.1  --  40.0  --   --   --  40.5  --   --   PLT 253  --  244  --   --   --  253  --   --   LABPROT  --   --  14.0  --   --   --   --   --   --   INR  --   --  1.08  --   --   --   --   --   --   HEPARINUNFRC  --   --   --   --   < > <0.10* 0.12* 0.35 0.30  CREATININE 0.57  --  0.51  --   --   --   --   --   --   TROPONINI  --   < >  --  0.10*  --  0.10* 0.19*  --   --   < > = values in this interval not displayed.  Estimated Creatinine Clearance: 57.8 mL/min (by C-G formula based on SCr of 0.51 mg/dL).  Assessment:  76 yr old female on heparin for chest pain.  Heparin level is 0.30 on heparin 1250 units/hr.  HL remains therpeutic but decreased to low end of goal.  I will increase heparin rate by ~ 1 unit/kg/hr to keep HL within 0.3-0.7 Today's AM CBC is within normal/stable. No bleeding reported.      Goal of Therapy:  Heparin level 0.3-0.7 units/ml Monitor platelets by anticoagulation protocol: Yes   Plan:  Increase heparin to 1300 units/hr Daily heparin level and CBC while on heparin.  Nicole Cella, RPh Clinical Pharmacist 08/27/2016,10:02 PM

## 2016-08-27 NOTE — Progress Notes (Signed)
Progress Note  Patient Name: Ann Goodwin Date of Encounter: 08/27/2016  Primary Cardiologist: Dr Acie Fredrickson  Patient Profile     76 y.o. female without known CAD but h/o alcoholism, chronic tobacco use, chronic LE venous stasis presented 07/08 with c/o left shoulder pain and chest pain to [facility] ED. Tx to [facility] for admission.  Subjective   No chest pain, c/o abdominal pain and flank pain. Diffuse, denies N&V or diarrhea. No dysuria. Acute on chronic weakness. Lives w/ sons who help in her care.   Inpatient Medications    Scheduled Meds: . aspirin EC  81 mg Oral Daily  . atorvastatin  80 mg Oral q1800  . cholecalciferol  5,000 Units Oral Daily  . furosemide  20 mg Oral Daily  . hydrochlorothiazide  12.5 mg Oral Daily  . levothyroxine  50 mcg Oral QAC breakfast  . sodium chloride flush  3 mL Intravenous Q12H   Continuous Infusions: . heparin 1,250 Units/hr (08/27/16 0908)    Vital Signs    Vitals:   08/26/16 1100 08/26/16 1720 08/26/16 2119 08/27/16 0538  BP: (!) 152/79 133/74 (!) 146/78 139/76  Pulse: 65 72 85 84  Resp: 18  18 18   Temp: 98.8 F (37.1 C)  99.8 F (37.7 C) 99.1 F (37.3 C)  TempSrc: Oral  Oral Oral  SpO2: 100% 99% 98% 98%  Weight: 157 lb 14.4 oz (71.6 kg)   152 lb 1.6 oz (69 kg)  Height: 5\' 6"  (1.676 m)       Intake/Output Summary (Last 24 hours) at 08/27/16 1026 Last data filed at 08/27/16 1000  Gross per 24 hour  Intake           514.69 ml  Output             1250 ml  Net          -735.31 ml   Filed Weights   08/25/16 2006 08/26/16 1100 08/27/16 0538  Weight: 150 lb (68 kg) 157 lb 14.4 oz (71.6 kg) 152 lb 1.6 oz (69 kg)    Telemetry    SR, occ PVCs - Personally Reviewed  ECG    None today  Physical Exam   General: Well developed, well nourished, female appearing in no acute distress. Head: Normocephalic, atraumatic.  Neck: Supple without bruits, JVD not elevated. Lungs:  Resp regular and unlabored, CTA. Heart: RRR, S1,  S2, no S3, S4, or murmur; no rub. Abdomen: Soft, diffusely tender, mild-moderately distended with normoactive bowel sounds. No hepatomegaly. No rebound/guarding. No obvious abdominal masses. Extremities: No clubbing, cyanosis, no edema. Distal pedal pulses are 2+ bilaterally. Neuro: Alert and oriented X 3. Moves all extremities spontaneously.  Labs    Hematology Recent Labs Lab 08/25/16 1957 08/26/16 0811 08/27/16 0458  WBC 11.7* 8.5 9.6  RBC 4.32 4.29 4.30  HGB 14.0 13.8 13.5  HCT 40.1 40.0 40.5  MCV 92.8 93.2 94.2  MCH 32.4 32.2 31.4  MCHC 34.9 34.5 33.3  RDW 13.5 13.8 13.6  PLT 253 244 253    Chemistry Recent Labs Lab 08/25/16 1957 08/25/16 2042 08/26/16 0811  NA 138  --  138  K 3.4*  --  3.7  CL 104  --  104  CO2 26  --  25  GLUCOSE 116*  --  107*  BUN 5*  --  5*  CREATININE 0.57  --  0.51  CALCIUM 9.1  --  8.8*  PROT  --  8.5* 8.2*  ALBUMIN  --  3.8 3.6  AST  --  17 15  ALT  --  8* 7*  ALKPHOS  --  94 93  BILITOT  --  0.5 1.0  GFRNONAA >60  --  >60  GFRAA >60  --  >60  ANIONGAP 8  --  9     Cardiac Enzymes Recent Labs Lab 08/26/16 0810 08/26/16 1341 08/26/16 1904 08/27/16 0458  TROPONINI 0.15* 0.10* 0.10* 0.19*   BNP Recent Labs Lab 08/26/16 0810  BNP 84.6     Radiology    Dg Chest 2 View  Result Date: 08/25/2016 CLINICAL DATA:  Chest pain.  Weakness. EXAM: CHEST  2 VIEW COMPARISON:  Radiograph 11/02/2015, additional priors FINDINGS: The cardiomediastinal contours are unchanged with borderline mild cardiomegaly and atherosclerosis of the aortic arch. Probable emphysema. Linear atelectasis or scarring at the left lung base. Pulmonary vasculature is normal. No consolidation, pleural effusion, or pneumothorax. No acute osseous abnormalities are seen. IMPRESSION: 1. No acute abnormality. 2. Stable mild cardiomegaly and thoracic aortic atherosclerosis. Probable emphysema. Electronically Signed   By: Jeb Levering M.D.   On: 08/25/2016 21:14      Cardiac Studies   ECHO: 08/27/2016 ordered  Patient Profile     76 y.o. female without known CAD but h/o alcoholism, chronic tobacco use, chronic LE venous stasis presented 07/08 with c/o left shoulder pain and chest pain to [facility] ED. Tx to [facility] for admission.  Assessment & Plan    CHEST PAIN:  This is very atypical.  EKG non specific.  The enzymes are mildly elevated. Suspicion for an ACS is low. Echo today, if EF normal w/ no WMA, then Lexiscan Myoview before discharge. Will try to get today. Echo tech aware to call.  TOBACCO ABUSE:  Continue to educate. Says she is doing OK w/out tobacco   CHRONIC ETOH:  She reports that she drinks 3 - 4 beers nightly.  She needs to continue to be educated.  Says she has never had DTs  DYSLIPIDEMIA:  LDL and HDL ratio excellent.  No change in therapy.    Abd pain and flank pain: pt states abd tender all over and c/o R flank pain also. Ck UA, discuss CT vs abd echo w/ MD. LFTs are ok except ALT and protein are low  Signed, Rosaria Ferries , PA-C 10:26 AM 08/27/2016 Pager: [phone]  I have seen and examined the patient along with Barrett, Suanne Marker , PA-C.  I have reviewed the chart, notes and new data.  I agree with PA's note.  Key new complaints: no chest pain right now Key examination changes: no overt hypervolemia, no arrhythmia Key new findings / data: low risk ECG, "plateau" elevation in troponin is not c/w true acute coronary sd and is highly atypical.  PLAN: Awaiting echo before formulating further diagnostic plans.  Sanda Klein, MD, [place] [phone] 08/27/2016, 10:58 AM

## 2016-08-27 NOTE — Progress Notes (Signed)
Patient complaining of abdominal pain. Abdomen tender, verbalized soreness when touch. Cards PA paged made aware, awaiting to call back.

## 2016-08-28 ENCOUNTER — Inpatient Hospital Stay (HOSPITAL_COMMUNITY): Payer: Medicare Other

## 2016-08-28 ENCOUNTER — Encounter (HOSPITAL_COMMUNITY): Payer: Self-pay | Admitting: Physician Assistant

## 2016-08-28 DIAGNOSIS — N838 Other noninflammatory disorders of ovary, fallopian tube and broad ligament: Secondary | ICD-10-CM

## 2016-08-28 DIAGNOSIS — R079 Chest pain, unspecified: Secondary | ICD-10-CM

## 2016-08-28 DIAGNOSIS — R634 Abnormal weight loss: Secondary | ICD-10-CM

## 2016-08-28 DIAGNOSIS — Z720 Tobacco use: Secondary | ICD-10-CM

## 2016-08-28 DIAGNOSIS — R600 Localized edema: Secondary | ICD-10-CM

## 2016-08-28 DIAGNOSIS — K829 Disease of gallbladder, unspecified: Secondary | ICD-10-CM

## 2016-08-28 DIAGNOSIS — R109 Unspecified abdominal pain: Secondary | ICD-10-CM

## 2016-08-28 DIAGNOSIS — I77810 Thoracic aortic ectasia: Secondary | ICD-10-CM

## 2016-08-28 DIAGNOSIS — R0789 Other chest pain: Secondary | ICD-10-CM

## 2016-08-28 DIAGNOSIS — R7989 Other specified abnormal findings of blood chemistry: Secondary | ICD-10-CM

## 2016-08-28 DIAGNOSIS — M25512 Pain in left shoulder: Secondary | ICD-10-CM

## 2016-08-28 DIAGNOSIS — B888 Other specified infestations: Secondary | ICD-10-CM

## 2016-08-28 DIAGNOSIS — R778 Other specified abnormalities of plasma proteins: Secondary | ICD-10-CM

## 2016-08-28 HISTORY — DX: Thoracic aortic ectasia: I77.810

## 2016-08-28 LAB — NM MYOCAR MULTI W/SPECT W/WALL MOTION / EF
CHL CUP RESTING HR STRESS: 85 {beats}/min
Peak HR: 105 {beats}/min

## 2016-08-28 LAB — GLUCOSE, CAPILLARY
GLUCOSE-CAPILLARY: 105 mg/dL — AB (ref 65–99)
Glucose-Capillary: 93 mg/dL (ref 65–99)

## 2016-08-28 LAB — URINE DRUGS OF ABUSE SCREEN W ALC, ROUTINE (REF LAB)
Amphetamines, Urine: NEGATIVE ng/mL
BENZODIAZEPINE QUANT UR: NEGATIVE ng/mL
Barbiturate, Ur: NEGATIVE ng/mL
CANNABINOID QUANT UR: NEGATIVE ng/mL
Cocaine (Metab.): NEGATIVE ng/mL
Ethanol U, Quan: NEGATIVE %
Methadone Screen, Urine: NEGATIVE ng/mL
OPIATE QUANT UR: NEGATIVE ng/mL
PHENCYCLIDINE, UR: NEGATIVE ng/mL
Propoxyphene, Urine: NEGATIVE ng/mL

## 2016-08-28 LAB — CBC
HCT: 40.9 % (ref 36.0–46.0)
Hemoglobin: 13.5 g/dL (ref 12.0–15.0)
MCH: 31 pg (ref 26.0–34.0)
MCHC: 33 g/dL (ref 30.0–36.0)
MCV: 94 fL (ref 78.0–100.0)
PLATELETS: 253 10*3/uL (ref 150–400)
RBC: 4.35 MIL/uL (ref 3.87–5.11)
RDW: 13.6 % (ref 11.5–15.5)
WBC: 10 10*3/uL (ref 4.0–10.5)

## 2016-08-28 LAB — HEPARIN LEVEL (UNFRACTIONATED)
HEPARIN UNFRACTIONATED: 0.19 [IU]/mL — AB (ref 0.30–0.70)
Heparin Unfractionated: 0.23 IU/mL — ABNORMAL LOW (ref 0.30–0.70)

## 2016-08-28 MED ORDER — POTASSIUM CHLORIDE CRYS ER 20 MEQ PO TBCR
20.0000 meq | EXTENDED_RELEASE_TABLET | Freq: Every day | ORAL | Status: DC
  Administered 2016-08-28 – 2016-08-30 (×3): 20 meq via ORAL
  Filled 2016-08-28 (×3): qty 1

## 2016-08-28 MED ORDER — TECHNETIUM TC 99M TETROFOSMIN IV KIT
10.0000 | PACK | Freq: Once | INTRAVENOUS | Status: AC | PRN
  Administered 2016-08-28: 10 via INTRAVENOUS

## 2016-08-28 MED ORDER — REGADENOSON 0.4 MG/5ML IV SOLN
INTRAVENOUS | Status: AC
  Filled 2016-08-28: qty 5

## 2016-08-28 MED ORDER — VITAMIN B-1 100 MG PO TABS
100.0000 mg | ORAL_TABLET | Freq: Every day | ORAL | Status: DC
  Administered 2016-08-28 – 2016-08-30 (×3): 100 mg via ORAL
  Filled 2016-08-28 (×3): qty 1

## 2016-08-28 MED ORDER — REGADENOSON 0.4 MG/5ML IV SOLN
0.4000 mg | Freq: Once | INTRAVENOUS | Status: AC
  Administered 2016-08-28: 0.4 mg via INTRAVENOUS
  Filled 2016-08-28: qty 5

## 2016-08-28 MED ORDER — TECHNETIUM TC 99M TETROFOSMIN IV KIT
30.0000 | PACK | Freq: Once | INTRAVENOUS | Status: AC | PRN
  Administered 2016-08-28: 30 via INTRAVENOUS

## 2016-08-28 MED ORDER — POTASSIUM CHLORIDE CRYS ER 20 MEQ PO TBCR: 20.0000 meq | EXTENDED_RELEASE_TABLET | Freq: Every day | ORAL | 1 refills | Status: DC

## 2016-08-28 MED ORDER — ADULT MULTIVITAMIN W/MINERALS CH
1.0000 | ORAL_TABLET | Freq: Every day | ORAL | Status: DC
  Administered 2016-08-28 – 2016-08-30 (×3): 1 via ORAL
  Filled 2016-08-28 (×3): qty 1

## 2016-08-28 MED ORDER — IOPAMIDOL (ISOVUE-300) INJECTION 61%
INTRAVENOUS | Status: AC
  Administered 2016-08-28: 100 mL
  Filled 2016-08-28: qty 100

## 2016-08-28 NOTE — Evaluation (Signed)
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 PMH for HTN, alcoholism, chronic tobacco use, chronic LE venous stasis.  Clinical Impression  Pt admitted with above diagnosis. Pt currently with functional limitations due to the deficits listed below (see PT Problem List). Pt is mod A for bed mobility and transfers. Pt will benefit from skilled PT to increase their independence and safety with mobility to allow discharge to the venue listed below.       Follow Up Recommendations SNF    Equipment Recommendations  Other (comment) (to be determined at next venue)    Recommendations for Other Services OT consult     Precautions / Restrictions Precautions Precautions: Fall Restrictions Weight Bearing Restrictions: No      Mobility  Bed Mobility Overal bed mobility: Needs Assistance Bed Mobility: Supine to Sit     Supine to sit: Mod assist     General bed mobility comments: mod A for trunk to upright, LE management off EoB and pad scoot to EoB  Transfers Overall transfer level: Needs assistance Equipment used: None Transfers: Stand Pivot Transfers   Stand pivot transfers: Mod assist       General transfer comment: modA for powerup and pivoting over to recliner, pt fearful and required max verbal and tactile cues to release from bed rail to reach over to arm rest of recliner to sit, mod A to lower into recliner         Balance Overall balance assessment: Needs assistance Sitting-balance support: Feet supported Sitting balance-Leahy Scale: Poor     Standing balance support: Bilateral upper extremity supported Standing balance-Leahy Scale: Zero Standing balance comment: unable to come to static standing without modA                              Pertinent Vitals/Pain Pain Assessment: Faces Faces Pain Scale: Hurts even more Pain Location: bilateral feet with standing Pain Descriptors / Indicators: Guarding;Grimacing Pain Intervention(s): Monitored during session;Limited activity within patient's tolerance  VSS    Home Living Family/patient expects to be discharged to:: Private residence Living Arrangements: Children Available Help at Discharge: Available PRN/intermittently;Family Type of Home: House Home Access: Stairs to enter Entrance Stairs-Rails: None Entrance Stairs-Number of Steps: 3 Home Layout: One level Home Equipment: Environmental consultant - 2 wheels      Prior Function Level of Independence: Independent         Comments: pt states she was getting around without AD but difficulty, pt adult sons live with her and offer questionable amount of help     Hand Dominance        Extremity/Trunk Assessment   Upper Extremity Assessment Upper Extremity Assessment: Defer to OT evaluation    Lower Extremity Assessment Lower Extremity Assessment: RLE deficits/detail;LLE deficits/detail RLE Deficits / Details: strength grossly 2+/5, ROM limited by weakness LLE Deficits / Details: strength grossly 2+/5, ROM limited by weakness       Communication   Communication: No difficulties  Cognition Arousal/Alertness: Awake/alert Behavior During Therapy: WFL for tasks assessed/performed Overall Cognitive Status: No family/caregiver present to determine baseline cognitive functioning  Assessment/Plan    PT Assessment Patient needs continued PT services  PT Problem List Decreased strength;Decreased range of motion;Decreased activity tolerance;Decreased balance;Decreased mobility;Decreased safety awareness;Pain       PT Treatment Interventions DME instruction;Gait training;Stair training;Functional mobility training;Therapeutic  activities;Therapeutic exercise;Balance training;Patient/family education    PT Goals (Current goals can be found in the Care Plan section)  Acute Rehab PT Goals Patient Stated Goal: go some place to get stronder PT Goal Formulation: With patient Time For Goal Achievement: 09/04/16 Potential to Achieve Goals: Fair    Frequency Min 2X/week   Barriers to discharge Inaccessible home environment         AM-PAC PT "6 Clicks" Daily Activity  Outcome Measure Difficulty turning over in bed (including adjusting bedclothes, sheets and blankets)?: A Lot Difficulty moving from lying on back to sitting on the side of the bed? : Total Difficulty sitting down on and standing up from a chair with arms (e.g., wheelchair, bedside commode, etc,.)?: Total Help needed moving to and from a bed to chair (including a wheelchair)?: A Lot Help needed walking in hospital room?: Total Help needed climbing 3-5 steps with a railing? : Total 6 Click Score: 8    End of Session Equipment Utilized During Treatment: Gait belt Activity Tolerance: Patient limited by pain;Patient limited by fatigue Patient left: in chair;with call bell/phone within reach;with chair alarm set Nurse Communication: Mobility status PT Visit Diagnosis: Unsteadiness on feet (R26.81);Other abnormalities of gait and mobility (R26.89);Muscle weakness (generalized) (M62.81);Difficulty in walking, not elsewhere classified (R26.2);Pain Pain - part of body: Ankle and joints of foot    Time: 1497-0263 PT Time Calculation (min) (ACUTE ONLY): 20 min   Charges:   PT Evaluation $PT Eval Moderate Complexity: 1 Procedure     PT G Codes:        Ellamae Lybeck B. Migdalia Dk PT, DPT Acute Rehabilitation  [phone] Pager [phone]    [place] 08/28/2016, 6:03 PM

## 2016-08-28 NOTE — Progress Notes (Addendum)
There seemed to be a discharge coming from patients finger nails with a foul smell. Patient slept well last night, has been NPO.

## 2016-08-28 NOTE — Progress Notes (Signed)
Progress Note  Patient Name: Ann Goodwin Date of Encounter: 08/28/2016  Primary Cardiologist: Dr. Acie Fredrickson  Subjective   Feeling better today. Had 2 min of vague CP this AM, now resolved. No further abd pain.  Inpatient Medications    Scheduled Meds: . regadenoson      . aspirin EC  81 mg Oral Daily  . atorvastatin  80 mg Oral q1800  . cholecalciferol  5,000 Units Oral Daily  . furosemide  20 mg Oral Daily  . hydrochlorothiazide  12.5 mg Oral Daily  . levothyroxine  50 mcg Oral QAC breakfast  . regadenoson  0.4 mg Intravenous Once  . sodium chloride flush  3 mL Intravenous Q12H   Continuous Infusions: . heparin 1,450 Units/hr (08/28/16 0642)   PRN Meds: acetaminophen, ALPRAZolam, ondansetron (ZOFRAN) IV, traMADol, zolpidem   Vital Signs    Vitals:   08/27/16 2328 08/28/16 0518 08/28/16 0743 08/28/16 0918  BP: 133/75 135/73 136/67 129/82  Pulse: 79 73 70   Resp: 18 16 20    Temp: 99.4 F (37.4 C) 98.9 F (37.2 C) 99.3 F (37.4 C)   TempSrc: Oral Oral Oral   SpO2: 100% 98% 97%   Weight:  151 lb 6.4 oz (68.7 kg)    Height:        Intake/Output Summary (Last 24 hours) at 08/28/16 1010 Last data filed at 08/28/16 6222  Gross per 24 hour  Intake            96.82 ml  Output             1300 ml  Net         -1203.18 ml   Filed Weights   08/26/16 1100 08/27/16 0538 08/28/16 0518  Weight: 157 lb 14.4 oz (71.6 kg) 152 lb 1.6 oz (69 kg) 151 lb 6.4 oz (68.7 kg)    Telemetry    NSR - Personally Reviewed  Physical Exam   GEN: Chronically disshelved appearing AAF NAD long fingernails HEENT: Normocephalic, atraumatic, sclera non-icteric. Neck: No JVD or bruits. Cardiac: RRR no murmurs, rubs, or gallops.  Radials/DP/PT 1+ and equal bilaterally.  Respiratory: Clear to auscultation bilaterally. Breathing is unlabored. GI: Soft, nontender, non-distended, BS +x 4. MS: no deformity. Extremities: No clubbing or cyanosis. No edema. Distal pedal pulses are 2+ and  equal bilaterally. Neuro:  AAOx3. Follows commands. Psych:  Responds to questions appropriately with a normal affect.  Labs    Chemistry Recent Labs Lab 08/25/16 1957 08/25/16 2042 08/26/16 0811  NA 138  --  138  K 3.4*  --  3.7  CL 104  --  104  CO2 26  --  25  GLUCOSE 116*  --  107*  BUN 5*  --  5*  CREATININE 0.57  --  0.51  CALCIUM 9.1  --  8.8*  PROT  --  8.5* 8.2*  ALBUMIN  --  3.8 3.6  AST  --  17 15  ALT  --  8* 7*  ALKPHOS  --  94 93  BILITOT  --  0.5 1.0  GFRNONAA >60  --  >60  GFRAA >60  --  >60  ANIONGAP 8  --  9     Hematology Recent Labs Lab 08/26/16 0811 08/27/16 0458 08/28/16 0458  WBC 8.5 9.6 10.0  RBC 4.29 4.30 4.35  HGB 13.8 13.5 13.5  HCT 40.0 40.5 40.9  MCV 93.2 94.2 94.0  MCH 32.2 31.4 31.0  MCHC 34.5 33.3 33.0  RDW 13.8 13.6 13.6  PLT 244 253 253    Cardiac Enzymes Recent Labs Lab 08/26/16 0810 08/26/16 1341 08/26/16 1904 08/27/16 0458  TROPONINI 0.15* 0.10* 0.10* 0.19*   No results for input(s): TROPIPOC in the last 168 hours.   BNP Recent Labs Lab 08/26/16 0810  BNP 84.6     DDimer No results for input(s): DDIMER in the last 168 hours.   Radiology    No results found.  Cardiac Studies   2D echo 08/27/16 Study Conclusions  - Left ventricle: The cavity size was normal. Wall thickness was   increased in a pattern of mild LVH. Systolic function was normal.   The estimated ejection fraction was in the range of 50% to 55%.   Wall motion was normal; there were no regional wall motion   abnormalities. Doppler parameters are consistent with abnormal   left ventricular relaxation (grade 1 diastolic dysfunction). - Aortic root: The aortic root was mildly dilated.  Impressions:  - Normal LV systolic function; mild diastolic dysfunction; mild   LVH; mildly dilated aortic root.  Patient Profile     76 y.o. female without known CAD but h/o alcoholism, chronic tobacco use, chronic LE venous stasis presented 07/08  with c/o left shoulder pain and chest pain to [facility]. Tx to [facility] for admission.  Assessment & Plan    1. Atypical shoulder/CP with minimally elevated troponin - pending nuc result.  2. Abdominal pain, h/o alcoholism - resolved, will need fu PCP. Cessation of alcohol advised.  3. Chronic LE edema - BNP normal this admission, no sig edema on exam today.  4. Mildly dilated aortic root - will need f/u as OP periodically.  Signed, Charlie Pitter, PA-C  08/28/2016, 10:10 AM

## 2016-08-28 NOTE — Progress Notes (Signed)
Ann Goodwin  [phone]   [place]  DOB: 1940-11-01  MR#: [id number]  CSN#: [id number]    Requesting Physician: [facility]  Patient Care Team: Benito Mccreedy, MD as PCP - General (Internal Medicine)  Reason for consult:  gallbladder mass  History of present illness:   Ann Goodwin is a 76 year old female with past medical history smoking and alcohol abuse, presented with chest pain and right flank pain to emergency room on 08/26/2016. CT scan showed a gallbladder mass. I was called to develop patient for further workup.  She has been admitted to the hospital for acute pain of left shoulder, chest and flank pain. CT today showed5.2 x 3.3 x 4.2 cm gallbladder mass. He has occasional pain when she smokes. She denies any pain when she eats. She has been having intermittent right sided back pain for a couple months. She has also been having low appetite for the last month which has caused weight loss. Denies nausea or bloating. She does not think she's been getting weaker lately and can still complete household chores. She has some pain medication at home which she takes as needed.   She lives with her sons at home.  MEDICAL HISTORY:  Past Medical History:  Diagnosis Date  . Alcohol abuse   . Chronic edema   . Dilated aortic root ([place]) 08/28/2016  . Hyperlipidemia   . Hypertension   . Hypothyroid   . Infestation by bed bug   . Tobacco abuse   . Venous stasis     SURGICAL HISTORY: Past Surgical History:  Procedure Laterality Date  . ABDOMINAL HYSTERECTOMY    . BUNIONECTOMY      SOCIAL HISTORY: Social History   Social History  . Marital status: Single    Spouse name: N/A  . Number of children: N/A  . Years of education: N/A   Occupational History  . Not on file.   Social History Main Topics  . Smoking status: Current Every Day Smoker    Packs/day: 0.50    Types: Cigarettes  .  Smokeless tobacco: Never Used  . Alcohol use 1.2 oz/week    2 Cans of beer per week     Comment: 2 or 3 40 oz every day  . Drug use: No  . Sexual activity: Not on file   Other Topics Concern  . Not on file   Social History Narrative  . No narrative on file    FAMILY HISTORY: Family History  Problem Relation Age of Onset  . Stroke Mother   . Deep vein thrombosis Neg Hx   . Pulmonary embolism Neg Hx     ALLERGIES:  has No Known Allergies.  MEDICATIONS:  Current Facility-Administered Medications  Medication Dose Route Frequency Provider Last Rate Last Dose  . acetaminophen (TYLENOL) tablet 650 mg  650 mg Oral Q4H PRN Barrett, Evelene Croon, PA-C      . ALPRAZolam (XANAX) tablet 0.25 mg  0.25 mg Oral BID PRN Barrett, Evelene Croon, PA-C      . aspirin EC tablet 81 mg  81 mg Oral Daily Geralynn Ochs T, MD   81 mg at 08/28/16 1144  . atorvastatin (LIPITOR) tablet 80 mg  80 mg Oral q1800 Geralynn Ochs T, MD   80 mg at 08/27/16 1740  . cholecalciferol (VITAMIN D) tablet 5,000 Units  5,000 Units Oral Daily Geralynn Ochs T, MD   5,000 Units at 08/28/16 1143  .  furosemide (LASIX) tablet 20 mg  20 mg Oral Daily Geralynn Ochs T, MD   20 mg at 08/28/16 1144  . hydrochlorothiazide (HYDRODIURIL) tablet 12.5 mg  12.5 mg Oral Daily Geralynn Ochs T, MD   12.5 mg at 08/28/16 1144  . levothyroxine (SYNTHROID, LEVOTHROID) tablet 50 mcg  50 mcg Oral QAC breakfast Geralynn Ochs T, MD   50 mcg at 08/28/16 1143  . ondansetron (ZOFRAN) injection 4 mg  4 mg Intravenous Q6H PRN Barrett, Rhonda G, PA-C      . potassium chloride SA (K-DUR,KLOR-CON) CR tablet 20 mEq  20 mEq Oral Daily Dunn, Dayna N, PA-C      . regadenoson (LEXISCAN) 0.4 MG/5ML injection SOLN           . sodium chloride flush (NS) 0.9 % injection 3 mL  3 mL Intravenous Q12H Geralynn Ochs T, MD   3 mL at 08/27/16 2330  . traMADol (ULTRAM) tablet 50-100 mg  50-100 mg Oral Q6H PRN Barrett, Rhonda G, PA-C   50 mg at 08/27/16 1419  . zolpidem  (AMBIEN) tablet 5 mg  5 mg Oral QHS PRN Barrett, Evelene Croon, PA-C        REVIEW OF SYSTEMS:   Constitutional: Denies fevers, chills or abnormal night sweats (+)loss of appetite (+)weight loss Eyes: Denies blurriness of vision, double vision or watery eyes Ears, nose, mouth, throat, and face: Denies mucositis or sore throat Respiratory: Denies cough, dyspnea or wheezes Cardiovascular: Denies palpitation, chest discomfort or lower extremity swelling Gastrointestinal:  Denies nausea, heartburn or change in bowel habits Skin: Denies abnormal skin rashes  Lymphatics: Denies new lymphadenopathy or easy bruising Neurological:Denies numbness, tingling or new weaknesses Behavioral/Psych: Mood is stable, no new changes  MSK: (+) intermittent right sided back pain  All other systems were reviewed with the patient and are negative.  PHYSICAL EXAMINATION: ECOG PERFORMANCE STATUS: 2 - Symptomatic, <50% confined to bed  Vitals:   08/28/16 1023 08/28/16 1126  BP: 113/78 136/67  Pulse:  77  Resp: 20 20  Temp:  99 F (37.2 C)   Filed Weights   08/26/16 1100 08/27/16 0538 08/28/16 0518  Weight: 157 lb 14.4 oz (71.6 kg) 152 lb 1.6 oz (69 kg) 151 lb 6.4 oz (68.7 kg)    GENERAL:alert, no distress and comfortable SKIN: skin color, texture, turgor are normal, no rashes or significant lesions EYES: normal, conjunctiva are pink and non-injected, sclera clear OROPHARYNX:no exudate, no erythema and lips, buccal mucosa, and tongue normal  NECK: supple, thyroid normal size, non-tender, without nodularity LYMPH:  no palpable lymphadenopathy in the cervical, axillary or inguinal LUNGS: clear to auscultation and percussion with normal breathing effort HEART: regular rate & rhythm and no murmurs and no lower extremity edema ABDOMEN:abdomen soft and normal bowel sounds (+)Mild tenders in RUQ, benign Musculoskeletal:no cyanosis of digits and no clubbing  PSYCH: alert & oriented x 3 with fluent speech NEURO: no  focal motor/sensory deficits  LABORATORY DATA:  I have reviewed the data as listed Lab Results  Component Value Date   WBC 10.0 08/28/2016   HGB 13.5 08/28/2016   HCT 40.9 08/28/2016   MCV 94.0 08/28/2016   PLT 253 08/28/2016    Recent Labs  11/02/15 1550  11/05/15 0518 08/25/16 1957 08/25/16 2042 08/26/16 0811  NA 137  < > 138 138  --  138  K 3.4*  < > 3.4* 3.4*  --  3.7  CL 105  < > 101 104  --  104  CO2 23  < > 29 26  --  25  GLUCOSE 103*  < > 119* 116*  --  107*  BUN 9  < > 8 5*  --  5*  CREATININE 0.68  < > 0.62 0.57  --  0.51  CALCIUM 9.3  < > 9.3 9.1  --  8.8*  GFRNONAA >60  < > >60 >60  --  >60  GFRAA >60  < > >60 >60  --  >60  PROT 7.6  --   --   --  8.5* 8.2*  ALBUMIN 3.5  --  3.2*  --  3.8 3.6  AST 19  --   --   --  17 15  ALT 11*  --   --   --  8* 7*  ALKPHOS 83  --   --   --  94 93  BILITOT 0.6  --   --   --  0.5 1.0  BILIDIR  --   --   --   --  0.1  --   IBILI  --   --   --   --  0.4  --   < > = values in this interval not displayed.  RADIOGRAPHIC STUDIES: I have personally reviewed the radiological images as listed and agreed with the findings in the report. Ct Abdomen Pelvis W Wo Contrast  Result Date: 08/28/2016 CLINICAL DATA:  Right lower abdominal pain with nausea since last night. Prior hysterectomy. Inpatient. EXAM: CT ABDOMEN AND PELVIS WITHOUT AND WITH CONTRAST TECHNIQUE: Multidetector CT imaging of the abdomen and pelvis was performed following the standard protocol before and following the bolus administration of intravenous contrast. CONTRAST:  136mL ISOVUE-300 IOPAMIDOL (ISOVUE-300) INJECTION 61% COMPARISON:  None. FINDINGS: Lower chest: Mild scarring versus atelectasis in the dependent lung bases bilaterally. Coronary atherosclerosis. Hepatobiliary: There is an avidly enhancing infiltrative appearing 5.2 x 3.3 x 4.2 cm mass involving much of the gallbladder (series 8/ image 35). No radiopaque cholelithiasis. No definite pericholecystic fluid.  Normal liver size. No definite liver surface irregularity. There is poorly marginated heterogeneous attenuation of the subcapsular liver adjacent to the gallbladder. No discrete liver masses. No biliary ductal dilatation. Common bile duct diameter 6 mm, top-normal. No radiopaque choledocholithiasis. Pancreas: Normal, with no mass or duct dilation. Spleen: Normal size. No mass. Adrenals/Urinary Tract: Normal adrenals. No renal stones. No hydronephrosis. Symmetric normal contrast nephrograms. Normal size kidneys. No renal masses. Normal bladder. Stomach/Bowel: Small hiatal hernia. Otherwise collapsed and grossly normal stomach. Normal caliber small bowel with no small bowel wall thickening. Candidate normal diminutive appendix. No pericecal inflammatory changes. Scattered mild to moderate colonic diverticulosis, with no large bowel wall thickening or pericolonic fat stranding. Oral contrast reaches the transverse colon. Vascular/Lymphatic: Atherosclerotic nonaneurysmal abdominal aorta. Patent portal, splenic, hepatic and renal veins. No pathologically enlarged lymph nodes in the abdomen or pelvis. Reproductive: Status posthysterectomy. Cystic 7.8 x 5.0 cm right adnexal mass (series 8/ image 71) with a mildly thickened eccentric internal septation and no appreciable solid mural nodularity. No left adnexal mass. Other: No pneumoperitoneum, ascites or focal fluid collection. Musculoskeletal: No aggressive appearing focal osseous lesions. Moderate to marked thoracolumbar spondylosis. IMPRESSION: 1. Avidly enhancing infiltrative appearing 5.2 x 3.3 x 4.2 cm gallbladder mass replacing much of the gallbladder, worrisome for primary gallbladder carcinoma. Surgical consultation advised. 2. Nonspecific ill-defined heterogeneity in the subcapsular liver adjacent to the gallbladder, for which direct invasion by gallbladder tumor cannot be excluded. No additional potential findings of metastatic  disease in the abdomen or pelvis.  3. Cystic 7.8 cm right adnexal mass with mildly thickened eccentric internal septation. Differential for this finding includes right hydrosalpinx versus cystic right ovarian neoplasm. Consider GYN consultation and further characterization with transabdominal and transvaginal pelvic ultrasound . 4. No evidence of bowel obstruction or acute bowel inflammation. Diffuse colonic diverticulosis, with no evidence of acute diverticulitis. 5. Small hiatal hernia. 6. Aortic atherosclerosis.  Coronary atherosclerosis. Electronically Signed   By: Ilona Sorrel M.D.   On: 08/28/2016 15:49   Dg Chest 2 View  Result Date: 08/25/2016 CLINICAL DATA:  Chest pain.  Weakness. EXAM: CHEST  2 VIEW COMPARISON:  Radiograph 11/02/2015, additional priors FINDINGS: The cardiomediastinal contours are unchanged with borderline mild cardiomegaly and atherosclerosis of the aortic arch. Probable emphysema. Linear atelectasis or scarring at the left lung base. Pulmonary vasculature is normal. No consolidation, pleural effusion, or pneumothorax. No acute osseous abnormalities are seen. IMPRESSION: 1. No acute abnormality. 2. Stable mild cardiomegaly and thoracic aortic atherosclerosis. Probable emphysema. Electronically Signed   By: Jeb Levering M.D.   On: 08/25/2016 21:14   Nm Myocar Multi W/spect W/wall Motion / Ef  Result Date: 08/28/2016  There was no ST segment deviation noted during stress.  Defect 1: There is a medium defect of moderate severity.  This is a low risk study.  Nuclear stress EF: 59%.  No T wave inversion was noted during stress.  Moderate size and intensity fixed inferoseptal perfusion defect, suspect attenuation artifact. No significant reversible ischemia. LVEF 59% with normal wall motion. This is a low risk study.    ASSESSMENT & PLAN: 76 year old female without significant past medical history except smoking and alcohol abuse, presented with left shoulder, chest and right flank pain.  1.Newly found  gallbladder mass 2. Atypical chest pain, stress test (-) 3. Right frank pain, secondary to #1? 4. Chronic LE edema  5. Right ovarian cystic lesion   Recommendations:  -I have reviewed her CT scan findings. The gallbladder mass was suspicious for malignancy, with possible direct liver invasion. No other distant metastasis on CT abd/pel  -vaginal [place] for further evaluation of her right ovarian mass  -I will present her case in our GI tumor conference tomorrow, and talk with surgeon Dr.Byerly to consider biopsy vs surgery. Will also set up her GYN oncology consult as outpt  -will check CA19.9 and CA 125  -I will communicate with primary team regarding further workup tomorrow    All questions were answered. The patient knows to call the clinic with any problems, questions or concerns.  This document serves as a record of services personally performed by Truitt Merle, MD. It was created on her behalf by Brandt Loosen, a trained medical scribe. The creation of this record is based on the scribe's personal observations and the provider's statements to them. This document has been checked and approved by the attending provider.     Truitt Merle, MD 08/28/2016 4:29 PM  Cell: [phone]

## 2016-08-28 NOTE — Progress Notes (Signed)
CT abd/pelvis ordered yesterday for patient's abdominal pain and flank pain -  IMPRESSION: 1. Avidly enhancing infiltrative appearing 5.2 x 3.3 x 4.2 cm gallbladder mass replacing much of the gallbladder, worrisome for primary gallbladder carcinoma. Surgical consultation advised. 2. Nonspecific ill-defined heterogeneity in the subcapsular liver adjacent to the gallbladder, for which direct invasion by gallbladder tumor cannot be excluded. No additional potential findings of metastatic disease in the abdomen or pelvis. 3. Cystic 7.8 cm right adnexal mass with mildly thickened eccentric internal septation. Differential for this finding includes right hydrosalpinx versus cystic right ovarian neoplasm. Consider GYN consultation and further characterization with transabdominal and transvaginal pelvic ultrasound . 4. No evidence of bowel obstruction or acute bowel inflammation. Diffuse colonic diverticulosis, with no evidence of acute diverticulitis. 5. Small hiatal hernia. 6. Aortic atherosclerosis.  Coronary atherosclerosis.  Reviewed with Dr. Debara Pickett. At this point, no further workup planned from a cardiac standpoint given benign stress test. We suspect the above abnormalities are the source of her recent pain. She relays a several-day hx of diffuse abdominal pain radiating to flank, across whole abdomen, with a few episodes of n/v. She has not had any pain this afternoon fortunately. I reviewed the above findings with the patient who seemed to comprehend this but did not provide much feedback. I asked her if I could call anybody for her; she plans to call family when she's discharged but otherwise did not request for me to. I contacted internal medicine to transfer care as it is not really necessary for cardiology to maintain her hospitalization any longer given no active cardiac issues. Dr. Wendee Beavers stated he is off tomorrow but that someone from hospitalist team will assume care tomorrow. I also  contacted Dr. Burr Medico to discuss the scan; she will also see the patient. We greatly appreciate the assistance.   Lastly, will plan to add potassium for her recent hypokalemia (suspect due to comboLasix and HCTZ), order PT eval per nurse (limited mobility), add multivitamin and thiamine given alcohol use. No signs of withdrawal at this point. Of note she relies on transportation services to get to her appts so I suspect social resources are limited.   We have arranged for her to see [place] back 09/26/16 in clinic for post-hospital follow-up (also notified clinic of h/o bed bugs).   Cebastian Neis PA-C

## 2016-08-28 NOTE — Progress Notes (Signed)
Bed bugs in the home  CM has very little to offer in situations like this. Patient will have to have an exterminator or consult someone at [facility] or [employer] for the best method of eradication of pest. CM also talked to Judson Roch SW, nothing more to offer. Mindi Slicker [facility] [phone]

## 2016-08-28 NOTE — Progress Notes (Signed)
ANTICOAGULATION CONSULT NOTE  Pharmacy Consult for Heparin Indication: chest pain/ACS  No Known Allergies  Patient Measurements: Height: 5\' 6"  (167.6 cm) Weight: 151 lb 6.4 oz (68.7 kg) (Scale A) IBW/kg (Calculated) : 59.3 Heparin Dosing Weight: 69 kg  Vital Signs: Temp: 98.9 F (37.2 C) (07/10 0518) Temp Source: Oral (07/10 0518) BP: 135/73 (07/10 0518) Pulse Rate: 73 (07/10 0518)  Labs:  Recent Labs  08/25/16 1957  08/26/16 0811 08/26/16 1341  08/26/16 1904 08/27/16 0458 08/27/16 1431 08/27/16 2102 08/28/16 0458  HGB 14.0  --  13.8  --   --   --  13.5  --   --  13.5  HCT 40.1  --  40.0  --   --   --  40.5  --   --  40.9  PLT 253  --  244  --   --   --  253  --   --  253  LABPROT  --   --  14.0  --   --   --   --   --   --   --   INR  --   --  1.08  --   --   --   --   --   --   --   HEPARINUNFRC  --   --   --   --   < > <0.10* 0.12* 0.35 0.30 0.23*  CREATININE 0.57  --  0.51  --   --   --   --   --   --   --   TROPONINI  --   < >  --  0.10*  --  0.10* 0.19*  --   --   --   < > = values in this interval not displayed.  Estimated Creatinine Clearance: 57.8 mL/min (by C-G formula based on SCr of 0.51 mg/dL).  Assessment: 76 y.o. female with chest pain for heparin    Goal of Therapy:  Heparin level 0.3-0.7 units/ml Monitor platelets by anticoagulation protocol: Yes   Plan:  Increase Heparin 1450 units/hr  Phillis Knack, PharmD, BCPS  08/28/2016,6:36 AM

## 2016-08-28 NOTE — Progress Notes (Signed)
Student RN teaching opportunity targeted at the prevention of bed sores and pressure ulcers. Patient has redness to sacral area however skin is intact at this time. Will continue to assess patient's bottom and turn every 2-4 hours.

## 2016-08-29 DIAGNOSIS — Z720 Tobacco use: Secondary | ICD-10-CM

## 2016-08-29 DIAGNOSIS — R072 Precordial pain: Secondary | ICD-10-CM

## 2016-08-29 DIAGNOSIS — F1010 Alcohol abuse, uncomplicated: Secondary | ICD-10-CM

## 2016-08-29 DIAGNOSIS — I10 Essential (primary) hypertension: Secondary | ICD-10-CM

## 2016-08-29 LAB — BASIC METABOLIC PANEL
Anion gap: 12 (ref 5–15)
BUN: 11 mg/dL (ref 6–20)
CO2: 27 mmol/L (ref 22–32)
Calcium: 9 mg/dL (ref 8.9–10.3)
Chloride: 91 mmol/L — ABNORMAL LOW (ref 101–111)
Creatinine, Ser: 0.72 mg/dL (ref 0.44–1.00)
GFR calc Af Amer: >60 mL/min (ref >60)
GFR calc non Af Amer: >60 mL/min (ref >60)
GLUCOSE: 111 mg/dL — AB (ref 65–99)
POTASSIUM: 3.3 mmol/L — AB (ref 3.5–5.1)
Sodium: 130 mmol/L — ABNORMAL LOW (ref 135–145)

## 2016-08-29 LAB — GLUCOSE, CAPILLARY: Glucose-Capillary: 115 mg/dL — ABNORMAL HIGH (ref 65–99)

## 2016-08-29 LAB — TSH: TSH: 5.424 u[IU]/mL — ABNORMAL HIGH (ref 0.350–4.500)

## 2016-08-29 MED ORDER — ENOXAPARIN SODIUM 40 MG/0.4ML ~~LOC~~ SOLN
40.0000 mg | SUBCUTANEOUS | Status: DC
  Administered 2016-08-29 – 2016-08-30 (×2): 40 mg via SUBCUTANEOUS
  Filled 2016-08-29 (×2): qty 0.4

## 2016-08-29 NOTE — Evaluation (Signed)
Occupational Therapy Evaluation Patient Details Name: Ann Goodwin MRN: [id number] DOB: February 27, 1940 Today's Date: 08/29/2016    History of Present Illness Pt is a 76 yo female admitted with L shoulder pain, CT scan of abdomen revealed possible carcinoma in gallbladder which could be source of pain. PMH for HTN, alcoholism, chronic tobacco use, chronic LE venous stasis.   Clinical Impression   Pt is not able to report her PLOF due to impaired cognition other than that she used a walker. Pt presents with generalized weakness and poor standing balance requiring heavy moderate assistance to stand and pivot to chair. Pt currently needs total assist for ADL with the exception of drinking. Pt will need ST rehab in SNF upon d/c. Will follow acutely.    Follow Up Recommendations  SNF;Supervision/Assistance - 24 hour    Equipment Recommendations       Recommendations for Other Services       Precautions / Restrictions Precautions Precautions: Fall Restrictions Weight Bearing Restrictions: No      Mobility Bed Mobility Overal bed mobility: Needs Assistance Bed Mobility: Supine to Sit     Supine to sit: Mod assist     General bed mobility comments: mod A for trunk to upright, LE management off EoB and pad scoot to EoB  Transfers Overall transfer level: Needs assistance Equipment used: Rolling walker (2 wheeled) Transfers: Sit to/from [facility] Sit to Stand: Mod assist Stand pivot transfers: Mod assist       General transfer comment: assist to rise and shift weight over her feet, increased posterior lean, cues for hand placement with walker and to guide, took short shuffling steps to chair    Balance Overall balance assessment: Needs assistance   Sitting balance-Leahy Scale: Fair Sitting balance - Comments: sat EOB without support statically   Standing balance support: Bilateral upper extremity supported Standing balance-Leahy Scale: Poor Standing  balance comment: requires moderate assist                           ADL either performed or assessed with clinical judgement   ADL Overall ADL's : Needs assistance/impaired   Eating/Feeding Details (indicate cue type and reason): observed to drink from cup with straw                                 Functional mobility during ADLs: +2 for safety/equipment;Moderate assistance;Rolling walker General ADL Comments: Pt currently requires total assist for ADL. Per NT she was not able to feed herself today. Pt with urinary incontinence in the bed without awareness, assisted to clean up and transfer to chair for NT to change linen.     Vision Patient Visual Report: No change from baseline       Perception     Praxis      Pertinent Vitals/Pain Pain Assessment: Faces Faces Pain Scale: Hurts even more Pain Location: R foot Pain Descriptors / Indicators: Guarding;Grimacing Pain Intervention(s): Monitored during session;Repositioned     Hand Dominance Right   Extremity/Trunk Assessment Upper Extremity Assessment Upper Extremity Assessment: Generalized weakness   Lower Extremity Assessment Lower Extremity Assessment: Defer to PT evaluation       Communication Communication Communication: No difficulties   Cognition Arousal/Alertness: Awake/alert;Lethargic Behavior During Therapy: Flat affect Overall Cognitive Status: No family/caregiver present to determine baseline cognitive functioning  General Comments       Exercises     Shoulder Instructions      Home Living Family/patient expects to be discharged to:: Private residence Living Arrangements: Children (sons) Available Help at Discharge: Available PRN/intermittently;Family Type of Home: House Home Access: Stairs to enter Technical brewer of Steps: 3 Entrance Stairs-Rails: None Home Layout: One level     Bathroom Shower/Tub:  Tub/shower unit (does not use)   Bathroom Toilet: Standard     Home Equipment: Environmental consultant - 2 wheels          Prior Functioning/Environment Level of Independence: Independent        Comments: pt is questionable historian, she appears to have poor hygiene        OT Problem List: Decreased strength;Decreased activity tolerance;Impaired balance (sitting and/or standing);Decreased coordination;Decreased cognition;Decreased knowledge of use of DME or AE;Decreased safety awareness      OT Treatment/Interventions: Self-care/ADL training;DME and/or AE instruction;Patient/family education;Balance training;Therapeutic activities    OT Goals(Current goals can be found in the care plan section) Acute Rehab OT Goals Patient Stated Goal: get stronger OT Goal Formulation: With patient Time For Goal Achievement: 09/12/16 Potential to Achieve Goals: Good ADL Goals Pt Will Perform Eating: with supervision;sitting;with set-up Pt Will Perform Grooming: with min assist;sitting Pt Will Perform Upper Body Bathing: sitting;with mod assist Pt Will Perform Upper Body Dressing: with min assist;sitting Pt Will Transfer to Toilet: with mod assist;ambulating;bedside commode Additional ADL Goal #1: Pt will perform bed mobility with min assist in preparation for ADL.  OT Frequency: Min 2X/week   Barriers to D/C:            Co-evaluation              AM-PAC PT "6 Clicks" Daily Activity     Outcome Measure Help from another person eating meals?: A Lot Help from another person taking care of personal grooming?: Total Help from another person toileting, which includes using toliet, bedpan, or urinal?: Total Help from another person bathing (including washing, rinsing, drying)?: Total Help from another person to put on and taking off regular upper body clothing?: Total Help from another person to put on and taking off regular lower body clothing?: Total 6 Click Score: 7   End of Session Equipment  Utilized During Treatment: Gait belt;Rolling walker Nurse Communication: Mobility status  Activity Tolerance: Patient tolerated treatment well Patient left: in chair;with call bell/phone within reach;with chair alarm set;with nursing/sitter in room  OT Visit Diagnosis: Unsteadiness on feet (R26.81);Pain;Other symptoms and signs involving cognitive function;Muscle weakness (generalized) (M62.81)                Time: 7353-2992 OT Time Calculation (min): 23 min Charges:  OT General Charges $OT Visit: 1 Procedure OT Evaluation $OT Eval Moderate Complexity: 1 Procedure OT Treatments $Self Care/Home Management : 8-22 mins G-Codes:     Malka So 08/29/2016, 5:03 PM  [phone]

## 2016-08-29 NOTE — Care Management Important Message (Signed)
Important Message  Patient Details  Name: [name] MRN: [id number] Date of Birth: 1940/07/07   Medicare Important Message Given:  Yes    Murphy Bundick [address]ine [address] 08/29/2016, 1:02 PM

## 2016-08-29 NOTE — Progress Notes (Signed)
Patient very weak and could not stand to get weighed this morning. Had to do a bed weight. Patient seemed a little confused last night.

## 2016-08-29 NOTE — Progress Notes (Signed)
PROGRESS NOTE  Ann Goodwin FBP:[id number] DOB: September 14, 1940 DOA: 08/25/2016 PCP: Benito Mccreedy, MD   LOS: 4 days   Brief Narrative / Interim history: 76 y.o.femalewithout known CAD but h/o alcoholism, chronic tobacco use, chronic LE venous stasis presented with c/o left shoulder pain and chest pain to [facility] ED. She was admitted to cardiology service and underwent a stress test which was low risk. 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 Oncology consulted and care transferred to hospitalist service.   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 -CA-19-9 and CEA 125 pending  Chest pain -Cardiology followed and was initially undersurface, patient underwent a stress test on 7/10 which showed medium defect of moderate severity, low risk study overall, without any significant reversible ischemia. -Continue aspirin -Initially was on a heparin drip, now discontinued  Hyponatremia -Discontinue HCTZ, continue Lasix,  Hyperlipidemia -Continue atorvastatin  Hypothyroidism -Continue Synthroid  Hypertension -Blood pressure controlled, continue Lasix  Alcohol use -Does not appear to be withdrawing currently   DVT prophylaxis: Lovenox Code Status: Full code Family Communication: Discussed with patient, no family at bedside Disposition Plan: Home vs SNF pending oncology workup  Consultants:   Oncology   Procedures:   Stress test  Antimicrobials:  None    Subjective: - no chest pain,  shortness of breath, no abdominal pain, nausea or vomiting.  Sleepy this morning, cannot really tell me why she is in the hospital  Objective: Vitals:   08/28/16 1126 08/28/16 2058 08/29/16 0513 08/29/16 0548  BP: 136/67 104/65 112/70   Pulse: 77 87 80   Resp: 20 20 18    Temp: 99 F (37.2 C) 98.9 F (37.2 C) 99.1 F (37.3 C)   TempSrc: Oral Oral Oral   SpO2: 98% 100% 92%   Weight:    69.4 kg (153 lb)  Height:        Intake/Output Summary (Last 24 hours) at 08/29/16 0641 Last data filed at 08/28/16 2336  Gross per 24 hour  Intake            238.1 ml  Output             1600 ml  Net          -1361.9 ml   Filed Weights   08/27/16 0538 08/28/16 0518 08/29/16 0548  Weight: 69 kg (152 lb 1.6 oz) 68.7 kg (151 lb 6.4 oz) 69.4 kg (153 lb)    Examination:  Vitals:   08/28/16 1126 08/28/16 2058 08/29/16 0513 08/29/16 0548  BP: 136/67 104/65 112/70   Pulse: 77 87 80   Resp: 20 20 18    Temp: 99 F (37.2 C) 98.9 F (37.2 C) 99.1 F (37.3 C)   TempSrc: Oral Oral Oral   SpO2: 98% 100% 92%   Weight:    69.4 kg (153 lb)  Height:        Constitutional: NAD Eyes: lids and conjunctivae normal ENMT: Mucous membranes are moist.  Neck: normal, supple, no masses Respiratory: clear to auscultation bilaterally, no wheezing, no crackles. Normal respiratory  effort. Cardiovascular: Regular rate and rhythm, no murmurs / rubs / gallops. No LE edema. 2+ pedal pulses.   Abdomen: no tenderness. Bowel sounds positive.  Skin: no rashes, lesions, ulcers. No induration Neurologic: non focal    Data Reviewed: I have independently reviewed following labs and imaging studies  CBC:  Recent Labs Lab 08/25/16 1957 08/26/16 0811 08/27/16 0458 08/28/16 0458  WBC 11.7* 8.5 9.6 10.0  NEUTROABS 9.2*  --   --   --   HGB 14.0 13.8 13.5 13.5  HCT 40.1 40.0 40.5 40.9  MCV 92.8 93.2 94.2 94.0  PLT 253 244 253 419   Basic Metabolic Panel:  Recent Labs Lab 08/25/16 1957 08/26/16 0811  08/29/16 0404  NA 138 138 130*  K 3.4* 3.7 3.3*  CL 104 104 91*  CO2 26 25 27   GLUCOSE 116* 107* 111*  BUN 5* 5* 11  CREATININE 0.57 0.51 0.72  CALCIUM 9.1 8.8* 9.0   GFR: Estimated Creatinine Clearance: 57.8 mL/min (by C-G formula based on SCr of 0.72 mg/dL). Liver Function Tests:  Recent Labs Lab 08/25/16 2042 08/26/16 0811  AST 17 15  ALT 8* 7*  ALKPHOS 94 93  BILITOT 0.5 1.0  PROT 8.5* 8.2*  ALBUMIN 3.8 3.6    Recent Labs Lab 08/25/16 2042  LIPASE 14   No results for input(s): AMMONIA in the last 168 hours. Coagulation Profile:  Recent Labs Lab 08/26/16 0811  INR 1.08   Cardiac Enzymes:  Recent Labs Lab 08/25/16 2042 08/26/16 0810 08/26/16 1341 08/26/16 1904 08/27/16 0458  TROPONINI 0.15* 0.15* 0.10* 0.10* 0.19*   BNP (last 3 results) No results for input(s): PROBNP in the last 8760 hours. HbA1C: No results for input(s): HGBA1C in the last 72 hours. CBG:  Recent Labs Lab 08/27/16 0704 08/28/16 0616 08/28/16 0749 08/29/16 0524  GLUCAP 124* 93 105* 115*   Lipid Profile:  Recent Labs  08/26/16 0810  CHOL 152  HDL 64  LDLCALC 72  TRIG 80  CHOLHDL 2.4   Thyroid Function Tests:  Recent Labs  08/26/16 0810  TSH 4.569*   Anemia Panel: No results for input(s): VITAMINB12, FOLATE, FERRITIN, TIBC, IRON, RETICCTPCT in the last 72 hours. Urine analysis:    Component Value Date/Time   COLORURINE STRAW (A) 08/27/2016 1057   APPEARANCEUR CLEAR 08/27/2016 1057   LABSPEC 1.003 (L) 08/27/2016 1057   PHURINE 7.0 08/27/2016 1057   GLUCOSEU NEGATIVE 08/27/2016 1057   HGBUR SMALL (A) 08/27/2016 1057   BILIRUBINUR NEGATIVE 08/27/2016 1057   KETONESUR NEGATIVE 08/27/2016 1057   PROTEINUR NEGATIVE 08/27/2016 1057   UROBILINOGEN 1.0 09/21/2012 2140   NITRITE NEGATIVE 08/27/2016 1057   LEUKOCYTESUR NEGATIVE 08/27/2016 1057   Sepsis Labs: Invalid input(s): PROCALCITONIN, LACTICIDVEN  No results found for this or any previous visit (from  the past 240 hour(s)).    Radiology Studies: Ct Abdomen Pelvis W Wo Contrast  Result Date: 08/28/2016 CLINICAL DATA:  Right lower abdominal pain with nausea since last night. Prior hysterectomy. Inpatient. EXAM: CT ABDOMEN AND PELVIS WITHOUT AND WITH CONTRAST TECHNIQUE: Multidetector CT imaging of the abdomen and pelvis was performed following the standard protocol before and following the bolus administration of intravenous contrast. CONTRAST:  165mL ISOVUE-300 IOPAMIDOL (ISOVUE-300) INJECTION 61% COMPARISON:  None. FINDINGS: Lower chest: Mild scarring versus atelectasis in the dependent lung bases bilaterally. Coronary atherosclerosis. Hepatobiliary: There is an avidly enhancing infiltrative appearing 5.2 x 3.3 x 4.2 cm mass involving much of the gallbladder (series 8/ image 35). No radiopaque  cholelithiasis. No definite pericholecystic fluid. Normal liver size. No definite liver surface irregularity. There is poorly marginated heterogeneous attenuation of the subcapsular liver adjacent to the gallbladder. No discrete liver masses. No biliary ductal dilatation. Common bile duct diameter 6 mm, top-normal. No radiopaque choledocholithiasis. Pancreas: Normal, with no mass or duct dilation. Spleen: Normal size. No mass. Adrenals/Urinary Tract: Normal adrenals. No renal stones. No hydronephrosis. Symmetric normal contrast nephrograms. Normal size kidneys. No renal masses. Normal bladder. Stomach/Bowel: Small hiatal hernia. Otherwise collapsed and grossly normal stomach. Normal caliber small bowel with no small bowel wall thickening. Candidate normal diminutive appendix. No pericecal inflammatory changes. Scattered mild to moderate colonic diverticulosis, with no large bowel wall thickening or pericolonic fat stranding. Oral contrast reaches the transverse colon. Vascular/Lymphatic: Atherosclerotic nonaneurysmal abdominal aorta. Patent portal, splenic, hepatic and renal veins. No pathologically enlarged lymph  nodes in the abdomen or pelvis. Reproductive: Status posthysterectomy. Cystic 7.8 x 5.0 cm right adnexal mass (series 8/ image 71) with a mildly thickened eccentric internal septation and no appreciable solid mural nodularity. No left adnexal mass. Other: No pneumoperitoneum, ascites or focal fluid collection. Musculoskeletal: No aggressive appearing focal osseous lesions. Moderate to marked thoracolumbar spondylosis. IMPRESSION: 1. Avidly enhancing infiltrative appearing 5.2 x 3.3 x 4.2 cm gallbladder mass replacing much of the gallbladder, worrisome for primary gallbladder carcinoma. Surgical consultation advised. 2. Nonspecific ill-defined heterogeneity in the subcapsular liver adjacent to the gallbladder, for which direct invasion by gallbladder tumor cannot be excluded. No additional potential findings of metastatic disease in the abdomen or pelvis. 3. Cystic 7.8 cm right adnexal mass with mildly thickened eccentric internal septation. Differential for this finding includes right hydrosalpinx versus cystic right ovarian neoplasm. Consider GYN consultation and further characterization with transabdominal and transvaginal pelvic ultrasound . 4. No evidence of bowel obstruction or acute bowel inflammation. Diffuse colonic diverticulosis, with no evidence of acute diverticulitis. 5. Small hiatal hernia. 6. Aortic atherosclerosis.  Coronary atherosclerosis. Electronically Signed   By: Ilona Sorrel M.D.   On: 08/28/2016 15:49   Nm Myocar Multi W/spect W/wall Motion / Ef  Result Date: 08/28/2016  There was no ST segment deviation noted during stress.  Defect 1: There is a medium defect of moderate severity.  This is a low risk study.  Nuclear stress EF: 59%.  No T wave inversion was noted during stress.  Moderate size and intensity fixed inferoseptal perfusion defect, suspect attenuation artifact. No significant reversible ischemia. LVEF 59% with normal wall motion. This is a low risk study.     Scheduled  Meds: . aspirin EC  81 mg Oral Daily  . atorvastatin  80 mg Oral q1800  . cholecalciferol  5,000 Units Oral Daily  . furosemide  20 mg Oral Daily  . levothyroxine  50 mcg Oral QAC breakfast  . multivitamin with minerals  1 tablet Oral Daily  . potassium chloride  20 mEq Oral Daily  . sodium chloride flush  3 mL Intravenous Q12H  . thiamine  100 mg Oral Daily   Continuous Infusions:   Marzetta Board, MD, PhD [facility] Pager [phone]  If 7PM-7AM, please contact night-coverage [email or web address] Password [facility] 08/29/2016, 6:41 AM

## 2016-08-29 NOTE — Progress Notes (Signed)
Oncology brief note  I have discussed the case with surgeon Dr. Barry Dienes. She recommend MRI abd with and without contrast with MRCP, will hold on biopsy for now. I will take the liberty to order the scan. Please refer her to see Dr. Barry Dienes as outpt, if she is stable enough to be discharged, or consult surgery if needed.   Please call me if you have questions.   Truitt Merle  08/29/2016

## 2016-08-30 ENCOUNTER — Inpatient Hospital Stay (HOSPITAL_COMMUNITY): Payer: Medicare Other

## 2016-08-30 DIAGNOSIS — F1020 Alcohol dependence, uncomplicated: Secondary | ICD-10-CM

## 2016-08-30 DIAGNOSIS — K828 Other specified diseases of gallbladder: Secondary | ICD-10-CM

## 2016-08-30 DIAGNOSIS — R748 Abnormal levels of other serum enzymes: Secondary | ICD-10-CM

## 2016-08-30 LAB — CBC
HCT: 40.6 % (ref 36.0–46.0)
Hemoglobin: 13.7 g/dL (ref 12.0–15.0)
MCH: 31.5 pg (ref 26.0–34.0)
MCHC: 33.7 g/dL (ref 30.0–36.0)
MCV: 93.3 fL (ref 78.0–100.0)
PLATELETS: 296 10*3/uL (ref 150–400)
RBC: 4.35 MIL/uL (ref 3.87–5.11)
RDW: 13.7 % (ref 11.5–15.5)
WBC: 8.8 10*3/uL (ref 4.0–10.5)

## 2016-08-30 LAB — COMPREHENSIVE METABOLIC PANEL
ALK PHOS: 112 U/L (ref 38–126)
ALT: 16 U/L (ref 14–54)
AST: 30 U/L (ref 15–41)
Albumin: 2.6 g/dL — ABNORMAL LOW (ref 3.5–5.0)
Anion gap: 11 (ref 5–15)
BILIRUBIN TOTAL: 1.4 mg/dL — AB (ref 0.3–1.2)
BUN: 14 mg/dL (ref 6–20)
CALCIUM: 8.7 mg/dL — AB (ref 8.9–10.3)
CO2: 27 mmol/L (ref 22–32)
CREATININE: 0.73 mg/dL (ref 0.44–1.00)
Chloride: 93 mmol/L — ABNORMAL LOW (ref 101–111)
GFR calc Af Amer: >60 mL/min (ref >60)
Glucose, Bld: 98 mg/dL (ref 65–99)
Potassium: 3.5 mmol/L (ref 3.5–5.1)
Sodium: 131 mmol/L — ABNORMAL LOW (ref 135–145)
TOTAL PROTEIN: 7.7 g/dL (ref 6.5–8.1)

## 2016-08-30 LAB — CA 125: CANCER ANTIGEN (CA) 125: 8.8 U/mL (ref 0.0–38.1)

## 2016-08-30 LAB — CANCER ANTIGEN 19-9: CA 19-9: 47 U/mL — ABNORMAL HIGH (ref 0–35)

## 2016-08-30 LAB — GLUCOSE, CAPILLARY: GLUCOSE-CAPILLARY: 107 mg/dL — AB (ref 65–99)

## 2016-08-30 MED ORDER — MAGNESIUM HYDROXIDE 400 MG/5ML PO SUSP
5.0000 mL | Freq: Once | ORAL | Status: AC
  Administered 2016-08-30: 5 mL via ORAL
  Filled 2016-08-30: qty 30

## 2016-08-30 MED ORDER — GADOBENATE DIMEGLUMINE 529 MG/ML IV SOLN
15.0000 mL | Freq: Once | INTRAVENOUS | Status: AC | PRN
  Administered 2016-08-30: 13 mL via INTRAVENOUS

## 2016-08-30 NOTE — Clinical Social Work Note (Signed)
CSW facilitated patient discharge including contacting patient family (left voicemail for Elenore Rota and Jori Moll came on the unit after transport called. CSW notified him) and facility to confirm patient discharge plans. Clinical information faxed to facility and family agreeable with plan. CSW arranged ambulance transport via [facility] to [facility]. RN to call report prior to discharge [phone]).  CSW will sign off for now as social work intervention is no longer needed. Please consult [place] again if new needs arise.  Dayton Scrape, [place]

## 2016-08-30 NOTE — Clinical Social Work Placement (Signed)
   CLINICAL SOCIAL WORK PLACEMENT  NOTE  Date:  08/30/2016  Patient Details  Name: Ann Goodwin MRN: [id number] Date of Birth: Oct 22, 1940  Clinical Social Work is seeking post-discharge placement for this patient at the [place] level of care (*CSW will initial, date and re-position this form in  chart as items are completed):  Yes   Patient/family provided with [place] Work Department's list of facilities offering this level of care within the geographic area requested by the patient (or if unable, by the patient's family).  Yes   Patient/family informed of their freedom to choose among providers that offer the needed level of care, that participate in Medicare, Medicaid or managed care program needed by the patient, have an available bed and are willing to accept the patient.  Yes   Patient/family informed of [facility] [place]'s ownership interest in [facility] and [facility], as well as of the fact that they are under no obligation to receive care at these facilities.  PASRR submitted to EDS on 08/30/16     PASRR number received on 08/30/16     Existing PASRR number confirmed on       FL2 transmitted to all facilities in geographic area requested by pt/family on 08/30/16     FL2 transmitted to all facilities within larger geographic area on       Patient informed that his/her managed care company has contracts with or will negotiate with certain facilities, including the following:        Yes   Patient/family informed of bed offers received.  Patient [facility] bed at [facility]     Physician recommends and patient chooses bed at      Patient to be transferred to [facility] on 08/30/16.  Patient to be transferred to facility by [facility]     Patient family notified on 08/30/16 of transfer.  Name of family member notified:  Left voicemail for [name] (son)     PHYSICIAN Please prepare prescriptions      Additional Comment:    _______________________________________________ Candie Chroman, LCSW 08/30/2016, 4:07 PM

## 2016-08-30 NOTE — Clinical Social Work Placement (Signed)
   CLINICAL SOCIAL WORK PLACEMENT  NOTE  Date:  08/30/2016  Patient Details  Name: Ann Goodwin MRN: [id number] Date of Birth: 29-Jul-1940  Clinical Social Work is seeking post-discharge placement for this patient at the [place] level of care (*CSW will initial, date and re-position this form in  chart as items are completed):  Yes   Patient/family provided with [place] Work Department's list of facilities offering this level of care within the geographic area requested by the patient (or if unable, by the patient's family).  Yes   Patient/family informed of their freedom to choose among providers that offer the needed level of care, that participate in Medicare, Medicaid or managed care program needed by the patient, have an available bed and are willing to accept the patient.  Yes   Patient/family informed of [place]'s ownership interest in [facility] and [facility], as well as of the fact that they are under no obligation to receive care at these facilities.  PASRR submitted to EDS on 08/30/16     PASRR number received on 08/30/16     Existing PASRR number confirmed on       FL2 transmitted to all facilities in geographic area requested by pt/family on 08/30/16     FL2 transmitted to all facilities within larger geographic area on       Patient informed that his/her managed care company has contracts with or will negotiate with certain facilities, including the following:            Patient/family informed of bed offers received.  Patient chooses bed at       Physician recommends and patient chooses bed at      Patient to be transferred to   on  .  Patient to be transferred to facility by       Patient family notified on   of transfer.  Name of family member notified:        PHYSICIAN Please sign FL2     Additional Comment:    _______________________________________________ Candie Chroman, LCSW 08/30/2016,  11:35 AM

## 2016-08-30 NOTE — Progress Notes (Signed)
Report called to Janett Billow at [facility].  Son at bedside visiting with patient. Awaiting transport.

## 2016-08-30 NOTE — Progress Notes (Signed)
MRI tech called, patient needs to be NPO for MRI.

## 2016-08-30 NOTE — Discharge Instructions (Signed)
Follow with Benito Mccreedy, MD in 5-7 days  Please get a complete blood count and chemistry panel checked by your Primary MD at your next visit, and again as instructed by your Primary MD. Please get your medications reviewed and adjusted by your Primary MD.  Please request your Primary MD to go over all Hospital Tests and Procedure/Radiological results at the follow up, please get all Hospital records sent to your Prim MD by signing hospital release before you go home.  If you had Pneumonia of Lung problems at the Hospital: Please get a 2 view Chest X ray done in 6-8 weeks after hospital discharge or sooner if instructed by your Primary MD.  If you have Congestive Heart Failure: Please call your Cardiologist or Primary MD anytime you have any of the following symptoms:  1) 3 pound weight gain in 24 hours or 5 pounds in 1 week  2) shortness of breath, with or without a dry hacking cough  3) swelling in the hands, feet or stomach  4) if you have to sleep on extra pillows at night in order to breathe  Follow cardiac low salt diet and 1.5 lit/day fluid restriction.  If you have diabetes Accuchecks 4 times/day, Once in AM empty stomach and then before each meal. Log in all results and show them to your primary doctor at your next visit. If any glucose reading is under 80 or above 300 call your primary MD immediately.  If you have Seizure/Convulsions/Epilepsy: Please do not drive, operate heavy machinery, participate in activities at heights or participate in high speed sports until you have seen by Primary MD or a Neurologist and advised to do so again.  If you had Gastrointestinal Bleeding: Please ask your Primary MD to check a complete blood count within one week of discharge or at your next visit. Your endoscopic/colonoscopic biopsies that are pending at the time of discharge, will also need to followed by your Primary MD.  Get Medicines reviewed and adjusted. Please take all your  medications with you for your next visit with your Primary MD  Please request your Primary MD to go over all hospital tests and procedure/radiological results at the follow up, please ask your Primary MD to get all Hospital records sent to his/her office.  If you experience worsening of your admission symptoms, develop shortness of breath, life threatening emergency, suicidal or homicidal thoughts you must seek medical attention immediately by calling 911 or calling your MD immediately  if symptoms less severe.  You must read complete instructions/literature along with all the possible adverse reactions/side effects for all the Medicines you take and that have been prescribed to you. Take any new Medicines after you have completely understood and accpet all the possible adverse reactions/side effects.   Do not drive or operate heavy machinery when taking Pain medications.   Do not take more than prescribed Pain, Sleep and Anxiety Medications  Special Instructions: If you have smoked or chewed Tobacco  in the last 2 yrs please stop smoking, stop any regular Alcohol  and or any Recreational drug use.  Wear Seat belts while driving.  Please note You were cared for by a hospitalist during your hospital stay. If you have any questions about your discharge medications or the care you received while you were in the hospital after you are discharged, you can call the unit and asked to speak with the hospitalist on call if the hospitalist that took care of you is not available. Once  you are discharged, your primary care physician will handle any further medical issues. Please note that NO REFILLS for any discharge medications will be authorized once you are discharged, as it is imperative that you return to your primary care physician (or establish a relationship with a primary care physician if you do not have one) for your aftercare needs so that they can reassess your need for medications and monitor your  lab values.  You can reach the hospitalist office at phone [phone] or fax [phone]   If you do not have a primary care physician, you can call [phone] for a physician referral.  Activity: As tolerated with Full fall precautions use walker/cane & assistance as needed  Diet: regular  Disposition Home

## 2016-08-30 NOTE — NC FL2 (Signed)
[place] LEVEL OF CARE SCREENING TOOL     IDENTIFICATION  Patient Name: Ann Goodwin Birthdate: [birth year] Sex: female Admission Date (Current Location): 08/25/2016  [facility] and [place] Number:  Herbalist and Address:  [facility]. [facility], [place] [address], [place], [place] [address]      Provider Number: [id number]  Attending Physician Name and Address:  Caren Griffins, MD  Relative Name and Phone Number:       Current Level of Care: Hospital Recommended Level of Care: [place] Prior Approval Number:    Date Approved/Denied:   PASRR Number: [id number]  Discharge Plan: SNF    Current Diagnoses: Patient Active Problem List   Diagnosis Date Noted  . Dilated aortic root ([place]) 08/28/2016  . Infestation by bed bug 08/28/2016  . Tobacco abuse 08/28/2016  . Acute pain of left shoulder   . Elevated troponin   . Cellulitis 11/02/2015  . Hypertension 11/02/2015  . Alcohol dependence ([place]) 11/02/2015  . Hypokalemia 11/02/2015  . 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 Chest pain 06/01/2013    Orientation RESPIRATION BLADDER Height & Weight     Self, Time, Situation, Place  Normal Incontinent, External catheter (Incontinent at times) Weight: 146 lb 8 oz (66.5 kg) Height:  5\' 6"  (167.6 cm)  BEHAVIORAL SYMPTOMS/MOOD NEUROLOGICAL BOWEL NUTRITION STATUS   (None)  (None) Continent Diet (Heart healthy)  AMBULATORY STATUS COMMUNICATION OF NEEDS Skin   Extensive Assist Verbally Other (Comment) (Blister, cracking)                       Personal Care Assistance Level of Assistance  Bathing, Feeding, Dressing Bathing Assistance: Maximum assistance Feeding assistance: Limited assistance Dressing Assistance: Maximum assistance     Functional Limitations Info  Sight, Hearing, Speech Sight Info: Adequate Hearing Info: Adequate Speech Info: Adequate    SPECIAL CARE FACTORS FREQUENCY  PT (By licensed  PT), Blood pressure, OT (By licensed OT)     PT Frequency: 5 x week OT Frequency: 5 x week            Contractures Contractures Info: Not present    Additional Factors Info  Code Status, Allergies Code Status Info: Full Allergies Info: NKDA           Current Medications (08/30/2016):  This is the current hospital active medication list Current Facility-Administered Medications  Medication Dose Route Frequency Provider Last Rate Last Dose  . acetaminophen (TYLENOL) tablet 650 mg  650 mg Oral Q4H PRN Barrett, Evelene Croon, PA-C      . ALPRAZolam (XANAX) tablet 0.25 mg  0.25 mg Oral BID PRN Barrett, Evelene Croon, PA-C      . aspirin EC tablet 81 mg  81 mg Oral Daily Geralynn Ochs T, MD   81 mg at 08/30/16 1119  . atorvastatin (LIPITOR) tablet 80 mg  80 mg Oral q1800 Geralynn Ochs T, MD   80 mg at 08/29/16 1800  . cholecalciferol (VITAMIN D) tablet 5,000 Units  5,000 Units Oral Daily Geralynn Ochs T, MD   5,000 Units at 08/30/16 1119  . enoxaparin (LOVENOX) injection 40 mg  40 mg Subcutaneous Q24H Marzetta Board M, MD   40 mg at 08/30/16 1120  . furosemide (LASIX) tablet 20 mg  20 mg Oral Daily Geralynn Ochs T, MD   20 mg at 08/30/16 1120  . levothyroxine (SYNTHROID, LEVOTHROID) tablet 50 mcg  50 mcg Oral QAC breakfast Geralynn Ochs  T, MD   50 mcg at 08/30/16 0807  . multivitamin with minerals tablet 1 tablet  1 tablet Oral Daily Dunn, Dayna N, PA-C   1 tablet at 08/30/16 1120  . ondansetron (ZOFRAN) injection 4 mg  4 mg Intravenous Q6H PRN Barrett, Rhonda G, PA-C      . potassium chloride SA (K-DUR,KLOR-CON) CR tablet 20 mEq  20 mEq Oral Daily Dunn, Dayna N, PA-C   20 mEq at 08/30/16 1120  . sodium chloride flush (NS) 0.9 % injection 3 mL  3 mL Intravenous Q12H Geralynn Ochs T, MD   3 mL at 08/30/16 1121  . thiamine (VITAMIN B-1) tablet 100 mg  100 mg Oral Daily Dunn, Dayna N, PA-C   100 mg at 08/30/16 1119  . traMADol (ULTRAM) tablet 50-100 mg  50-100 mg Oral Q6H PRN Barrett, Evelene Croon, PA-C   50 mg at 08/28/16 2347  . zolpidem (AMBIEN) tablet 5 mg  5 mg Oral QHS PRN Barrett, Evelene Croon, PA-C         Discharge Medications: Please see discharge summary for a list of discharge medications.  Relevant Imaging Results:  Relevant Lab Results:   Additional Information SS#: [id number]. Reports bed bugs at home. No indication of them here in the hospital. Patient understands she cannot bring her clothes from home and sons that live with her cannot visit.  Candie Chroman, LCSW

## 2016-08-30 NOTE — Clinical Social Work Note (Signed)
CSW coworker provided patient with bed offers. Patient chose [facility]. They are able to take her today. MD notified and will start working on her discharge.  Dayton Scrape, [place]

## 2016-08-30 NOTE — Progress Notes (Deleted)
PROGRESS NOTE  Ann Goodwin KYH:[id number] DOB: Feb 09, 1941 DOA: 08/25/2016 PCP: Benito Mccreedy, MD   LOS: 5 days   Brief Narrative / Interim history: 76 y.o.femalewithout known CAD but h/o alcoholism, chronic tobacco use, chronic LE venous stasis presented with c/o left shoulder pain and chest pain to [facility] ED. She was admitted to cardiology service and underwent a stress test which was low risk. 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 Oncology consulted and care transferred to hospitalist service.   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 Dr. Burr Medico to refer to surgery. Discussed with Dr. Barry Dienes, she will need evaluation as an outpatient in 1-2 weeks. Plan to work on rehab/deconditioning in SNF setting meanwhile  -CA-19-9 elevated at 47, and CEA 125 normal at 8.8  Chest pain -Cardiology followed and was initially on their service, patient underwent a stress test on 7/10 which showed medium defect of moderate severity, low risk study overall, without any significant reversible ischemia. -Continue aspirin -Initially was on a heparin drip, now discontinued  Hyponatremia -Discontinue HCTZ, continue Lasix, Na improved  Hyperlipidemia -Continue atorvastatin  Hypothyroidism -Continue Synthroid  Hypertension -Blood pressure controlled, continue Lasix  Alcohol use -Does not appear to be withdrawing currently   DVT prophylaxis: Lovenox Code Status: Full code Family Communication: Discussed with patient, no family at bedside, called son, no answer, left message Disposition Plan: SNF pending  bed  Consultants:   Oncology   Procedures:   Stress test  Antimicrobials:  None    Subjective: - no chest pain, shortness of breath, no abdominal pain, nausea or vomiting.   Objective: Vitals:   08/30/16 0245 08/30/16 0544 08/30/16 1116 08/30/16 1214  BP:  118/67 119/74 116/61  Pulse:  73 86 74  Resp:  20  20  Temp:  98.4 F (36.9 C)  98.5 F (36.9 C)  TempSrc:  Oral  Oral  SpO2:  97% 98% 100%  Weight: 66.5 kg (146 lb 8 oz)     Height:        Intake/Output Summary (Last 24 hours) at 08/30/16 1327 Last data filed at 08/30/16 1000  Gross per 24 hour  Intake              443 ml  Output              500 ml  Net              -57 ml   Filed Weights   08/28/16 0518 08/29/16 0548 08/30/16 0245  Weight: 68.7 kg (151 lb 6.4 oz) 69.4 kg (153 lb) 66.5 kg (146 lb 8 oz)    Examination:  Vitals:   08/30/16 0245 08/30/16 0544 08/30/16 1116 08/30/16 1214  BP:  118/67 119/74 116/61  Pulse:  73 86 74  Resp:  20  20  Temp:  98.4 F (36.9 C)  98.5 F (36.9 C)  TempSrc:  Oral  Oral  SpO2:  97% 98% 100%  Weight: 66.5 kg (146 lb 8 oz)     Height:        Constitutional: NAD, calm, comfortable Respiratory: clear to auscultation bilaterally, no  wheezing, no crackles. Cardiovascular: Regular rate and rhythm, no murmurs / rubs / gallops.     Data Reviewed: I have independently reviewed following labs and imaging studies  CBC:  Recent Labs Lab 08/25/16 1957 08/26/16 0811 08/27/16 0458 08/28/16 0458 08/30/16 0510  WBC 11.7* 8.5 9.6 10.0 8.8  NEUTROABS 9.2*  --   --   --   --   HGB 14.0 13.8 13.5 13.5 13.7  HCT 40.1 40.0 40.5 40.9 40.6  MCV 92.8 93.2 94.2 94.0 93.3  PLT 253 244 253 253 297   Basic Metabolic Panel:  Recent Labs Lab 08/25/16 1957 08/26/16 0811 08/29/16 0404 08/30/16 0510  NA 138 138 130* 131*  K 3.4* 3.7 3.3* 3.5  CL 104 104 91* 93*  CO2 26 25 27 27   GLUCOSE 116* 107* 111* 98  BUN 5* 5* 11 14  CREATININE 0.57 0.51 0.72 0.73  CALCIUM  9.1 8.8* 9.0 8.7*   GFR: Estimated Creatinine Clearance: 57.8 mL/min (by C-G formula based on SCr of 0.73 mg/dL). Liver Function Tests:  Recent Labs Lab 08/25/16 2042 08/26/16 0811 08/30/16 0510  AST 17 15 30   ALT 8* 7* 16  ALKPHOS 94 93 112  BILITOT 0.5 1.0 1.4*  PROT 8.5* 8.2* 7.7  ALBUMIN 3.8 3.6 2.6*    Recent Labs Lab 08/25/16 2042  LIPASE 14   No results for input(s): AMMONIA in the last 168 hours. Coagulation Profile:  Recent Labs Lab 08/26/16 0811  INR 1.08   Cardiac Enzymes:  Recent Labs Lab 08/25/16 2042 08/26/16 0810 08/26/16 1341 08/26/16 1904 08/27/16 0458  TROPONINI 0.15* 0.15* 0.10* 0.10* 0.19*   BNP (last 3 results) No results for input(s): PROBNP in the last 8760 hours. HbA1C: No results for input(s): HGBA1C in the last 72 hours. CBG:  Recent Labs Lab 08/27/16 0704 08/28/16 0616 08/28/16 0749 08/29/16 0524 08/30/16 0518  GLUCAP 124* 93 105* 115* 107*   Lipid Profile: No results for input(s): CHOL, HDL, LDLCALC, TRIG, CHOLHDL, LDLDIRECT in the last 72 hours. Thyroid Function Tests:  Recent Labs  08/29/16 1201  TSH 5.424*   Anemia Panel: No results for input(s): VITAMINB12, FOLATE, FERRITIN, TIBC, IRON, RETICCTPCT in the last 72 hours. Urine analysis:    Component Value Date/Time   COLORURINE STRAW (A) 08/27/2016 1057   APPEARANCEUR CLEAR 08/27/2016 1057   LABSPEC 1.003 (L) 08/27/2016 1057   PHURINE 7.0 08/27/2016 1057   GLUCOSEU NEGATIVE 08/27/2016 1057   HGBUR SMALL (A) 08/27/2016 1057   BILIRUBINUR NEGATIVE 08/27/2016 1057   KETONESUR NEGATIVE 08/27/2016 1057   PROTEINUR NEGATIVE 08/27/2016 1057   UROBILINOGEN 1.0 09/21/2012 2140   NITRITE NEGATIVE 08/27/2016 1057   LEUKOCYTESUR NEGATIVE 08/27/2016 1057   Sepsis Labs: Invalid input(s): PROCALCITONIN, LACTICIDVEN  No results found for this or any previous visit (from the past 240 hour(s)).    Radiology Studies: Ct Abdomen Pelvis W Wo Contrast  Result Date:  08/28/2016 CLINICAL DATA:  Right lower abdominal pain with nausea since last night. Prior hysterectomy. Inpatient. EXAM: CT ABDOMEN AND PELVIS WITHOUT AND WITH CONTRAST TECHNIQUE: Multidetector CT imaging of the abdomen and pelvis was performed following the standard protocol before and following the bolus administration of intravenous contrast. CONTRAST:  171mL ISOVUE-300 IOPAMIDOL (ISOVUE-300) INJECTION 61% COMPARISON:  None. FINDINGS: Lower chest: Mild scarring versus atelectasis in the dependent lung bases bilaterally. Coronary atherosclerosis. Hepatobiliary: There is an avidly enhancing infiltrative appearing 5.2 x 3.3 x 4.2 cm mass involving much of the gallbladder (series 8/ image 35). No  radiopaque cholelithiasis. No definite pericholecystic fluid. Normal liver size. No definite liver surface irregularity. There is poorly marginated heterogeneous attenuation of the subcapsular liver adjacent to the gallbladder. No discrete liver masses. No biliary ductal dilatation. Common bile duct diameter 6 mm, top-normal. No radiopaque choledocholithiasis. Pancreas: Normal, with no mass or duct dilation. Spleen: Normal size. No mass. Adrenals/Urinary Tract: Normal adrenals. No renal stones. No hydronephrosis. Symmetric normal contrast nephrograms. Normal size kidneys. No renal masses. Normal bladder. Stomach/Bowel: Small hiatal hernia. Otherwise collapsed and grossly normal stomach. Normal caliber small bowel with no small bowel wall thickening. Candidate normal diminutive appendix. No pericecal inflammatory changes. Scattered mild to moderate colonic diverticulosis, with no large bowel wall thickening or pericolonic fat stranding. Oral contrast reaches the transverse colon. Vascular/Lymphatic: Atherosclerotic nonaneurysmal abdominal aorta. Patent portal, splenic, hepatic and renal veins. No pathologically enlarged lymph nodes in the abdomen or pelvis. Reproductive: Status posthysterectomy. Cystic 7.8 x 5.0 cm right  adnexal mass (series 8/ image 71) with a mildly thickened eccentric internal septation and no appreciable solid mural nodularity. No left adnexal mass. Other: No pneumoperitoneum, ascites or focal fluid collection. Musculoskeletal: No aggressive appearing focal osseous lesions. Moderate to marked thoracolumbar spondylosis. IMPRESSION: 1. Avidly enhancing infiltrative appearing 5.2 x 3.3 x 4.2 cm gallbladder mass replacing much of the gallbladder, worrisome for primary gallbladder carcinoma. Surgical consultation advised. 2. Nonspecific ill-defined heterogeneity in the subcapsular liver adjacent to the gallbladder, for which direct invasion by gallbladder tumor cannot be excluded. No additional potential findings of metastatic disease in the abdomen or pelvis. 3. Cystic 7.8 cm right adnexal mass with mildly thickened eccentric internal septation. Differential for this finding includes right hydrosalpinx versus cystic right ovarian neoplasm. Consider GYN consultation and further characterization with transabdominal and transvaginal pelvic ultrasound . 4. No evidence of bowel obstruction or acute bowel inflammation. Diffuse colonic diverticulosis, with no evidence of acute diverticulitis. 5. Small hiatal hernia. 6. Aortic atherosclerosis.  Coronary atherosclerosis. Electronically Signed   By: Ilona Sorrel M.D.   On: 08/28/2016 15:49   Nm Myocar Multi W/spect W/wall Motion / Ef  Result Date: 08/28/2016  There was no ST segment deviation noted during stress.  Defect 1: There is a medium defect of moderate severity.  This is a low risk study.  Nuclear stress EF: 59%.  No T wave inversion was noted during stress.  Moderate size and intensity fixed inferoseptal perfusion defect, suspect attenuation artifact. No significant reversible ischemia. LVEF 59% with normal wall motion. This is a low risk study.   Mr Abdomen Mrcp Moise Boring Contast  Result Date: 08/30/2016 CLINICAL DATA:  Inpatient. Patient admitted with chest  and abdominal pain. Gallbladder mass suspicious for gallbladder carcinoma on CT study. EXAM: MRI ABDOMEN WITHOUT AND WITH CONTRAST (INCLUDING MRCP) TECHNIQUE: Multiplanar multisequence MR imaging of the abdomen was performed both before and after the administration of intravenous contrast. Heavily T2-weighted images of the biliary and pancreatic ducts were obtained, and three-dimensional MRCP images were rendered by post processing. CONTRAST:  32mL MULTIHANCE GADOBENATE DIMEGLUMINE 529 MG/ML IV SOLN COMPARISON:  08/28/2016 CT abdomen/ pelvis. FINDINGS: Significantly motion degraded scan. Lower chest: Mild scarring versus atelectasis at the dependent lung bases bilaterally, right greater than left, unchanged. Mild cardiomegaly. Hepatobiliary: There is slight hypertrophy of the lateral segment left liver lobe with possible slight liver surface irregularity. No hepatic steatosis. There is an irregular 5.8 x 3.9 x 4.7 cm avidly enhancing gallbladder mass centered in the upper body/ fundus (series 15/ image 71) with circumferential involvement  of the gallbladder wall. This gallbladder mass appears adherent to the undersurface of the adjacent right liver lobe, with possible minimal direct invasion of the adjacent subcapsular liver up to 3 mm depth (series 5/image 19). Otherwise no liver masses. No cholelithiasis. No pericholecystic fluid. No biliary ductal dilatation. Common bile duct diameter 6 mm, top-normal. No choledocholithiasis. Pancreas: No pancreatic mass or duct dilation.  No pancreas divisum. Spleen: Normal size. No mass. Adrenals/Urinary Tract: No discrete adrenal nodules. No hydronephrosis. Normal kidneys with no renal mass. Stomach/Bowel: Grossly normal stomach. Visualized small and large bowel is normal caliber, with no bowel wall thickening. Vascular/Lymphatic: Atherosclerotic nonaneurysmal abdominal aorta. Patent portal, splenic, hepatic and renal veins. No pathologically enlarged lymph nodes in the  abdomen. Other: No abdominal ascites or focal fluid collection. Musculoskeletal: No aggressive appearing focal osseous lesions. IMPRESSION: 1. Irregular 5.8 x 3.9 x 4.7 cm avidly enhancing gallbladder mass circumferentially involving the upper body/fundus of the gallbladder, suspicious for primary gallbladder carcinoma. Gallbladder mass appears adherent to the adjacent undersurface of the right liver lobe, cannot exclude slight invasion of the adjacent subcapsular liver up to 3 mm depth . 2. Otherwise no findings of metastatic disease in the abdomen. 3. No cholelithiasis.  No biliary ductal dilatation. 4. Possible early morphologic changes of cirrhosis. No secondary findings of portal hypertension. 5.  Aortic Atherosclerosis (ICD10-I70.0). Electronically Signed   By: Ilona Sorrel M.D.   On: 08/30/2016 07:41     Scheduled Meds: . aspirin EC  81 mg Oral Daily  . atorvastatin  80 mg Oral q1800  . cholecalciferol  5,000 Units Oral Daily  . enoxaparin (LOVENOX) injection  40 mg Subcutaneous Q24H  . furosemide  20 mg Oral Daily  . levothyroxine  50 mcg Oral QAC breakfast  . multivitamin with minerals  1 tablet Oral Daily  . potassium chloride  20 mEq Oral Daily  . sodium chloride flush  3 mL Intravenous Q12H  . thiamine  100 mg Oral Daily   Continuous Infusions:   Marzetta Board, MD, PhD [facility] Pager [phone]  If 7PM-7AM, please contact night-coverage [email or web address] Password [email or web address] 08/30/2016, 1:27 PM

## 2016-08-30 NOTE — Progress Notes (Signed)
Patient went down to MRI, off telemetry

## 2016-08-30 NOTE — Progress Notes (Signed)
Physical Therapy Treatment Patient Details Name: Ann Goodwin MRN: [id number] DOB: April 09, 1940 Today's Date: 08/30/2016    History of Present Illness Pt is a 76 yo female admitted with L shoulder pain, CT scan of abdomen revealed possible carcinoma in gallbladder which could be source of pain. PMH for HTN, alcoholism, chronic tobacco use, chronic LE venous stasis.    PT Comments    Patient tolerated short distance gait with RW and min A this session. Attempted gait without AD as pt reported she did not use AD PTA. Pt required mod A to ambulate without AD. Pt is a poor historian-not sure what her baseline is cognitively. Current plan remains appropriate.    Follow Up Recommendations  SNF     Equipment Recommendations  Other (comment) (to be determined at next venue)    Recommendations for Other Services OT consult     Precautions / Restrictions Precautions Precautions: Fall Restrictions Weight Bearing Restrictions: No    Mobility  Bed Mobility               General bed mobility comments: pt OOB in chair upon arrival  Transfers Overall transfer level: Needs assistance Equipment used: Rolling walker (2 wheeled);1 person hand held assist Transfers: Sit to/from Stand Sit to Stand: Min assist         General transfer comment: cues for safe hand placement and technique; assist to power up into standing from recliner and commode using grab bar  Ambulation/Gait Ambulation/Gait assistance: Mod assist;Min assist Ambulation Distance (Feet):  (87ft X2) Assistive device: Rolling walker (2 wheeled);1 person hand held assist Gait Pattern/deviations: Step-through pattern;Decreased step length - right;Decreased step length - left;Shuffle;Trunk flexed     General Gait Details: HHA a few feet in room but pt very unsteady and required mod A unless holding onto other surface; min A with use of RW for balance and RW management; cues proximity of RW and posture   Stairs            Wheelchair Mobility    Modified Rankin (Stroke Patients Only)       Balance Overall balance assessment: Needs assistance Sitting-balance support: Feet supported Sitting balance-Leahy Scale: Fair       Standing balance-Leahy Scale: Poor (fair with static stand at sink )                              Cognition Arousal/Alertness: Awake/alert Behavior During Therapy: WFL for tasks assessed/performed Overall Cognitive Status: No family/caregiver present to determine baseline cognitive functioning                                        Exercises      General Comments        Pertinent Vitals/Pain Pain Assessment: No/denies pain    Home Living                      Prior Function            PT Goals (current goals can now be found in the care plan section) Acute Rehab PT Goals PT Goal Formulation: With patient Time For Goal Achievement: 09/04/16 Potential to Achieve Goals: Fair Progress towards PT goals: Progressing toward goals    Frequency    Min 2X/week      PT Plan Current plan remains appropriate  Co-evaluation              AM-PAC PT "6 Clicks" Daily Activity  Outcome Measure  Difficulty turning over in bed (including adjusting bedclothes, sheets and blankets)?: A Lot Difficulty moving from lying on back to sitting on the side of the bed? : A Lot Difficulty sitting down on and standing up from a chair with arms (e.g., wheelchair, bedside commode, etc,.)?: Total Help needed moving to and from a bed to chair (including a wheelchair)?: A Little Help needed walking in hospital room?: A Little Help needed climbing 3-5 steps with a railing? : A Lot 6 Click Score: 13    End of Session Equipment Utilized During Treatment: Gait belt Activity Tolerance: Patient tolerated treatment well;Patient limited by fatigue Patient left: in chair;with call bell/phone within reach;with chair alarm set Nurse  Communication: Mobility status PT Visit Diagnosis: Unsteadiness on feet (R26.81);Other abnormalities of gait and mobility (R26.89);Muscle weakness (generalized) (M62.81);Difficulty in walking, not elsewhere classified (R26.2);Pain Pain - part of body: Ankle and joints of foot     Time: 8676-7209 PT Time Calculation (min) (ACUTE ONLY): 30 min  Charges:  $Gait Training: 8-22 mins $Therapeutic Activity: 8-22 mins                    G Codes:       Earney Navy, PTA Pager: [phone]     Darliss Cheney 08/30/2016, 1:24 PM

## 2016-08-30 NOTE — Discharge Summary (Signed)
Physician Discharge Summary  Ann Goodwin MEQ:[id number] DOB: [birth year] DOA: 08/25/2016  PCP: Benito Mccreedy, MD  Admit date: 08/25/2016 Discharge date: 08/30/2016  Admitted From: home Disposition:  SNF  Recommendations for Outpatient Follow-up:  1. Follow up with PCP in 1-2 weeks 2. Follow up with Dr. Barry Dienes in 2-3 weeks for gallbladder mass  Discharge Condition: stable CODE STATUS: Full code Diet recommendation: regular  HPI: Per Dr. Percival Spanish, This is a 76 y.o.femalewithout known CAD but h/o alcoholism, chronic tobacco use, chronic LE venous stasis presented with c/o left shoulder pain and chest pain to [facility] ED.  She was seen in 2015 by Dr. Acie Fredrickson and had a stress test which was low risk.  She presented with pain in the shoulder.  She is vague about the onset. It might have been going on for a couple of days but was worse yesterday.  It was sharp and shooting.  It seemed to be positional with increased pain on moving the arm.   She very inactive.  She says she is afraid to fall and has gait problems.  She sleeps in a chair (because of bed bugs) but does not have PND or orthopnea.  She does report some DOE.  She does not report chest pressure.  She has no palpitations.  She did get nauseated yesterday.  She does not describe acute diaphoresis.  The called EMS  In the ED her trop was 0.15.   EKG was not acute.  She reports getting NTG in the ED but I don't see this.  She thought this might have helped with pain.  She did get ASA and heparin.    Hospital Course: Discharge Diagnoses:  Principal Problem:   Acute pain of left shoulder Active Problems:   Chest pain   Hypertension   Alcohol dependence (HCC)   Chronic venous stasis dermatitis   Elevated troponin   Dilated aortic root (HCC)   Infestation by bed bug   Tobacco abuse   Chest pain -patient was admitted to the hospital with chest pain, initially on cardiology service. Patient underwent a stress test on 7/10  which showed medium defect of moderate severity, low risk study overall, without any significant reversible ischemia. Continue aspirin. 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 Ongology was consulted and patient was discussed at tumor board. I discussed with Dr. Barry Dienes who will see patient in office following discharge. Patient currently is very deconditioned and plan is to work on rehab in SNF setting. CA-19-9 elevated at 47, and CEA 125 normal at 8.8 Hyponatremia -Discontinue HCTZ, continue Lasix, Na improved Hyperlipidemia -Continue atorvastatin Hypothyroidism -Continue Synthroid Hypertension -Blood pressure controlled, continue Lasix Alcohol use -Does not appear to be withdrawing currently> encouraged complete cessation. She quit ETOH about 2 weeks ago.   Discharge Instructions  Discharge Instructions    Diet - low sodium heart healthy    Complete by:  As directed    Increase activity slowly    Complete by:  As directed    We strongly recommend you discuss your bed bug situation with an exterminator.     Allergies as of 08/30/2016   No Known Allergies     Medication List    STOP taking these medications   hydrochlorothiazide 12.5 MG tablet Commonly known as:  HYDRODIURIL  TAKE these medications   atorvastatin 10 MG tablet Commonly known as:  LIPITOR Take 10 mg by mouth at bedtime.   furosemide 20 MG tablet Commonly known as:  LASIX Take 20 mg by mouth daily.   levothyroxine 50 MCG tablet Commonly known as:  SYNTHROID, LEVOTHROID Take 50 mcg by mouth daily before breakfast.   potassium chloride SA 20 MEQ tablet Commonly known as:  K-DUR,KLOR-CON Take 1 tablet (20 mEq total) by mouth daily.   Vitamin D3 5000 units Caps Take 5,000 Units by mouth daily.         Contact information for follow-up providers    Benito Mccreedy, MD Follow up.   Specialty:  Internal Medicine Why:  Would recommend follow-up within 1 week to recheck your potassium Contact information: [place] [address] [phone]        Burtis Junes, NP Follow up.   Specialties:  Nurse Practitioner, Interventional Cardiology, Cardiology, Radiology Why:  [facility] - 09/26/16 at 2:30pm. Arrive 15 minutes early to check in. Cecille Rubin is one of the nurse practitioners that works with the cardiology team. Contact information: [place] [place] [address] [phone]        Byerly, Faera, MD Follow up in 3 week(s).   Specialty:  General Surgery Why:  about gallbladder mass Contact information: [place] [address] [phone]            Contact information for after-discharge care    [facility] Follow up.   Specialty:  [place] information: [address] [place] [phone]                 No Known Allergies  Consultations:  Cardiology   Oncology  General surgery   Procedures/Studies: 2D echo Impressions: - Normal LV systolic function; mild diastolic dysfunction; mild LVH; mildly dilated aortic root.   Ct Abdomen Pelvis W Wo Contrast  Result Date: 08/28/2016 CLINICAL DATA:  Right lower abdominal pain with nausea since last night. Prior hysterectomy. Inpatient. EXAM: CT ABDOMEN AND PELVIS WITHOUT AND WITH CONTRAST TECHNIQUE: Multidetector CT imaging of the abdomen and pelvis was performed following the standard protocol before and following the bolus administration of intravenous contrast. CONTRAST:  128mL ISOVUE-300 IOPAMIDOL (ISOVUE-300) INJECTION 61% COMPARISON:  None. FINDINGS: Lower chest: Mild scarring versus atelectasis in the dependent lung bases bilaterally. Coronary  atherosclerosis. Hepatobiliary: There is an avidly enhancing infiltrative appearing 5.2 x 3.3 x 4.2 cm mass involving much of the gallbladder (series 8/ image 35). No radiopaque cholelithiasis. No definite pericholecystic fluid. Normal liver size. No definite liver surface irregularity. There is poorly marginated heterogeneous attenuation of the subcapsular liver adjacent to the gallbladder. No discrete liver masses. No biliary ductal dilatation. Common bile duct diameter 6 mm, top-normal. No radiopaque choledocholithiasis. Pancreas: Normal, with no mass or duct dilation. Spleen: Normal size. No mass. Adrenals/Urinary Tract: Normal adrenals. No renal stones. No hydronephrosis. Symmetric normal contrast nephrograms. Normal size kidneys. No renal masses. Normal bladder. Stomach/Bowel: Small hiatal hernia. Otherwise collapsed and grossly normal stomach. Normal caliber small bowel with no small bowel wall thickening. Candidate normal diminutive appendix. No pericecal inflammatory changes. Scattered mild to moderate colonic diverticulosis, with no large bowel wall thickening or pericolonic fat stranding. Oral contrast reaches the transverse colon. Vascular/Lymphatic: Atherosclerotic nonaneurysmal abdominal aorta. Patent portal, splenic, hepatic and renal veins. No pathologically enlarged lymph nodes in the abdomen  or pelvis. Reproductive: Status posthysterectomy. Cystic 7.8 x 5.0 cm right adnexal mass (series 8/ image 71) with a mildly thickened eccentric internal septation and no appreciable solid mural nodularity. No left adnexal mass. Other: No pneumoperitoneum, ascites or focal fluid collection. Musculoskeletal: No aggressive appearing focal osseous lesions. Moderate to marked thoracolumbar spondylosis. IMPRESSION: 1. Avidly enhancing infiltrative appearing 5.2 x 3.3 x 4.2 cm gallbladder mass replacing much of the gallbladder, worrisome for primary gallbladder carcinoma. Surgical consultation advised. 2. Nonspecific  ill-defined heterogeneity in the subcapsular liver adjacent to the gallbladder, for which direct invasion by gallbladder tumor cannot be excluded. No additional potential findings of metastatic disease in the abdomen or pelvis. 3. Cystic 7.8 cm right adnexal mass with mildly thickened eccentric internal septation. Differential for this finding includes right hydrosalpinx versus cystic right ovarian neoplasm. Consider GYN consultation and further characterization with transabdominal and transvaginal pelvic ultrasound . 4. No evidence of bowel obstruction or acute bowel inflammation. Diffuse colonic diverticulosis, with no evidence of acute diverticulitis. 5. Small hiatal hernia. 6. Aortic atherosclerosis.  Coronary atherosclerosis. Electronically Signed   By: Ilona Sorrel M.D.   On: 08/28/2016 15:49   Dg Chest 2 View  Result Date: 08/25/2016 CLINICAL DATA:  Chest pain.  Weakness. EXAM: CHEST  2 VIEW COMPARISON:  Radiograph 11/02/2015, additional priors FINDINGS: The cardiomediastinal contours are unchanged with borderline mild cardiomegaly and atherosclerosis of the aortic arch. Probable emphysema. Linear atelectasis or scarring at the left lung base. Pulmonary vasculature is normal. No consolidation, pleural effusion, or pneumothorax. No acute osseous abnormalities are seen. IMPRESSION: 1. No acute abnormality. 2. Stable mild cardiomegaly and thoracic aortic atherosclerosis. Probable emphysema. Electronically Signed   By: Jeb Levering M.D.   On: 08/25/2016 21:14   Nm Myocar Multi W/spect W/wall Motion / Ef  Result Date: 08/28/2016  There was no ST segment deviation noted during stress.  Defect 1: There is a medium defect of moderate severity.  This is a low risk study.  Nuclear stress EF: 59%.  No T wave inversion was noted during stress.  Moderate size and intensity fixed inferoseptal perfusion defect, suspect attenuation artifact. No significant reversible ischemia. LVEF 59% with normal wall  motion. This is a low risk study.   Mr Abdomen Mrcp Moise Boring Contast  Result Date: 08/30/2016 CLINICAL DATA:  Inpatient. Patient admitted with chest and abdominal pain. Gallbladder mass suspicious for gallbladder carcinoma on CT study. EXAM: MRI ABDOMEN WITHOUT AND WITH CONTRAST (INCLUDING MRCP) TECHNIQUE: Multiplanar multisequence MR imaging of the abdomen was performed both before and after the administration of intravenous contrast. Heavily T2-weighted images of the biliary and pancreatic ducts were obtained, and three-dimensional MRCP images were rendered by post processing. CONTRAST:  56mL MULTIHANCE GADOBENATE DIMEGLUMINE 529 MG/ML IV SOLN COMPARISON:  08/28/2016 CT abdomen/ pelvis. FINDINGS: Significantly motion degraded scan. Lower chest: Mild scarring versus atelectasis at the dependent lung bases bilaterally, right greater than left, unchanged. Mild cardiomegaly. Hepatobiliary: There is slight hypertrophy of the lateral segment left liver lobe with possible slight liver surface irregularity. No hepatic steatosis. There is an irregular 5.8 x 3.9 x 4.7 cm avidly enhancing gallbladder mass centered in the upper body/ fundus (series 15/ image 71) with circumferential involvement of the gallbladder wall. This gallbladder mass appears adherent to the undersurface of the adjacent right liver lobe, with possible minimal direct invasion of the adjacent subcapsular liver up to 3 mm depth (series 5/image 19). Otherwise no liver masses. No cholelithiasis. No pericholecystic fluid. No biliary ductal dilatation. Common  bile duct diameter 6 mm, top-normal. No choledocholithiasis. Pancreas: No pancreatic mass or duct dilation.  No pancreas divisum. Spleen: Normal size. No mass. Adrenals/Urinary Tract: No discrete adrenal nodules. No hydronephrosis. Normal kidneys with no renal mass. Stomach/Bowel: Grossly normal stomach. Visualized small and large bowel is normal caliber, with no bowel wall thickening. Vascular/Lymphatic:  Atherosclerotic nonaneurysmal abdominal aorta. Patent portal, splenic, hepatic and renal veins. No pathologically enlarged lymph nodes in the abdomen. Other: No abdominal ascites or focal fluid collection. Musculoskeletal: No aggressive appearing focal osseous lesions. IMPRESSION: 1. Irregular 5.8 x 3.9 x 4.7 cm avidly enhancing gallbladder mass circumferentially involving the upper body/fundus of the gallbladder, suspicious for primary gallbladder carcinoma. Gallbladder mass appears adherent to the adjacent undersurface of the right liver lobe, cannot exclude slight invasion of the adjacent subcapsular liver up to 3 mm depth . 2. Otherwise no findings of metastatic disease in the abdomen. 3. No cholelithiasis.  No biliary ductal dilatation. 4. Possible early morphologic changes of cirrhosis. No secondary findings of portal hypertension. 5.  Aortic Atherosclerosis (ICD10-I70.0). Electronically Signed   By: Ilona Sorrel M.D.   On: 08/30/2016 07:41    Subjective: - no chest pain, shortness of breath, no abdominal pain, nausea or vomiting.   Discharge Exam: Vitals:   08/30/16 1116 08/30/16 1214  BP: 119/74 116/61  Pulse: 86 74  Resp:  20  Temp:  98.5 F (36.9 C)   Vitals:   08/30/16 0245 08/30/16 0544 08/30/16 1116 08/30/16 1214  BP:  118/67 119/74 116/61  Pulse:  73 86 74  Resp:  20  20  Temp:  98.4 F (36.9 C)  98.5 F (36.9 C)  TempSrc:  Oral  Oral  SpO2:  97% 98% 100%  Weight: 66.5 kg (146 lb 8 oz)     Height:        General: Pt is alert, awake, not in acute distress Cardiovascular: RRR, S1/S2 +, no rubs, no gallops Respiratory: CTA bilaterally, no wheezing, no rhonchi Abdominal: Soft, NT, ND, bowel sounds + Extremities: no edema, no cyanosis    The results of significant diagnostics from this hospitalization (including imaging, microbiology, ancillary and laboratory) are listed below for reference.     Microbiology: No results found for this or any previous visit (from the  past 240 hour(s)).   Labs: BNP (last 3 results)  Recent Labs  08/26/16 0810  BNP 56.4   Basic Metabolic Panel:  Recent Labs Lab 08/25/16 1957 08/26/16 0811 08/29/16 0404 08/30/16 0510  NA 138 138 130* 131*  K 3.4* 3.7 3.3* 3.5  CL 104 104 91* 93*  CO2 26 25 27 27   GLUCOSE 116* 107* 111* 98  BUN 5* 5* 11 14  CREATININE 0.57 0.51 0.72 0.73  CALCIUM 9.1 8.8* 9.0 8.7*   Liver Function Tests:  Recent Labs Lab 08/25/16 2042 08/26/16 0811 08/30/16 0510  AST 17 15 30   ALT 8* 7* 16  ALKPHOS 94 93 112  BILITOT 0.5 1.0 1.4*  PROT 8.5* 8.2* 7.7  ALBUMIN 3.8 3.6 2.6*    Recent Labs Lab 08/25/16 2042  LIPASE 14   No results for input(s): AMMONIA in the last 168 hours. CBC:  Recent Labs Lab 08/25/16 1957 08/26/16 0811 08/27/16 0458 08/28/16 0458 08/30/16 0510  WBC 11.7* 8.5 9.6 10.0 8.8  NEUTROABS 9.2*  --   --   --   --   HGB 14.0 13.8 13.5 13.5 13.7  HCT 40.1 40.0 40.5 40.9 40.6  MCV 92.8 93.2 94.2  94.0 93.3  PLT 253 244 253 253 296   Cardiac Enzymes:  Recent Labs Lab 08/25/16 2042 08/26/16 0810 08/26/16 1341 08/26/16 1904 08/27/16 0458  TROPONINI 0.15* 0.15* 0.10* 0.10* 0.19*   BNP: Invalid input(s): POCBNP CBG:  Recent Labs Lab 08/27/16 0704 08/28/16 0616 08/28/16 0749 08/29/16 0524 08/30/16 0518  GLUCAP 124* 93 105* 115* 107*   D-Dimer No results for input(s): DDIMER in the last 72 hours. Hgb A1c No results for input(s): HGBA1C in the last 72 hours. Lipid Profile No results for input(s): CHOL, HDL, LDLCALC, TRIG, CHOLHDL, LDLDIRECT in the last 72 hours. Thyroid function studies  Recent Labs  08/29/16 1201  TSH 5.424*   Anemia work up No results for input(s): VITAMINB12, FOLATE, FERRITIN, TIBC, IRON, RETICCTPCT in the last 72 hours. Urinalysis    Component Value Date/Time   COLORURINE STRAW (A) 08/27/2016 1057   APPEARANCEUR CLEAR 08/27/2016 1057   LABSPEC 1.003 (L) 08/27/2016 1057   PHURINE 7.0 08/27/2016 1057    GLUCOSEU NEGATIVE 08/27/2016 1057   HGBUR SMALL (A) 08/27/2016 1057   BILIRUBINUR NEGATIVE 08/27/2016 1057   KETONESUR NEGATIVE 08/27/2016 1057   PROTEINUR NEGATIVE 08/27/2016 1057   UROBILINOGEN 1.0 09/21/2012 2140   NITRITE NEGATIVE 08/27/2016 1057   LEUKOCYTESUR NEGATIVE 08/27/2016 1057   Sepsis Labs Invalid input(s): PROCALCITONIN,  WBC,  LACTICIDVEN Microbiology No results found for this or any previous visit (from the past 240 hour(s)).   Time coordinating discharge: 35 minutes  SIGNED:  Marzetta Board, MD  [facility] 08/30/2016, 3:26 PM Pager [phone]  If 7PM-7AM, please contact night-coverage [email or web address] Password [email or web address]

## 2016-08-30 NOTE — Clinical Social Work Note (Signed)
Clinical Social Work Assessment  Patient Details  Name: Ann Goodwin MRN: [id number] Date of Birth: 05-23-1940  Date of referral:  08/30/16               Reason for consult:  Facility Placement, Discharge Planning                Permission sought to share information with:  Chartered certified accountant granted to share information::  Yes, Verbal Permission Granted  Name::        Agency::  SNF's  Relationship::     Contact Information:     Housing/Transportation Living arrangements for the past 2 months:  Apartment Source of Information:  Patient, Medical Team Patient Interpreter Needed:  None Criminal Activity/Legal Involvement Pertinent to Current Situation/Hospitalization:  No - Comment as needed Significant Relationships:  Adult Children, Friend Lives with:  Adult Children Do you feel safe going back to the place where you live?  Yes Need for family participation in patient care:  Yes (Comment)  Care giving concerns:  PT recommending SNF once medically stable for discharge.   Social Worker assessment / plan:  CSW met with patient. No supports at bedside. CSW introduced role and explained that PT recommendations would be discussed. Patient agreeable to SNF placement. Patient confirmed bed bugs at home. Staff state that there have been no bed bugs found here at the hospital. Patient understands that she cannot bring her clothes from home and no one that lives with her can visit her. No further concerns. CSW encouraged patient to contact CSW as needed. CSW will continue to follow patient for support and facilitate discharge to SNF today.  Employment status:  Retired Forensic scientist:  Medicare PT Recommendations:  [place] / Referral to community resources:  [place]  Patient/Family's Response to care:  Patient agreeable to SNF placement. Patient lives with her son. Patient appreciated social work  intervention.  Patient/Family's Understanding of and Emotional Response to Diagnosis, Current Treatment, and Prognosis:  Patient has a good understanding of the reason for admission and her need for rehab prior to returning home. Patient appears pleased with hospital care.  Emotional Assessment Appearance:  Appears stated age Attitude/Demeanor/Rapport:  Other (Pleasant) Affect (typically observed):  Accepting, Appropriate, Calm, Pleasant Orientation:  Oriented to Self, Oriented to Place, Oriented to  Time, Oriented to Situation Alcohol / Substance use:  Tobacco Use, Alcohol Use Psych involvement (Current and /or in the community):  No (Comment)  Discharge Needs  Concerns to be addressed:  Care Coordination Readmission within the last 30 days:  No Current discharge risk:  Dependent with Mobility Barriers to Discharge:  Other (Bed bugs at home)   Candie Chroman, LCSW 08/30/2016, 11:33 AM

## 2016-09-03 ENCOUNTER — Telehealth: Payer: Self-pay | Admitting: *Deleted

## 2016-09-03 NOTE — Telephone Encounter (Signed)
Called and spoke with the son regarding the appt on Wednesday. Gave the sone the appt for  July 18th at 11:30am; arrive at 11am

## 2016-09-05 ENCOUNTER — Telehealth: Payer: Self-pay | Admitting: *Deleted

## 2016-09-05 ENCOUNTER — Ambulatory Visit: Payer: Medicare Other | Admitting: Gynecologic Oncology

## 2016-09-05 ENCOUNTER — Encounter: Payer: Self-pay | Admitting: *Deleted

## 2016-09-05 NOTE — Telephone Encounter (Signed)
Contacted the patient's son and explained "that we need to cancel the appt today; and move it to after the appt with Dr. Marlowe Aschoff office. Please call Dr. Marlowe Aschoff office and set up the appt and then call our office back. We will see her after that visit." Son verbally stated understanding

## 2016-09-26 ENCOUNTER — Ambulatory Visit: Payer: Medicare Other | Admitting: Nurse Practitioner

## 2016-09-26 NOTE — Progress Notes (Deleted)
CARDIOLOGY OFFICE NOTE  Date:  09/26/2016    Rod Holler Date of Birth: November 07, 1940 Medical Record #[id number]  PCP:  Benito Mccreedy, MD  Cardiologist:  Nahser  No chief complaint on file.   History of Present Illness: Ann Goodwin is a 76 y.o. female who presents today for a post hospital visit. Seen for Dr. Johnsie Cancel.   She does not have known CAD. She has a h/o alcoholism, chronic tobacco use, chronic LE venous stasis.  She was seen in 2015 by Dr. Acie Fredrickson and had a stress test which was low risk.   She presented to the hospital a month ago with c/o left shoulder pain and chest pain to [facility]. Symptoms and history quite vague. Found to have gallbladder and ovarian mass - referred to general surgery.   Troponin mildly elevated. Low risk Myoview noted and no further cardiac work up was felt to be needed due to the CT findings for probably malignancy. Noted bed bugs in her home.   Comes in today. Here with   Past Medical History:  Diagnosis Date  . Alcohol abuse   . Chronic edema   . Dilated aortic root ([place]) 08/28/2016  . Hyperlipidemia   . Hypertension   . Hypothyroid   . Infestation by bed bug   . Tobacco abuse   . Venous stasis     Past Surgical History:  Procedure Laterality Date  . ABDOMINAL HYSTERECTOMY    . BUNIONECTOMY       Medications: No outpatient prescriptions have been marked as taking for the 09/26/16 encounter (Appointment) with Burtis Junes, NP.     Allergies: No Known Allergies  Social History: The patient  reports that she has been smoking Cigarettes.  She has been smoking about 0.50 packs per day. She has never used smokeless tobacco. She reports that she drinks about 1.2 oz of alcohol per week . She reports that she does not use drugs.   Family History: The patient's family history includes Stroke in her mother.   Review of Systems: Please see the history of present illness.   Otherwise, the review of systems is  positive for none.   All other systems are reviewed and negative.   Physical Exam: VS:  There were no vitals taken for this visit. Marland Kitchen  BMI There is no height or weight on file to calculate BMI.  Wt Readings from Last 3 Encounters:  08/30/16 146 lb 8 oz (66.5 kg)  11/05/15 150 lb 9.2 oz (68.3 kg)  10/15/15 130 lb (59 kg)    General: Pleasant. Well developed, well nourished and in no acute distress.   HEENT: Normal.  Neck: Supple, no JVD, carotid bruits, or masses noted.  Cardiac: Regular rate and rhythm. No murmurs, rubs, or gallops. No edema.  Respiratory:  Lungs are clear to auscultation bilaterally with normal work of breathing.  GI: Soft and nontender.  MS: No deformity or atrophy. Gait and ROM intact.  Skin: Warm and dry. Color is normal.  Neuro:  Strength and sensation are intact and no gross focal deficits noted.  Psych: Alert, appropriate and with normal affect.   LABORATORY DATA:  EKG:  EKG is not ordered today.  Lab Results  Component Value Date   WBC 8.8 08/30/2016   HGB 13.7 08/30/2016   HCT 40.6 08/30/2016   PLT 296 08/30/2016   GLUCOSE 98 08/30/2016   CHOL 152 08/26/2016   TRIG 80 08/26/2016   HDL 64 08/26/2016  LDLCALC 72 08/26/2016   ALT 16 08/30/2016   AST 30 08/30/2016   NA 131 (L) 08/30/2016   K 3.5 08/30/2016   CL 93 (L) 08/30/2016   CREATININE 0.73 08/30/2016   BUN 14 08/30/2016   CO2 27 08/30/2016   TSH 5.424 (H) 08/29/2016   INR 1.08 08/26/2016   HGBA1C 5.8 (H) 06/01/2013     BNP (last 3 results)  Recent Labs  08/26/16 0810  BNP 84.6    ProBNP (last 3 results) No results for input(s): PROBNP in the last 8760 hours.   Other Studies Reviewed Today:  MR ABDOMEN IMPRESSION 08/2016: 1. Irregular 5.8 x 3.9 x 4.7 cm avidly enhancing gallbladder mass circumferentially involving the upper body/fundus of the gallbladder, suspicious for primary gallbladder carcinoma. Gallbladder mass appears adherent to the adjacent undersurface of the  right liver lobe, cannot exclude slight invasion of the adjacent subcapsular liver up to 3 mm depth . 2. Otherwise no findings of metastatic disease in the abdomen. 3. No cholelithiasis.  No biliary ductal dilatation. 4. Possible early morphologic changes of cirrhosis. No secondary findings of portal hypertension. 5.  Aortic Atherosclerosis (ICD10-I70.0).   Electronically Signed   By: Ilona Sorrel M.D.   On: 08/30/2016 07:41  CT ABDOMEN/PELVIS IMPRESSION: 1. Avidly enhancing infiltrative appearing 5.2 x 3.3 x 4.2 cm gallbladder mass replacing much of the gallbladder, worrisome for primary gallbladder carcinoma. Surgical consultation advised. 2. Nonspecific ill-defined heterogeneity in the subcapsular liver adjacent to the gallbladder, for which direct invasion by gallbladder tumor cannot be excluded. No additional potential findings of metastatic disease in the abdomen or pelvis. 3. Cystic 7.8 cm right adnexal mass with mildly thickened eccentric internal septation. Differential for this finding includes right hydrosalpinx versus cystic right ovarian neoplasm. Consider GYN consultation and further characterization with transabdominal and transvaginal pelvic ultrasound . 4. No evidence of bowel obstruction or acute bowel inflammation. Diffuse colonic diverticulosis, with no evidence of acute diverticulitis. 5. Small hiatal hernia. 6. Aortic atherosclerosis.  Coronary atherosclerosis.   Electronically Signed   By: Ilona Sorrel M.D.   On: 08/28/2016 15:49   Myoview Study Result 08/2016    There was no ST segment deviation noted during stress.  Defect 1: There is a medium defect of moderate severity.  This is a low risk study.  Nuclear stress EF: 59%.  No T wave inversion was noted during stress.   Moderate size and intensity fixed inferoseptal perfusion defect, suspect attenuation artifact. No significant reversible ischemia. LVEF 59% with normal wall motion.  This is a low risk study.       Assessment/Plan:  Chest pain - patient was admitted to the hospital with chest pain, initially on cardiology service. Patient underwent a stress test on 7/10 which showed medium defect of moderate severity, low risk study overall, without any significant reversible ischemia. No further testing was felt to be needed due to other medical conditions noted during this admission.   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 She was to see general surgery.   Hyperlipidemia  - on statin - I would not continue  Hypothyroidism -Continue Synthroid  Hypertension -Blood pressure controlled, continue Lasix  Alcohol abuse -  Current medicines are reviewed with the patient today.  The patient does not have concerns regarding medicines other than what has been noted above.  The following changes have been made:  See above.  Labs/ tests ordered today include:   No orders of the defined types were placed in  this encounter.    Disposition:   FU prn.   Patient is agreeable to this plan and will call if any problems develop in the interim.   SignedTruitt Merle, NP  09/26/2016 12:44 PM  [facility] [address] [place], [place]  [address] Phone: [phone] Fax: [phone]

## 2016-09-27 ENCOUNTER — Telehealth: Payer: Self-pay | Admitting: *Deleted

## 2016-09-27 NOTE — Telephone Encounter (Signed)
Attempted to contact the family regarding the patient, left message on machine to call the office back.

## 2016-09-28 ENCOUNTER — Encounter: Payer: Self-pay | Admitting: Nurse Practitioner

## 2016-10-02 ENCOUNTER — Telehealth: Payer: Self-pay | Admitting: *Deleted

## 2016-10-02 NOTE — Telephone Encounter (Signed)
Called and left a message for a family member to call eht office back regarding the patient. Patient to be scheduled to se DR. Byerly and Dr. Denman George. Appts not scheduled

## 2016-10-05 ENCOUNTER — Telehealth: Payer: Self-pay | Admitting: *Deleted

## 2016-10-05 NOTE — Telephone Encounter (Signed)
Attempted to contact the son at [phone], with no answer. Phone gave message stating that the voicemail is not correct. Left message on the machine at  [phone] to call the office back.

## 2018-03-20 ENCOUNTER — Encounter (HOSPITAL_COMMUNITY): Payer: Self-pay | Admitting: Emergency Medicine

## 2018-03-20 ENCOUNTER — Emergency Department (HOSPITAL_COMMUNITY): Payer: Medicare Other

## 2018-03-20 ENCOUNTER — Inpatient Hospital Stay (HOSPITAL_COMMUNITY)
Admission: EM | Admit: 2018-03-20 | Discharge: 2018-03-26 | DRG: 436 | Disposition: A | Discharge status: Home Health | Payer: Medicare Other | Attending: Family Medicine | Admitting: Family Medicine

## 2018-03-20 DIAGNOSIS — S2231XA Fracture of one rib, right side, initial encounter for closed fracture: Secondary | ICD-10-CM

## 2018-03-20 DIAGNOSIS — W19XXXA Unspecified fall, initial encounter: Secondary | ICD-10-CM

## 2018-03-20 DIAGNOSIS — S27329A Contusion of lung, unspecified, initial encounter: Secondary | ICD-10-CM | POA: Diagnosis present

## 2018-03-20 DIAGNOSIS — D630 Anemia in neoplastic disease: Secondary | ICD-10-CM | POA: Diagnosis present

## 2018-03-20 DIAGNOSIS — C23 Malignant neoplasm of gallbladder: Secondary | ICD-10-CM | POA: Diagnosis not present

## 2018-03-20 DIAGNOSIS — R64 Cachexia: Secondary | ICD-10-CM | POA: Diagnosis present

## 2018-03-20 DIAGNOSIS — Y92009 Unspecified place in unspecified non-institutional (private) residence as the place of occurrence of the external cause: Secondary | ICD-10-CM

## 2018-03-20 DIAGNOSIS — Z6823 Body mass index (BMI) 23.0-23.9, adult: Secondary | ICD-10-CM

## 2018-03-20 DIAGNOSIS — R296 Repeated falls: Secondary | ICD-10-CM | POA: Diagnosis present

## 2018-03-20 DIAGNOSIS — W010XXA Fall on same level from slipping, tripping and stumbling without subsequent striking against object, initial encounter: Secondary | ICD-10-CM | POA: Diagnosis present

## 2018-03-20 DIAGNOSIS — Z79899 Other long term (current) drug therapy: Secondary | ICD-10-CM

## 2018-03-20 DIAGNOSIS — F17210 Nicotine dependence, cigarettes, uncomplicated: Secondary | ICD-10-CM | POA: Diagnosis present

## 2018-03-20 DIAGNOSIS — E785 Hyperlipidemia, unspecified: Secondary | ICD-10-CM | POA: Diagnosis present

## 2018-03-20 DIAGNOSIS — F1010 Alcohol abuse, uncomplicated: Secondary | ICD-10-CM | POA: Diagnosis present

## 2018-03-20 DIAGNOSIS — S27321A Contusion of lung, unilateral, initial encounter: Secondary | ICD-10-CM

## 2018-03-20 DIAGNOSIS — S2239XA Fracture of one rib, unspecified side, initial encounter for closed fracture: Secondary | ICD-10-CM

## 2018-03-20 DIAGNOSIS — C799 Secondary malignant neoplasm of unspecified site: Secondary | ICD-10-CM

## 2018-03-20 DIAGNOSIS — E039 Hypothyroidism, unspecified: Secondary | ICD-10-CM | POA: Diagnosis present

## 2018-03-20 DIAGNOSIS — Z9119 Patient's noncompliance with other medical treatment and regimen: Secondary | ICD-10-CM

## 2018-03-20 DIAGNOSIS — I878 Other specified disorders of veins: Secondary | ICD-10-CM | POA: Diagnosis present

## 2018-03-20 DIAGNOSIS — E46 Unspecified protein-calorie malnutrition: Secondary | ICD-10-CM | POA: Diagnosis present

## 2018-03-20 DIAGNOSIS — Z823 Family history of stroke: Secondary | ICD-10-CM

## 2018-03-20 DIAGNOSIS — R0602 Shortness of breath: Secondary | ICD-10-CM

## 2018-03-20 DIAGNOSIS — D649 Anemia, unspecified: Secondary | ICD-10-CM | POA: Diagnosis present

## 2018-03-20 DIAGNOSIS — C787 Secondary malignant neoplasm of liver and intrahepatic bile duct: Secondary | ICD-10-CM | POA: Diagnosis present

## 2018-03-20 DIAGNOSIS — Z90710 Acquired absence of both cervix and uterus: Secondary | ICD-10-CM

## 2018-03-20 DIAGNOSIS — I10 Essential (primary) hypertension: Secondary | ICD-10-CM | POA: Diagnosis present

## 2018-03-20 LAB — COMPREHENSIVE METABOLIC PANEL
ALT: 11 U/L (ref 0–44)
ANION GAP: 7 (ref 5–15)
AST: 19 U/L (ref 15–41)
Albumin: 2.9 g/dL — ABNORMAL LOW (ref 3.5–5.0)
Alkaline Phosphatase: 69 U/L (ref 38–126)
BILIRUBIN TOTAL: 0.8 mg/dL (ref 0.3–1.2)
BUN: 8 mg/dL (ref 8–23)
CO2: 27 mmol/L (ref 22–32)
Calcium: 8.5 mg/dL — ABNORMAL LOW (ref 8.9–10.3)
Chloride: 102 mmol/L (ref 98–111)
Creatinine, Ser: 0.59 mg/dL (ref 0.44–1.00)
GFR calc Af Amer: >60 mL/min (ref >60)
Glucose, Bld: 99 mg/dL (ref 70–99)
POTASSIUM: 3.2 mmol/L — AB (ref 3.5–5.1)
Sodium: 136 mmol/L (ref 135–145)
TOTAL PROTEIN: 8.5 g/dL — AB (ref 6.5–8.1)

## 2018-03-20 LAB — CBC WITH DIFFERENTIAL/PLATELET
Abs Immature Granulocytes: 0.06 10*3/uL (ref 0.00–0.07)
Basophils Absolute: 0 10*3/uL (ref 0.0–0.1)
Basophils Relative: 0 %
EOS ABS: 0.2 10*3/uL (ref 0.0–0.5)
EOS PCT: 2 %
HCT: 34.7 % — ABNORMAL LOW (ref 36.0–46.0)
Hemoglobin: 11.1 g/dL — ABNORMAL LOW (ref 12.0–15.0)
Immature Granulocytes: 1 %
Lymphocytes Relative: 19 %
Lymphs Abs: 1.7 10*3/uL (ref 0.7–4.0)
MCH: 31 pg (ref 26.0–34.0)
MCHC: 32 g/dL (ref 30.0–36.0)
MCV: 96.9 fL (ref 80.0–100.0)
MONOS PCT: 13 %
Monocytes Absolute: 1.2 10*3/uL — ABNORMAL HIGH (ref 0.1–1.0)
NRBC: 0 % (ref 0.0–0.2)
Neutro Abs: 6 10*3/uL (ref 1.7–7.7)
Neutrophils Relative %: 65 %
Platelets: 268 10*3/uL (ref 150–400)
RBC: 3.58 MIL/uL — ABNORMAL LOW (ref 3.87–5.11)
RDW: 13.6 % (ref 11.5–15.5)
WBC: 9.2 10*3/uL (ref 4.0–10.5)

## 2018-03-20 LAB — URINALYSIS, ROUTINE W REFLEX MICROSCOPIC
Bilirubin Urine: NEGATIVE
Glucose, UA: NEGATIVE mg/dL
Hgb urine dipstick: NEGATIVE
KETONES UR: NEGATIVE mg/dL
LEUKOCYTES UA: NEGATIVE
Nitrite: NEGATIVE
PROTEIN: NEGATIVE mg/dL
Specific Gravity, Urine: 1.002 — ABNORMAL LOW (ref 1.005–1.030)
pH: 7 (ref 5.0–8.0)

## 2018-03-20 MED ORDER — LIDOCAINE 5 % EX PTCH
1.0000 | MEDICATED_PATCH | CUTANEOUS | Status: DC
  Administered 2018-03-20 – 2018-03-25 (×5): 1 via TRANSDERMAL
  Filled 2018-03-20 (×7): qty 1

## 2018-03-20 MED ORDER — POTASSIUM CHLORIDE CRYS ER 20 MEQ PO TBCR
40.0000 meq | EXTENDED_RELEASE_TABLET | Freq: Once | ORAL | Status: AC
  Administered 2018-03-20: 40 meq via ORAL
  Filled 2018-03-20: qty 2

## 2018-03-20 MED ORDER — SODIUM CHLORIDE (PF) 0.9 % IJ SOLN
INTRAMUSCULAR | Status: AC
  Filled 2018-03-20: qty 50

## 2018-03-20 MED ORDER — IOPAMIDOL (ISOVUE-300) INJECTION 61%
100.0000 mL | Freq: Once | INTRAVENOUS | Status: AC | PRN
  Administered 2018-03-20: 100 mL via INTRAVENOUS

## 2018-03-20 MED ORDER — ACETAMINOPHEN 500 MG PO TABS
1000.0000 mg | ORAL_TABLET | Freq: Once | ORAL | Status: AC
Start: 2018-03-20 — End: 2018-03-20
  Administered 2018-03-20: 1000 mg via ORAL
  Filled 2018-03-20: qty 2

## 2018-03-20 MED ORDER — IOPAMIDOL (ISOVUE-300) INJECTION 61%
INTRAVENOUS | Status: AC
  Filled 2018-03-20: qty 100

## 2018-03-20 NOTE — ED Provider Notes (Signed)
[facility] DEPT Provider Note   CSN: [id number] Arrival date & time: 03/20/18  1422     History   Chief Complaint Chief Complaint  Patient presents with  . Fall    HPI Lashonne Shull Fentress is a 78 y.o. female.  The history is provided by the patient and medical records. No language interpreter was used.  Fall    Quinlee Sciarra Rawlinson is a 78 y.o. female  with a PMH of hypertension, hyperlipidemia, alcohol abuse who presents to the Emergency Department complaining of right side pain after a fall just prior to arrival.  Patient states that she was at her door when she fell to her side and hit her right flank against the arm of a chair.  She did not fall to the floor, but fell on top of this chair.  She did not strike her head.  There was no loss of consciousness.  She is not on anticoagulant bleeding.  She denies any numbness, tingling or weakness.  She does not state that she tripped over anything and is unsure what exactly caused her fall.  When asked if she felt dizzy, she states "maybe", but states that she is not dizzy at all now.  She just felt like she was a little off balance and fell over.  She denies any trouble breathing or having any chest pain, although she does report that her rib cage area hurts worse with deep breaths or any movement.  No medication taken prior to arrival for her symptoms.  Past Medical History:  Diagnosis Date  . Alcohol abuse   . Chronic edema   . Dilated aortic root ([place]) 08/28/2016  . Hyperlipidemia   . Hypertension   . Hypothyroid   . Infestation by bed bug   . Tobacco abuse   . Venous stasis     Patient Active Problem List   Diagnosis Date Noted  . Right pulmonary contusion 03/20/2018  . Dilated aortic root ([place]) 08/28/2016  . Infestation by bed bug 08/28/2016  . Tobacco abuse 08/28/2016  . Acute pain of left shoulder   . Elevated troponin   . Cellulitis 11/02/2015  . Hypertension 11/02/2015  . Alcohol  dependence ([place]) 11/02/2015  . Hypokalemia 11/02/2015  . 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 Chest pain 06/01/2013    Past Surgical History:  Procedure Laterality Date  . ABDOMINAL HYSTERECTOMY    . BUNIONECTOMY       OB History   No obstetric history on file.      Home Medications    Prior to Admission medications   Medication Sig Start Date End Date Taking? Authorizing Provider  acetaminophen (TYLENOL) 500 MG tablet Take 500 mg by mouth every 6 (six) hours as needed for moderate pain.   Yes [provider]  potassium chloride SA (K-DUR,KLOR-CON) 20 MEQ tablet Take 1 tablet (20 mEq total) by mouth daily. Patient not taking: Reported on 03/20/2018 08/28/16   Charlie Pitter, PA-C    Family History Family History  Problem Relation Age of Onset  . Stroke Mother   . Deep vein thrombosis Neg Hx   . Pulmonary embolism Neg Hx     Social History Social History   Tobacco Use  . Smoking status: Current Every Day Smoker    Packs/day: 0.50    Types: Cigarettes  . Smokeless tobacco: Never Used  Substance Use Topics  . Alcohol use: Yes    Alcohol/week: 2.0 standard drinks  Types: 2 Cans of beer per week    Comment: 2 or 3 40 oz every day  . Drug use: No     Allergies   Patient has no known allergies.   Review of Systems Review of Systems  Musculoskeletal: Positive for arthralgias and myalgias.  Neurological: Positive for dizziness (?).  All other systems reviewed and are negative.    Physical Exam Updated Vital Signs BP (!) 158/79   Pulse 83   Temp 98.7 F (37.1 C) (Oral)   Resp 16   SpO2 99%   Physical Exam Vitals signs and nursing note reviewed.  Constitutional:      General: She is not in acute distress.    Appearance: She is well-developed.  HENT:     Head: Normocephalic and atraumatic.  Neck:     Musculoskeletal: Neck supple.  Cardiovascular:     Rate and Rhythm: Normal rate and regular rhythm.     Heart sounds: Normal  heart sounds. No murmur.  Pulmonary:     Effort: Pulmonary effort is normal. No respiratory distress.     Breath sounds: Normal breath sounds.     Comments: Tenderness to palpation to the right lateral rib cage region without overlying skin changes.  No crepitus. Abdominal:     General: There is no distension.     Palpations: Abdomen is soft.     Tenderness: There is no abdominal tenderness.  Skin:    General: Skin is warm and dry.  Neurological:     Mental Status: She is alert and oriented to person, place, and time.     Comments: Speech without dysarthria and goal oriented. CN 2-12 grossly intact. No drift. Strength and sensation intact.      ED Treatments / Results  Labs (all labs ordered are listed, but only abnormal results are displayed) Labs Reviewed  CBC WITH DIFFERENTIAL/PLATELET - Abnormal; Notable for the following components:      Result Value   RBC 3.58 (*)    Hemoglobin 11.1 (*)    HCT 34.7 (*)    Monocytes Absolute 1.2 (*)    All other components within normal limits  COMPREHENSIVE METABOLIC PANEL - Abnormal; Notable for the following components:   Potassium 3.2 (*)    Calcium 8.5 (*)    Total Protein 8.5 (*)    Albumin 2.9 (*)    All other components within normal limits  URINALYSIS, ROUTINE W REFLEX MICROSCOPIC - Abnormal; Notable for the following components:   Color, Urine STRAW (*)    Specific Gravity, Urine 1.002 (*)    All other components within normal limits    EKG None  Radiology Dg Ribs Unilateral W/chest Right  Result Date: 03/20/2018 CLINICAL DATA:  Acute chest and RIGHT rib pain following fall today. EXAM: RIGHT RIBS AND CHEST - 3+ VIEW COMPARISON:  08/25/2016 and prior radiographs FINDINGS: UPPER limits normal heart size and mild LEFT basilar scarring again noted. There is no evidence of focal airspace disease, pulmonary edema, suspicious pulmonary nodule/mass, pleural effusion, or pneumothorax. A nondisplaced fracture of the anterior RIGHT  10th rib is present. IMPRESSION: Nondisplaced anterior RIGHT 10th rib fracture. No pleural effusion or pneumothorax. Electronically Signed   By: Margarette Canada M.D.   On: 03/20/2018 15:39   Ct Chest W Contrast  Result Date: 03/20/2018 CLINICAL DATA:  Right-sided rib pain after mechanical fall. EXAM: CT CHEST, ABDOMEN, AND PELVIS WITH CONTRAST TECHNIQUE: Multidetector CT imaging of the chest, abdomen and pelvis was performed following the  standard protocol during bolus administration of intravenous contrast. CONTRAST:  140mL ISOVUE-300 IOPAMIDOL (ISOVUE-300) INJECTION 61% COMPARISON:  CT abdomen and pelvis 08/28/2016. MRI abdomen 08/30/2016. FINDINGS: CT CHEST FINDINGS Cardiovascular: Normal caliber thoracic aorta. Scattered aortic calcifications. Great vessel origins are patent. Mild cardiac enlargement. No significant pericardial effusion. Coronary artery calcifications. Mediastinum/Nodes: Esophagus is decompressed. No significant lymphadenopathy in the chest. Lungs/Pleura: Scattered emphysematous changes in the lungs. Mild dependent atelectasis. Focal area consolidation in the right lung base could represent pneumonia possibly aspiration or contusion. No pleural effusions. No pneumothorax. Musculoskeletal: Degenerative changes in the spine. No vertebral compression deformities. Normal alignment. Sternum appears intact. Minimally displaced fracture of the right anterolateral tenth rib. CT ABDOMEN PELVIS FINDINGS Hepatobiliary: Large poorly defined hypoenhancing mass replacing the gallbladder and with infiltration into the adjacent liver involving segments 4 and 5. There is infiltration in the adjacent fat suggesting local spread. 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 Fat planes with the adjacent duodenum and splenic flexure of the colon or non distinct suggesting possible direct invasion. No bile duct  dilatation. Pancreas: Unremarkable. No pancreatic ductal dilatation or surrounding inflammatory changes. Spleen: Normal in size without focal abnormality. Adrenals/Urinary Tract: Adrenal glands are unremarkable. Kidneys are normal, without renal calculi, focal lesion, or hydronephrosis. Bladder is unremarkable. Stomach/Bowel: Stomach, small bowel, and colon are mostly decompressed. Colonic diverticula without evidence of diverticulitis. Appendix is not identified. Vascular/Lymphatic: Calcification of the aorta. No aneurysm. No significant lymphadenopathy. Reproductive: Surgical absence of the gallbladder. Other: Free fluid in the pelvis may be reactive or malignant. No free air. Abdominal wall musculature appears intact. Musculoskeletal: Degenerative changes in the spine and hips. Schmorl's node at L1. No destructive bone lesions. No acute fractures are identified. IMPRESSION: 1. Minimally displaced fracture of the right anterolateral tenth rib. 2. Focal consolidation in the right lung base could represent pneumonia, contusion, or aspiration. 3. 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 4. Free fluid in the pelvis may be reactive or malignant. 5. Colonic diverticulosis without evidence of diverticulitis. Aortic Atherosclerosis (ICD10-I70.0). Electronically Signed   By: Lucienne Capers M.D.   On: 03/20/2018 22:11   Ct Abdomen Pelvis W Contrast  Result Date: 03/20/2018 CLINICAL DATA:  Right-sided rib pain after mechanical fall. EXAM: CT CHEST, ABDOMEN, AND PELVIS WITH CONTRAST TECHNIQUE: Multidetector CT imaging of the chest, abdomen and pelvis was performed following the standard protocol during bolus administration of intravenous contrast. CONTRAST:  118mL ISOVUE-300 IOPAMIDOL (ISOVUE-300) INJECTION 61% COMPARISON:  CT abdomen and pelvis 08/28/2016. MRI abdomen  08/30/2016. FINDINGS: CT CHEST FINDINGS Cardiovascular: Normal caliber thoracic aorta. Scattered aortic calcifications. Great vessel origins are patent. Mild cardiac enlargement. No significant pericardial effusion. Coronary artery calcifications. Mediastinum/Nodes: Esophagus is decompressed. No significant lymphadenopathy in the chest. Lungs/Pleura: Scattered emphysematous changes in the lungs. Mild dependent atelectasis. Focal area consolidation in the right lung base could represent pneumonia possibly aspiration or contusion. No pleural effusions. No pneumothorax. Musculoskeletal: Degenerative changes in the spine. No vertebral compression deformities. Normal alignment. Sternum appears intact. Minimally displaced fracture of the right anterolateral tenth rib. CT ABDOMEN PELVIS FINDINGS Hepatobiliary: Large poorly defined hypoenhancing mass replacing the gallbladder and with infiltration into the adjacent liver involving segments 4 and 5. There is infiltration in the adjacent fat suggesting local spread. The mass measures about 6.6 x 9.3 cm and represents  significant progression of the patient's gallbladder carcinoma as seen on the previous studies. Fat planes with the adjacent duodenum and splenic flexure of the colon or non distinct suggesting possible direct invasion. No bile duct dilatation. Pancreas: Unremarkable. No pancreatic ductal dilatation or surrounding inflammatory changes. Spleen: Normal in size without focal abnormality. Adrenals/Urinary Tract: Adrenal glands are unremarkable. Kidneys are normal, without renal calculi, focal lesion, or hydronephrosis. Bladder is unremarkable. Stomach/Bowel: Stomach, small bowel, and colon are mostly decompressed. Colonic diverticula without evidence of diverticulitis. Appendix is not identified. Vascular/Lymphatic: Calcification of the aorta. No aneurysm. No significant lymphadenopathy. Reproductive: Surgical absence of the gallbladder. Other: Free fluid in the  pelvis may be reactive or malignant. No free air. Abdominal wall musculature appears intact. Musculoskeletal: Degenerative changes in the spine and hips. Schmorl's node at L1. No destructive bone lesions. No acute fractures are identified. IMPRESSION: 1. Minimally displaced fracture of the right anterolateral tenth rib. 2. Focal consolidation in the right lung base could represent pneumonia, contusion, or aspiration. 3. 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 4. Free fluid in the pelvis may be reactive or malignant. 5. Colonic diverticulosis without evidence of diverticulitis. Aortic Atherosclerosis (ICD10-I70.0). Electronically Signed   By: Lucienne Capers M.D.   On: 03/20/2018 22:11    Procedures Procedures (including critical care time)  Medications Ordered in ED Medications  lidocaine (LIDODERM) 5 % 1 patch (1 patch Transdermal Patch Applied 03/20/18 1812)  iopamidol (ISOVUE-300) 61 % injection (has no administration in time range)  sodium chloride (PF) 0.9 % injection (has no administration in time range)  acetaminophen (TYLENOL) tablet 1,000 mg (1,000 mg Oral Given 03/20/18 1805)  potassium chloride SA (K-DUR,KLOR-CON) CR tablet 40 mEq (40 mEq Oral Given 03/20/18 2004)  iopamidol (ISOVUE-300) 61 % injection 100 mL (100 mLs Intravenous Contrast Given 03/20/18 2131)     Initial Impression / Assessment and Plan / ED Course  I have reviewed the triage vital signs and the nursing notes.  Pertinent labs & imaging results that were available during my care of the patient were reviewed by me and considered in my medical decision making (see chart for details).    GERALDYNE BARRACLOUGH is a 78 y.o. female who presents to ED for evaluation after a fall earlier today.  Imaging showing nondisplaced 10th rib fracture.  She is tender to this area.  She also  has focal consolidation in the right lung base concerning for pulmonary contusion.  I am concerned that she may not be safe ambulating at home given her injury and already poor baseline ability to ambulate.  Her imaging also shows significant progression of her gallbladder carcinoma.  I discussed this with patient, but she states that she actually did not know that she had gallbladder cancer.  Does not appear that she is followed by anyone as an outpatient.  Discussed case with Dr. Redmond Pulling of the trauma service who recommends incentive spirometry, pulmonary toilet and adequate analgesia for pulmonary contusion.  Discussed with hospitalist who will admit.  Patient seen by and discussed with Dr. Ralene Bathe who agrees with treatment plan.    Final Clinical Impressions(s) / ED Diagnoses   Final diagnoses:  Contusion of right lung, initial encounter  Closed fracture of one rib of right side, initial encounter    ED Discharge Orders    None       Zauria Dombek, Ozella Almond, PA-C 03/20/18 2311  Quintella Reichert, MD 03/25/18 1536

## 2018-03-20 NOTE — ED Notes (Signed)
Pt transported to CT ?

## 2018-03-20 NOTE — ED Triage Notes (Signed)
Per EMS-mechanical fall-right side/rib pain-didn't hit head, no LOC

## 2018-03-21 ENCOUNTER — Other Ambulatory Visit: Payer: Self-pay

## 2018-03-21 ENCOUNTER — Inpatient Hospital Stay (HOSPITAL_COMMUNITY): Payer: Medicare Other

## 2018-03-21 ENCOUNTER — Observation Stay (HOSPITAL_COMMUNITY): Payer: Medicare Other

## 2018-03-21 ENCOUNTER — Encounter (HOSPITAL_COMMUNITY): Payer: Self-pay | Admitting: Internal Medicine

## 2018-03-21 DIAGNOSIS — C799 Secondary malignant neoplasm of unspecified site: Secondary | ICD-10-CM | POA: Diagnosis not present

## 2018-03-21 DIAGNOSIS — E039 Hypothyroidism, unspecified: Secondary | ICD-10-CM | POA: Diagnosis present

## 2018-03-21 DIAGNOSIS — S2231XA Fracture of one rib, right side, initial encounter for closed fracture: Secondary | ICD-10-CM | POA: Diagnosis not present

## 2018-03-21 DIAGNOSIS — I878 Other specified disorders of veins: Secondary | ICD-10-CM | POA: Diagnosis present

## 2018-03-21 DIAGNOSIS — R160 Hepatomegaly, not elsewhere classified: Secondary | ICD-10-CM | POA: Diagnosis not present

## 2018-03-21 DIAGNOSIS — K829 Disease of gallbladder, unspecified: Secondary | ICD-10-CM

## 2018-03-21 DIAGNOSIS — C787 Secondary malignant neoplasm of liver and intrahepatic bile duct: Secondary | ICD-10-CM | POA: Diagnosis present

## 2018-03-21 DIAGNOSIS — F1010 Alcohol abuse, uncomplicated: Secondary | ICD-10-CM | POA: Diagnosis present

## 2018-03-21 DIAGNOSIS — W010XXA Fall on same level from slipping, tripping and stumbling without subsequent striking against object, initial encounter: Secondary | ICD-10-CM | POA: Diagnosis present

## 2018-03-21 DIAGNOSIS — D649 Anemia, unspecified: Secondary | ICD-10-CM | POA: Diagnosis not present

## 2018-03-21 DIAGNOSIS — Y92009 Unspecified place in unspecified non-institutional (private) residence as the place of occurrence of the external cause: Secondary | ICD-10-CM | POA: Diagnosis not present

## 2018-03-21 DIAGNOSIS — D630 Anemia in neoplastic disease: Secondary | ICD-10-CM | POA: Diagnosis present

## 2018-03-21 DIAGNOSIS — E785 Hyperlipidemia, unspecified: Secondary | ICD-10-CM | POA: Diagnosis present

## 2018-03-21 DIAGNOSIS — I10 Essential (primary) hypertension: Secondary | ICD-10-CM | POA: Diagnosis present

## 2018-03-21 DIAGNOSIS — R64 Cachexia: Secondary | ICD-10-CM | POA: Diagnosis present

## 2018-03-21 DIAGNOSIS — S27321A Contusion of lung, unilateral, initial encounter: Secondary | ICD-10-CM | POA: Diagnosis present

## 2018-03-21 DIAGNOSIS — Z90710 Acquired absence of both cervix and uterus: Secondary | ICD-10-CM | POA: Diagnosis not present

## 2018-03-21 DIAGNOSIS — Z79899 Other long term (current) drug therapy: Secondary | ICD-10-CM | POA: Diagnosis not present

## 2018-03-21 DIAGNOSIS — S27329A Contusion of lung, unspecified, initial encounter: Secondary | ICD-10-CM | POA: Diagnosis present

## 2018-03-21 DIAGNOSIS — F17210 Nicotine dependence, cigarettes, uncomplicated: Secondary | ICD-10-CM

## 2018-03-21 DIAGNOSIS — C23 Malignant neoplasm of gallbladder: Secondary | ICD-10-CM | POA: Diagnosis present

## 2018-03-21 DIAGNOSIS — E46 Unspecified protein-calorie malnutrition: Secondary | ICD-10-CM | POA: Diagnosis present

## 2018-03-21 DIAGNOSIS — Z823 Family history of stroke: Secondary | ICD-10-CM | POA: Diagnosis not present

## 2018-03-21 DIAGNOSIS — R296 Repeated falls: Secondary | ICD-10-CM | POA: Diagnosis present

## 2018-03-21 DIAGNOSIS — Z9119 Patient's noncompliance with other medical treatment and regimen: Secondary | ICD-10-CM | POA: Diagnosis not present

## 2018-03-21 DIAGNOSIS — Z6823 Body mass index (BMI) 23.0-23.9, adult: Secondary | ICD-10-CM | POA: Diagnosis not present

## 2018-03-21 DIAGNOSIS — S2231XD Fracture of one rib, right side, subsequent encounter for fracture with routine healing: Secondary | ICD-10-CM | POA: Diagnosis not present

## 2018-03-21 LAB — CBC
HCT: 37.7 % (ref 36.0–46.0)
Hemoglobin: 11.8 g/dL — ABNORMAL LOW (ref 12.0–15.0)
MCH: 30.6 pg (ref 26.0–34.0)
MCHC: 31.3 g/dL (ref 30.0–36.0)
MCV: 97.7 fL (ref 80.0–100.0)
Platelets: 250 10*3/uL (ref 150–400)
RBC: 3.86 MIL/uL — ABNORMAL LOW (ref 3.87–5.11)
RDW: 13.8 % (ref 11.5–15.5)
WBC: 7.6 10*3/uL (ref 4.0–10.5)
nRBC: 0 % (ref 0.0–0.2)

## 2018-03-21 LAB — COMPREHENSIVE METABOLIC PANEL
ALBUMIN: 2.7 g/dL — AB (ref 3.5–5.0)
ALT: 12 U/L (ref 0–44)
AST: 26 U/L (ref 15–41)
Alkaline Phosphatase: 66 U/L (ref 38–126)
Anion gap: 6 (ref 5–15)
BUN: 9 mg/dL (ref 8–23)
CO2: 29 mmol/L (ref 22–32)
Calcium: 8.5 mg/dL — ABNORMAL LOW (ref 8.9–10.3)
Chloride: 105 mmol/L (ref 98–111)
Creatinine, Ser: 0.58 mg/dL (ref 0.44–1.00)
GFR calc Af Amer: >60 mL/min (ref >60)
GFR calc non Af Amer: >60 mL/min (ref >60)
GLUCOSE: 123 mg/dL — AB (ref 70–99)
Potassium: 3.7 mmol/L (ref 3.5–5.1)
Sodium: 140 mmol/L (ref 135–145)
Total Bilirubin: 0.8 mg/dL (ref 0.3–1.2)
Total Protein: 8.2 g/dL — ABNORMAL HIGH (ref 6.5–8.1)

## 2018-03-21 LAB — PREALBUMIN: Prealbumin: 8 mg/dL — ABNORMAL LOW (ref 18–38)

## 2018-03-21 LAB — RETICULOCYTES
Immature Retic Fract: 6.2 % (ref 2.3–15.9)
RBC.: 3.94 MIL/uL (ref 3.87–5.11)
Retic Count, Absolute: 39.4 10*3/uL (ref 19.0–186.0)
Retic Ct Pct: 1 % (ref 0.4–3.1)

## 2018-03-21 LAB — IRON AND TIBC
Iron: 38 ug/dL (ref 28–170)
Saturation Ratios: 15 % (ref 10.4–31.8)
TIBC: 254 ug/dL (ref 250–450)
UIBC: 216 ug/dL

## 2018-03-21 LAB — BASIC METABOLIC PANEL
Anion gap: 4 — ABNORMAL LOW (ref 5–15)
BUN: 7 mg/dL — AB (ref 8–23)
CO2: 24 mmol/L (ref 22–32)
Calcium: 7.6 mg/dL — ABNORMAL LOW (ref 8.9–10.3)
Chloride: 112 mmol/L — ABNORMAL HIGH (ref 98–111)
Creatinine, Ser: 0.47 mg/dL (ref 0.44–1.00)
GFR calc Af Amer: >60 mL/min (ref >60)
GFR calc non Af Amer: >60 mL/min (ref >60)
Glucose, Bld: 88 mg/dL (ref 70–99)
Potassium: 3.7 mmol/L (ref 3.5–5.1)
Sodium: 140 mmol/L (ref 135–145)

## 2018-03-21 LAB — FERRITIN: Ferritin: 105 ng/mL (ref 11–307)

## 2018-03-21 LAB — VITAMIN B12: Vitamin B-12: 243 pg/mL (ref 180–914)

## 2018-03-21 LAB — FOLATE: Folate: 11.2 ng/mL (ref >5.9)

## 2018-03-21 LAB — TSH: TSH: 5.045 u[IU]/mL — ABNORMAL HIGH (ref 0.350–4.500)

## 2018-03-21 MED ORDER — ACETAMINOPHEN 650 MG RE SUPP: 650.0000 mg | Freq: Four times a day (QID) | RECTAL | Status: DC | PRN

## 2018-03-21 MED ORDER — VITAMIN B-1 100 MG PO TABS
100.0000 mg | ORAL_TABLET | Freq: Every day | ORAL | Status: DC
  Administered 2018-03-21 – 2018-03-26 (×6): 100 mg via ORAL
  Filled 2018-03-21 (×6): qty 1

## 2018-03-21 MED ORDER — GADOBUTROL 1 MMOL/ML IV SOLN
6.0000 mL | Freq: Once | INTRAVENOUS | Status: AC | PRN
  Administered 2018-03-21: 6 mL via INTRAVENOUS

## 2018-03-21 MED ORDER — ONDANSETRON HCL 4 MG PO TABS: 4.0000 mg | ORAL_TABLET | Freq: Four times a day (QID) | ORAL | Status: DC | PRN

## 2018-03-21 MED ORDER — ACETAMINOPHEN 325 MG PO TABS
650.0000 mg | ORAL_TABLET | Freq: Four times a day (QID) | ORAL | Status: DC | PRN
  Administered 2018-03-24: 650 mg via ORAL
  Filled 2018-03-21 (×3): qty 2

## 2018-03-21 MED ORDER — ONDANSETRON HCL 4 MG/2ML IJ SOLN: 4.0000 mg | Freq: Four times a day (QID) | INTRAMUSCULAR | Status: DC | PRN

## 2018-03-21 NOTE — ED Notes (Signed)
Patient transferred to the hospital bed with NT. Patient was cleaned up, given fresh linen and a gown, new pure-wick applied.

## 2018-03-21 NOTE — Progress Notes (Signed)
Ann Goodwin is a 78 y.o. female with history of alcohol abuse comes in for a fall.  She underwent CT chest and was found to have  Right 10 th rib fracture and possible contusion.  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    Plan: Admit for further evaluation of metastatic GB carcinoma.  Oncology consulted and recommended getting MRI of the liver.  IR consulted for biopsy.  Monitor.   Hosie Poisson, MD [phone]

## 2018-03-21 NOTE — Progress Notes (Signed)
This RN called patient's son, Ann Goodwin, back per request. Patient's son with questions regarding test results and specifics questions regarding cancer diagnosis etc. This RN informed patient's son that the answer to these questions would be provided by the physician and that they make rounds each morning. Patient's son verbalized understanding. Will continue to monitor patient. Carnella [place] I

## 2018-03-21 NOTE — Consult Note (Signed)
Referral MD  Reason for Referral: Progressive gallbladder mass  Chief Complaint  Patient presents with  . Fall  : I fell in I have pain on my right side.  HPI: Ann Goodwin is a very nice 78 year old African-American female.  She lives with her 2 sons.  Somehow she thought that I had been to her house before.  It is obvious that she has had a diagnosis of malignancy.  She was seen back in July 2018 by Dr. Burr Medico, of the [facility].  She was seen because of the gallbladder mass.  Dr. Burr Medico wanted to get her to surgery.  For some reason, I am not sure what happened.  Ann Goodwin still does not believe that she has cancer.  She was admitted after falling.  She is gotten weak.  Is hard to say if she is lost weight.  She had a CT scan done.  This is done of the chest/abdomen/pelvis.  This showed progression of her gallbladder mass.  It was infiltrated into the adjacent liver.  And now measures 6.6 x 9.3 cm.  There is no obvious adenopathy.  There is no bone disease.  She has no lung metastasis although she has a focal consolidation in the right lung base.  Lab work done today shows her white cell count to be 7.6.  Hemoglobin 11.8.  Platelet count is 250,000.  Her liver function studies look okay.  Her albumin is 2.7.  Calcium 8.5.  Her sodium is 140 with a potassium of 3.7.  Her iron studies show a ferritin of 105 with iron saturation of 15%.  She does not remember seen any doctors before.  Currently, I would say her performance status is ECOG 3.  She used to smoke.  She stopped smoking when she came to the hospital.  She says she smoked a pack a day of cigarettes.  She says that she really does not drink alcohol.     Past Medical History:  Diagnosis Date  . Alcohol abuse   . Chronic edema   . Dilated aortic root ([place]) 08/28/2016  . Hyperlipidemia   . Hypertension   . Hypothyroid   . Infestation by bed bug   . Tobacco abuse   . Venous stasis   :  Past Surgical  History:  Procedure Laterality Date  . ABDOMINAL HYSTERECTOMY    . BUNIONECTOMY    :   Current Facility-Administered Medications:  .  acetaminophen (TYLENOL) tablet 650 mg, 650 mg, Oral, Q6H PRN **OR** acetaminophen (TYLENOL) suppository 650 mg, 650 mg, Rectal, Q6H PRN, Rise Patience, MD .  lidocaine (LIDODERM) 5 % 1 patch, 1 patch, Transdermal, Q24H, Rise Patience, MD, 1 patch at 03/20/18 1812 .  ondansetron (ZOFRAN) tablet 4 mg, 4 mg, Oral, Q6H PRN **OR** ondansetron (ZOFRAN) injection 4 mg, 4 mg, Intravenous, Q6H PRN, Rise Patience, MD .  thiamine (VITAMIN B-1) tablet 100 mg, 100 mg, Oral, Daily, Rise Patience, MD, 100 mg at 03/21/18 0709:  . lidocaine  1 patch Transdermal Q24H  . thiamine  100 mg Oral Daily  :  No Known Allergies:  Family History  Problem Relation Age of Onset  . Stroke Mother   . Deep vein thrombosis Neg Hx   . Pulmonary embolism Neg Hx   :  Social History   Socioeconomic History  . Marital status: Single    Spouse name: Not on file  . Number of children: Not on file  . Years of  education: Not on file  . Highest education level: Not on file  Occupational History  . Not on file  Social Needs  . Financial resource strain: Not on file  . Food insecurity:    Worry: Not on file    Inability: Not on file  . Transportation needs:    Medical: Not on file    Non-medical: Not on file  Tobacco Use  . Smoking status: Current Every Day Smoker    Packs/day: 0.50    Types: Cigarettes  . Smokeless tobacco: Never Used  Substance and Sexual Activity  . Alcohol use: Yes    Alcohol/week: 2.0 standard drinks    Types: 2 Cans of beer per week    Comment: 2 or 3 40 oz every day  . Drug use: No  . Sexual activity: Not on file  Lifestyle  . Physical activity:    Days per week: Not on file    Minutes per session: Not on file  . Stress: Not on file  Relationships  . Social connections:    Talks on phone: Not on file    Gets  together: Not on file    Attends religious service: Not on file    Active member of club or organization: Not on file    Attends meetings of clubs or organizations: Not on file    Relationship status: Not on file  . Intimate partner violence:    Fear of current or ex partner: Not on file    Emotionally abused: Not on file    Physically abused: Not on file    Forced sexual activity: Not on file  Other Topics Concern  . Not on file  Social History Narrative  . Not on file  :  Pertinent items are noted in HPI.  Exam: Somewhat feeble appearing African-American female.  She is very pleasant.  She has a poor memory.  Her head neck exam shows no scleral icterus.  She has no adenopathy in the neck.  She has no intraoral lesions.  Thyroid is nonpalpable.  Lungs are clear bilaterally.  Cardiac exam regular rate and rhythm with no murmurs, rubs or bruits.  Abdomen is soft.  Bowel sounds are decreased.  There is no fluid wave.  There is some fullness in the right upper quadrant.  There is tenderness to palpation of the right upper quadrant.  Extremities shows some mild pitting edema in her legs.  Neurological exam shows no focal neurological deficits.  She again has some memory issues.  Skin exam shows no rashes, ecchymoses or petechia.  Patient Vitals for the past 24 hrs:  BP Temp Temp src Pulse Resp SpO2 Height Weight  03/21/18 1455 134/78 100 F (37.8 C) Oral 65 18 97 % 5\' 6"  (1.676 m) 145 lb 1 oz (65.8 kg)  03/21/18 1404 (!) 149/77 - - 75 (!) 21 99 % - -  03/21/18 1400 (!) 149/77 - - 69 (!) 22 - - -  03/21/18 1300 (!) 142/67 - - - 20 - - -  03/21/18 1125 140/73 - - 61 (!) 23 99 % - -  03/21/18 1100 140/73 - - 63 (!) 24 - - -  03/21/18 1000 138/78 - - - 17 - - -  03/21/18 0911 (!) 150/82 - - (!) 57 17 98 % - -  03/21/18 0900 (!) 150/82 - - 69 20 - - -  03/21/18 0700 - - - (!) 56 16 - - -  03/21/18 0600 (!) 111/59 - - Marland Kitchen)  50 15 98 % - -  03/21/18 0400 (!) 141/77 - - - 18 98 % - -  03/21/18  0300 115/63 - - (!) 45 15 98 % - -  03/21/18 0200 (!) 157/83 - - 65 19 99 % - -  03/21/18 0100 128/65 - - (!) 47 15 98 % - -  03/21/18 0004 107/68 - - (!) 48 15 97 % - -  03/20/18 2100 (!) 158/79 - - 83 16 99 % - -  03/20/18 2004 - - - 61 16 - - -  03/20/18 2003 121/68 - - 61 16 99 % - -  03/20/18 1900 115/63 - - 63 (!) 23 99 % - -  03/20/18 1734 139/73 - - 68 17 100 % - -  03/20/18 1731 139/73 - - - (!) 24 - - -     Recent Labs    03/20/18 1754 03/21/18 0641  WBC 9.2 7.6  HGB 11.1* 11.8*  HCT 34.7* 37.7  PLT 268 250   Recent Labs    03/21/18 0701 03/21/18 1042  NA 140 140  K 3.7 3.7  CL 112* 105  CO2 24 29  GLUCOSE 88 123*  BUN 7* 9  CREATININE 0.47 0.58  CALCIUM 7.6* 8.5*    Blood smear review: None  Pathology: None    Assessment and Plan: Ann Goodwin is a 78 year old African-American female.  She had a known gallbladder mass 2 years ago.  She did not like to have anything done with this.  Again I am not sure exactly what happened.  I have to believe that she chose not to acknowledge that she had a malignancy.  Clearly, this mass has worsened.  I would highly doubt that she is a candidate for surgery at this point.  She does need to have a biopsy done.  This will give [place] the histology.  We would then send the tissue off for molecular profiling.  She definitely had a curable disease 2 years ago.  I am sure that this gallbladder mass could have been resected.  She could have had adjuvant therapy.  Right now, her performance status is just not that good and I do not feel that she is a candidate for systemic therapy.  Unfortunately, nobody else was in the room.  I am not sure that she will really be able to comprehend the significance of her problem.  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  I probably would do Dopplers of her legs to make sure that she does not have a blood clot.  She is at significant  risk for thromboembolic disease.  She is having MRI of the liver which can give [place] a better idea as to the extent of her involvement of the liver by this mass.  I spent about an hour with her today.  I had to keep repeating myself as to what was wrong with Ms. Ruppe.  She does have a strong faith.  She did want me to pray with her.  I enjoyed doing this.  I am not sure what other lab tests would help [place] out.  A CA 19-9 and CEA might be reasonable to get.  We will follow along.  I would definitely make sure surgery sees her to get their input regarding surgery.  Lattie Haw, MD  Jeneen Rinks 4:10

## 2018-03-21 NOTE — H&P (Signed)
History and Physical    Ann Goodwin JQZ:[id number] DOB: 14-Aug-1940 DOA: 03/20/2018  PCP: Benito Mccreedy, MD  Patient coming from: Home.  Chief Complaint: Fall.  HPI: Ann Goodwin is a 78 y.o. female with history of alcohol abuse which patient states she has cut down and only drinks once or twice a week had a fall at home when she was trying to sit on a chair.  Patient states she lost her balance and fell.  Denies hitting her head or losing consciousness.  She fell onto her right side and hurt her right side of the chest and since then is been hurting her.  ED Course: In the ER patient had CT scan of the chest done which shows pulmonary contusion and right 10th rib fracture.  Dr. Redmond Pulling on-call trauma surgeon was consulted by the ER physician who requested observation and pulmonary toilet and incentive spirometer.  EKG shows sinus rhythm at 60 bpm.  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  Patient is not aware of this cancer.  Review of Systems: As per HPI, rest all negative.   Past Medical History:  Diagnosis Date  . Alcohol abuse   . Chronic edema   . Dilated aortic root ([place]) 08/28/2016  . Hyperlipidemia   . Hypertension   . Hypothyroid   . Infestation by bed bug   . Tobacco abuse   . Venous stasis     Past Surgical History:  Procedure Laterality Date  . ABDOMINAL HYSTERECTOMY    . BUNIONECTOMY       reports that she has been smoking cigarettes. She has been smoking about 0.50 packs per day. She has never used smokeless tobacco. She reports current alcohol use of about 2.0 standard drinks of alcohol per week. She reports that she does not use drugs.  No Known Allergies  Family History  Problem Relation Age of Onset  . Stroke Mother   . Deep vein thrombosis Neg Hx   . Pulmonary embolism Neg Hx     Prior to Admission medications   Medication Sig Start Date End Date Taking? Authorizing  Provider  acetaminophen (TYLENOL) 500 MG tablet Take 500 mg by mouth every 6 (six) hours as needed for moderate pain.   Yes [provider]  potassium chloride SA (K-DUR,KLOR-CON) 20 MEQ tablet Take 1 tablet (20 mEq total) by mouth daily. Patient not taking: Reported on 03/20/2018 08/28/16   Charlie Pitter, PA-C    Physical Exam: Vitals:   03/20/18 2003 03/20/18 2004 03/20/18 2100 03/21/18 0004  BP: 121/68  (!) 158/79 107/68  Pulse: 61 61 83 (!) 48  Resp: 16 16 16 15   Temp:      TempSrc:      SpO2: 99%  99% 97%      Constitutional: Moderately built and nourished. Vitals:   03/20/18 2003 03/20/18 2004 03/20/18 2100 03/21/18 0004  BP: 121/68  (!) 158/79 107/68  Pulse: 61 61 83 (!) 48  Resp: 16 16 16 15   Temp:      TempSrc:      SpO2: 99%  99% 97%   Eyes: Anicteric no pallor. ENMT: No discharge from the ears eyes nose or mouth. Neck: No mass felt.  No neck rigidity. Respiratory: No rhonchi or crepitations. Cardiovascular: S1-S2 heard. Abdomen: Soft nontender bowel sounds present. Musculoskeletal: Pain on palpating right side of the rib on the lower aspect. Skin: No rash. Neurologic: Alert  awake oriented to time place and person.  Moves all extremities. Psychiatric: Appears normal.   Labs on Admission: I have personally reviewed following labs and imaging studies  CBC: Recent Labs  Lab 03/20/18 1754  WBC 9.2  NEUTROABS 6.0  HGB 11.1*  HCT 34.7*  MCV 96.9  PLT 324   Basic Metabolic Panel: Recent Labs  Lab 03/20/18 1754  NA 136  K 3.2*  CL 102  CO2 27  GLUCOSE 99  BUN 8  CREATININE 0.59  CALCIUM 8.5*   GFR: CrCl cannot be calculated (Unknown ideal weight.). Liver Function Tests: Recent Labs  Lab 03/20/18 1754  AST 19  ALT 11  ALKPHOS 69  BILITOT 0.8  PROT 8.5*  ALBUMIN 2.9*   No results for input(s): LIPASE, AMYLASE in the last 168 hours. No results for input(s): AMMONIA in the last 168 hours. Coagulation Profile: No results for  input(s): INR, PROTIME in the last 168 hours. Cardiac Enzymes: No results for input(s): CKTOTAL, CKMB, CKMBINDEX, TROPONINI in the last 168 hours. BNP (last 3 results) No results for input(s): PROBNP in the last 8760 hours. HbA1C: No results for input(s): HGBA1C in the last 72 hours. CBG: No results for input(s): GLUCAP in the last 168 hours. Lipid Profile: No results for input(s): CHOL, HDL, LDLCALC, TRIG, CHOLHDL, LDLDIRECT in the last 72 hours. Thyroid Function Tests: No results for input(s): TSH, T4TOTAL, FREET4, T3FREE, THYROIDAB in the last 72 hours. Anemia Panel: No results for input(s): VITAMINB12, FOLATE, FERRITIN, TIBC, IRON, RETICCTPCT in the last 72 hours. Urine analysis:    Component Value Date/Time   COLORURINE STRAW (A) 03/20/2018 1823   APPEARANCEUR CLEAR 03/20/2018 1823   LABSPEC 1.002 (L) 03/20/2018 1823   PHURINE 7.0 03/20/2018 1823   GLUCOSEU NEGATIVE 03/20/2018 1823   HGBUR NEGATIVE 03/20/2018 1823   BILIRUBINUR NEGATIVE 03/20/2018 1823   KETONESUR NEGATIVE 03/20/2018 1823   PROTEINUR NEGATIVE 03/20/2018 1823   UROBILINOGEN 1.0 09/21/2012 2140   NITRITE NEGATIVE 03/20/2018 1823   LEUKOCYTESUR NEGATIVE 03/20/2018 1823   Sepsis Labs: @LABRCNTIP (procalcitonin:4,lacticidven:4) )No results found for this or any previous visit (from the past 240 hour(s)).   Radiological Exams on Admission: Dg Ribs Unilateral W/chest Right  Result Date: 03/20/2018 CLINICAL DATA:  Acute chest and RIGHT rib pain following fall today. EXAM: RIGHT RIBS AND CHEST - 3+ VIEW COMPARISON:  08/25/2016 and prior radiographs FINDINGS: UPPER limits normal heart size and mild LEFT basilar scarring again noted. There is no evidence of focal airspace disease, pulmonary edema, suspicious pulmonary nodule/mass, pleural effusion, or pneumothorax. A nondisplaced fracture of the anterior RIGHT 10th rib is present. IMPRESSION: Nondisplaced anterior RIGHT 10th rib fracture. No pleural effusion or  pneumothorax. Electronically Signed   By: Margarette Canada M.D.   On: 03/20/2018 15:39   Ct Chest W Contrast  Result Date: 03/20/2018 CLINICAL DATA:  Right-sided rib pain after mechanical fall. EXAM: CT CHEST, ABDOMEN, AND PELVIS WITH CONTRAST TECHNIQUE: Multidetector CT imaging of the chest, abdomen and pelvis was performed following the standard protocol during bolus administration of intravenous contrast. CONTRAST:  125mL ISOVUE-300 IOPAMIDOL (ISOVUE-300) INJECTION 61% COMPARISON:  CT abdomen and pelvis 08/28/2016. MRI abdomen 08/30/2016. FINDINGS: CT CHEST FINDINGS Cardiovascular: Normal caliber thoracic aorta. Scattered aortic calcifications. Great vessel origins are patent. Mild cardiac enlargement. No significant pericardial effusion. Coronary artery calcifications. Mediastinum/Nodes: Esophagus is decompressed. No significant lymphadenopathy in the chest. Lungs/Pleura: Scattered emphysematous changes in the lungs. Mild dependent atelectasis. Focal area consolidation in the right lung base could represent pneumonia  possibly aspiration or contusion. No pleural effusions. No pneumothorax. Musculoskeletal: Degenerative changes in the spine. No vertebral compression deformities. Normal alignment. Sternum appears intact. Minimally displaced fracture of the right anterolateral tenth rib. CT ABDOMEN PELVIS FINDINGS Hepatobiliary: Large poorly defined hypoenhancing mass replacing the gallbladder and with infiltration into the adjacent liver involving segments 4 and 5. There is infiltration in the adjacent fat suggesting local spread. 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 Fat planes with the adjacent duodenum and splenic flexure of the colon or non distinct suggesting possible direct invasion. No bile duct dilatation. Pancreas: Unremarkable. No pancreatic ductal dilatation or surrounding inflammatory changes.  Spleen: Normal in size without focal abnormality. Adrenals/Urinary Tract: Adrenal glands are unremarkable. Kidneys are normal, without renal calculi, focal lesion, or hydronephrosis. Bladder is unremarkable. Stomach/Bowel: Stomach, small bowel, and colon are mostly decompressed. Colonic diverticula without evidence of diverticulitis. Appendix is not identified. Vascular/Lymphatic: Calcification of the aorta. No aneurysm. No significant lymphadenopathy. Reproductive: Surgical absence of the gallbladder. Other: Free fluid in the pelvis may be reactive or malignant. No free air. Abdominal wall musculature appears intact. Musculoskeletal: Degenerative changes in the spine and hips. Schmorl's node at L1. No destructive bone lesions. No acute fractures are identified. IMPRESSION: 1. Minimally displaced fracture of the right anterolateral tenth rib. 2. Focal consolidation in the right lung base could represent pneumonia, contusion, or aspiration. 3. 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 4. Free fluid in the pelvis may be reactive or malignant. 5. Colonic diverticulosis without evidence of diverticulitis. Aortic Atherosclerosis (ICD10-I70.0). Electronically Signed   By: Lucienne Capers M.D.   On: 03/20/2018 22:11   Ct Abdomen Pelvis W Contrast  Result Date: 03/20/2018 CLINICAL DATA:  Right-sided rib pain after mechanical fall. EXAM: CT CHEST, ABDOMEN, AND PELVIS WITH CONTRAST TECHNIQUE: Multidetector CT imaging of the chest, abdomen and pelvis was performed following the standard protocol during bolus administration of intravenous contrast. CONTRAST:  181mL ISOVUE-300 IOPAMIDOL (ISOVUE-300) INJECTION 61% COMPARISON:  CT abdomen and pelvis 08/28/2016. MRI abdomen 08/30/2016. FINDINGS: CT CHEST FINDINGS Cardiovascular: Normal caliber thoracic aorta. Scattered aortic  calcifications. Great vessel origins are patent. Mild cardiac enlargement. No significant pericardial effusion. Coronary artery calcifications. Mediastinum/Nodes: Esophagus is decompressed. No significant lymphadenopathy in the chest. Lungs/Pleura: Scattered emphysematous changes in the lungs. Mild dependent atelectasis. Focal area consolidation in the right lung base could represent pneumonia possibly aspiration or contusion. No pleural effusions. No pneumothorax. Musculoskeletal: Degenerative changes in the spine. No vertebral compression deformities. Normal alignment. Sternum appears intact. Minimally displaced fracture of the right anterolateral tenth rib. CT ABDOMEN PELVIS FINDINGS Hepatobiliary: Large poorly defined hypoenhancing mass replacing the gallbladder and with infiltration into the adjacent liver involving segments 4 and 5. There is infiltration in the adjacent fat suggesting local spread. 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 Fat planes with the adjacent duodenum and splenic flexure of the colon or non distinct suggesting possible direct invasion. No bile duct dilatation. Pancreas: Unremarkable. No pancreatic ductal dilatation or surrounding inflammatory changes. Spleen: Normal in size without focal abnormality. Adrenals/Urinary Tract: Adrenal glands are unremarkable. Kidneys are normal, without renal calculi, focal lesion, or hydronephrosis. Bladder is unremarkable. Stomach/Bowel: Stomach, small bowel, and colon are mostly decompressed. Colonic diverticula without  evidence of diverticulitis. Appendix is not identified. Vascular/Lymphatic: Calcification of the aorta. No aneurysm. No significant lymphadenopathy. Reproductive: Surgical absence of the gallbladder. Other: Free fluid in the pelvis may be reactive or malignant. No free air. Abdominal wall musculature appears intact.  Musculoskeletal: Degenerative changes in the spine and hips. Schmorl's node at L1. No destructive bone lesions. No acute fractures are identified. IMPRESSION: 1. Minimally displaced fracture of the right anterolateral tenth rib. 2. Focal consolidation in the right lung base could represent pneumonia, contusion, or aspiration. 3. 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 4. Free fluid in the pelvis may be reactive or malignant. 5. Colonic diverticulosis without evidence of diverticulitis. Aortic Atherosclerosis (ICD10-I70.0). Electronically Signed   By: Lucienne Capers M.D.   On: 03/20/2018 22:11    EKG: Independently reviewed.  Normal sinus rhythm with nonspecific ST-T changes.  Heart rate is around 60 bpm.  Assessment/Plan Active Problems:   Right pulmonary contusion   Closed fracture of one rib of right side   Normochromic normocytic anemia   Pulmonary contusion    1. Fall with right pulmonary contusion and right rib fracture -Dr. Redmond Pulling on-call trauma surgeon advised observation and pulmonary toilet and incentive spirometer.  Since patient also states he off-and-on gets headaches CT head is pending.  Will need physical therapy consult. 2. CAT scan abdomen finding concerning for progression of gallbladder carcinoma -patient states she never knew that she had a history of this previously.  Reviewing patient's chart patient was noticed to have these findings in July 2018.  Please consult oncologist in the morning for further recommendation. 3. Normocytic normochromic anemia -check anemia panel with next blood draw.  Follow CBC. 4. History of alcoholism per the chart.  Patient states he only drinks once or twice a week.  Closely observe any withdrawal we will keep patient on thiamine.  Patient's previous chart unrevealing state that patient has  history of hypertension and hypothyroidism.  Patient states he does not take any medication except for PRN NSAIDs for headache.  Will check TSH and also observe loosely patient's blood pressure trends.   DVT prophylaxis: SCDs for now if indicated that should be further work-up on gallbladder carcinoma. Code Status: Full code. Family Communication: Discussed with patient. Disposition Plan: To be determined. Consults called: Physical therapy. Admission status: Observation.   Rise Patience MD [facility] Pager [phone].  If 7PM-7AM, please contact night-coverage [email or web address] Password [facility]  03/21/2018, 12:18 AM

## 2018-03-21 NOTE — ED Notes (Signed)
PT at bedside.

## 2018-03-21 NOTE — Progress Notes (Signed)
Chief Complaint: Patient was seen in consultation today for hepatic mass at the request of Dr. Karleen Hampshire, Dr. Marin Olp  Referring Physician(s): Dr. Karleen Hampshire Dr. Marin Olp  Supervising Physician: Markus Daft  Patient Status: [facility] - In-pt  History of Present Illness: Ann Goodwin is a 78 y.o. female who fell at home and came to the ER. She was found to have fx of right 10th rib, but also gallbladder/hepatic mass that has progressed since 08/2016. She was initially diagnosed with GB mass in 08/2016 but apparently was not compliant with follow up. She does not recall anything about being told she had a mass. She reports some right sided pain but no N/V. Denies pain with eating. She has been admitted and IR is asked to perform biopsy to expedite workup of this progressive gallbladder/liver mass. PMHx, meds, labs, imaging, allergies reviewed. Feels a little better since arrival.   Past Medical History:  Diagnosis Date  . Alcohol abuse   . Chronic edema   . Dilated aortic root ([place] 08/28/2016  . Hyperlipidemia   . Hypertension   . Hypothyroid   . Infestation by bed bug   . Tobacco abuse   . Venous stasis     Past Surgical History:  Procedure Laterality Date  . ABDOMINAL HYSTERECTOMY    . BUNIONECTOMY      Allergies: Patient has no known allergies.  Medications:  Current Facility-Administered Medications:  .  acetaminophen (TYLENOL) tablet 650 mg, 650 mg, Oral, Q6H PRN **OR** acetaminophen (TYLENOL) suppository 650 mg, 650 mg, Rectal, Q6H PRN, Rise Patience, MD .  lidocaine (LIDODERM) 5 % 1 patch, 1 patch, Transdermal, Q24H, Rise Patience, MD, 1 patch at 03/20/18 1812 .  ondansetron (ZOFRAN) tablet 4 mg, 4 mg, Oral, Q6H PRN **OR** ondansetron (ZOFRAN) injection 4 mg, 4 mg, Intravenous, Q6H PRN, Rise Patience, MD .  thiamine (VITAMIN B-1) tablet 100 mg, 100 mg, Oral, Daily, Rise Patience, MD, 100 mg at 03/21/18 7096    Family History  Problem  Relation Age of Onset  . Stroke Mother   . Deep vein thrombosis Neg Hx   . Pulmonary embolism Neg Hx     Social History   Socioeconomic History  . Marital status: Single    Spouse name: Not on file  . Number of children: Not on file  . Years of education: Not on file  . Highest education level: Not on file  Occupational History  . Not on file  Social Needs  . Financial resource strain: Not on file  . Food insecurity:    Worry: Not on file    Inability: Not on file  . Transportation needs:    Medical: Not on file    Non-medical: Not on file  Tobacco Use  . Smoking status: Current Every Day Smoker    Packs/day: 0.50    Types: Cigarettes  . Smokeless tobacco: Never Used  Substance and Sexual Activity  . Alcohol use: Yes    Alcohol/week: 2.0 standard drinks    Types: 2 Cans of beer per week    Comment: 2 or 3 40 oz every day  . Drug use: No  . Sexual activity: Not on file  Lifestyle  . Physical activity:    Days per week: Not on file    Minutes per session: Not on file  . Stress: Not on file  Relationships  . Social connections:    Talks on phone: Not on file    Gets together: Not  on file    Attends religious service: Not on file    Active member of club or organization: Not on file    Attends meetings of clubs or organizations: Not on file    Relationship status: Not on file  Other Topics Concern  . Not on file  Social History Narrative  . Not on file     Review of Systems: A 12 point ROS discussed and pertinent positives are indicated in the HPI above.  All other systems are negative.  Review of Systems  Vital Signs: BP 134/78 (BP Location: Left Arm)   Pulse 65   Temp 100 F (37.8 C) (Oral)   Resp 18   Ht 5\' 6"  (1.676 m)   Wt 65.8 kg   SpO2 97%   BMI 23.41 kg/m   Physical Exam Constitutional:      Appearance: Normal appearance.  HENT:     Mouth/Throat:     Mouth: Mucous membranes are moist.     Pharynx: Oropharynx is clear.  Cardiovascular:      Rate and Rhythm: Normal rate and regular rhythm.     Heart sounds: Normal heart sounds.  Pulmonary:     Effort: Pulmonary effort is normal. No respiratory distress.     Breath sounds: Normal breath sounds.  Abdominal:     General: Abdomen is flat.     Palpations: Abdomen is soft. There is no mass.     Comments: Mildly tender (R)inferior ribs and RUQ  Neurological:     Mental Status: She is alert.       Imaging: Dg Ribs Unilateral W/chest Right  Result Date: 03/20/2018 CLINICAL DATA:  Acute chest and RIGHT rib pain following fall today. EXAM: RIGHT RIBS AND CHEST - 3+ VIEW COMPARISON:  08/25/2016 and prior radiographs FINDINGS: UPPER limits normal heart size and mild LEFT basilar scarring again noted. There is no evidence of focal airspace disease, pulmonary edema, suspicious pulmonary nodule/mass, pleural effusion, or pneumothorax. A nondisplaced fracture of the anterior RIGHT 10th rib is present. IMPRESSION: Nondisplaced anterior RIGHT 10th rib fracture. No pleural effusion or pneumothorax. Electronically Signed   By: Margarette Canada M.D.   On: 03/20/2018 15:39   Dg Pelvis 1-2 Views  Result Date: 03/21/2018 CLINICAL DATA:  Trauma secondary to a fall at home yesterday. EXAM: PELVIS - 1-2 VIEW COMPARISON:  CT scan dated 03/20/2018 FINDINGS: There is no evidence of pelvic fracture or diastasis. There are slight arthritic changes of both hips. Bowel gas pattern is normal. IMPRESSION: No acute abnormalities of the pelvis. Electronically Signed   By: Lorriane Shire M.D.   On: 03/21/2018 08:09   Ct Head Wo Contrast  Result Date: 03/21/2018 CLINICAL DATA:  Altered level of consciousness, history of alcohol abuse, hypertension, smoker EXAM: CT HEAD WITHOUT CONTRAST TECHNIQUE: Contiguous axial images were obtained from the base of the skull through the vertex without intravenous contrast. Sagittal and coronal MPR images reconstructed from axial data set. COMPARISON:  06/10/2015 FINDINGS: Brain:  Generalized atrophy. Normal ventricular morphology. No midline shift or mass effect. Probable small vessel chronic ischemic changes of deep cerebral white matter. No intracranial hemorrhage, mass lesion, evidence of acute infarction, or extra-axial fluid collection. Vascular: Atherosclerotic calcifications of internal carotid arteries at skull base Skull: Intact Sinuses/Orbits: Frontal sinus osteoma unchanged. Paranasal sinuses and mastoid air cells otherwise clear. Other: N/A IMPRESSION: Atrophy with probable small vessel chronic ischemic changes of deep cerebral white matter. No acute intracranial abnormalities. Electronically Signed   By: Elta Guadeloupe  Thornton Papas M.D.   On: 03/21/2018 08:00   Ct Chest W Contrast  Result Date: 03/20/2018 CLINICAL DATA:  Right-sided rib pain after mechanical fall. EXAM: CT CHEST, ABDOMEN, AND PELVIS WITH CONTRAST TECHNIQUE: Multidetector CT imaging of the chest, abdomen and pelvis was performed following the standard protocol during bolus administration of intravenous contrast. CONTRAST:  130mL ISOVUE-300 IOPAMIDOL (ISOVUE-300) INJECTION 61% COMPARISON:  CT abdomen and pelvis 08/28/2016. MRI abdomen 08/30/2016. FINDINGS: CT CHEST FINDINGS Cardiovascular: Normal caliber thoracic aorta. Scattered aortic calcifications. Great vessel origins are patent. Mild cardiac enlargement. No significant pericardial effusion. Coronary artery calcifications. Mediastinum/Nodes: Esophagus is decompressed. No significant lymphadenopathy in the chest. Lungs/Pleura: Scattered emphysematous changes in the lungs. Mild dependent atelectasis. Focal area consolidation in the right lung base could represent pneumonia possibly aspiration or contusion. No pleural effusions. No pneumothorax. Musculoskeletal: Degenerative changes in the spine. No vertebral compression deformities. Normal alignment. Sternum appears intact. Minimally displaced fracture of the right anterolateral tenth rib. CT ABDOMEN PELVIS FINDINGS  Hepatobiliary: Large poorly defined hypoenhancing mass replacing the gallbladder and with infiltration into the adjacent liver involving segments 4 and 5. There is infiltration in the adjacent fat suggesting local spread. 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 Fat planes with the adjacent duodenum and splenic flexure of the colon or non distinct suggesting possible direct invasion. No bile duct dilatation. Pancreas: Unremarkable. No pancreatic ductal dilatation or surrounding inflammatory changes. Spleen: Normal in size without focal abnormality. Adrenals/Urinary Tract: Adrenal glands are unremarkable. Kidneys are normal, without renal calculi, focal lesion, or hydronephrosis. Bladder is unremarkable. Stomach/Bowel: Stomach, small bowel, and colon are mostly decompressed. Colonic diverticula without evidence of diverticulitis. Appendix is not identified. Vascular/Lymphatic: Calcification of the aorta. No aneurysm. No significant lymphadenopathy. Reproductive: Surgical absence of the gallbladder. Other: Free fluid in the pelvis may be reactive or malignant. No free air. Abdominal wall musculature appears intact. Musculoskeletal: Degenerative changes in the spine and hips. Schmorl's node at L1. No destructive bone lesions. No acute fractures are identified. IMPRESSION: 1. Minimally displaced fracture of the right anterolateral tenth rib. 2. Focal consolidation in the right lung base could represent pneumonia, contusion, or aspiration. 3. 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 4. Free fluid in the pelvis may be reactive or malignant. 5. Colonic diverticulosis without evidence of diverticulitis. Aortic Atherosclerosis (ICD10-I70.0). Electronically Signed   By:  Lucienne Capers M.D.   On: 03/20/2018 22:11   Ct Abdomen Pelvis W Contrast  Result Date: 03/20/2018 CLINICAL DATA:  Right-sided rib pain after mechanical fall. EXAM: CT CHEST, ABDOMEN, AND PELVIS WITH CONTRAST TECHNIQUE: Multidetector CT imaging of the chest, abdomen and pelvis was performed following the standard protocol during bolus administration of intravenous contrast. CONTRAST:  168mL ISOVUE-300 IOPAMIDOL (ISOVUE-300) INJECTION 61% COMPARISON:  CT abdomen and pelvis 08/28/2016. MRI abdomen 08/30/2016. FINDINGS: CT CHEST FINDINGS Cardiovascular: Normal caliber thoracic aorta. Scattered aortic calcifications. Great vessel origins are patent. Mild cardiac enlargement. No significant pericardial effusion. Coronary artery calcifications. Mediastinum/Nodes: Esophagus is decompressed. No significant lymphadenopathy in the chest. Lungs/Pleura: Scattered emphysematous changes in the lungs. Mild dependent atelectasis. Focal area consolidation in the right lung base could represent pneumonia possibly aspiration or contusion. No pleural effusions. No pneumothorax. Musculoskeletal: Degenerative changes in the spine. No vertebral compression deformities. Normal alignment. Sternum appears intact. Minimally displaced fracture of the right  anterolateral tenth rib. CT ABDOMEN PELVIS FINDINGS Hepatobiliary: Large poorly defined hypoenhancing mass replacing the gallbladder and with infiltration into the adjacent liver involving segments 4 and 5. There is infiltration in the adjacent fat suggesting local spread. 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 Fat planes with the adjacent duodenum and splenic flexure of the colon or non distinct suggesting possible direct invasion. No bile duct dilatation. Pancreas: Unremarkable. No pancreatic ductal dilatation or surrounding inflammatory changes. Spleen: Normal in size without  focal abnormality. Adrenals/Urinary Tract: Adrenal glands are unremarkable. Kidneys are normal, without renal calculi, focal lesion, or hydronephrosis. Bladder is unremarkable. Stomach/Bowel: Stomach, small bowel, and colon are mostly decompressed. Colonic diverticula without evidence of diverticulitis. Appendix is not identified. Vascular/Lymphatic: Calcification of the aorta. No aneurysm. No significant lymphadenopathy. Reproductive: Surgical absence of the gallbladder. Other: Free fluid in the pelvis may be reactive or malignant. No free air. Abdominal wall musculature appears intact. Musculoskeletal: Degenerative changes in the spine and hips. Schmorl's node at L1. No destructive bone lesions. No acute fractures are identified. IMPRESSION: 1. Minimally displaced fracture of the right anterolateral tenth rib. 2. Focal consolidation in the right lung base could represent pneumonia, contusion, or aspiration. 3. 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 4. Free fluid in the pelvis may be reactive or malignant. 5. Colonic diverticulosis without evidence of diverticulitis. Aortic Atherosclerosis (ICD10-I70.0). Electronically Signed   By: Lucienne Capers M.D.   On: 03/20/2018 22:11    Labs:  CBC: Recent Labs    03/20/18 1754 03/21/18 0641  WBC 9.2 7.6  HGB 11.1* 11.8*  HCT 34.7* 37.7  PLT 268 250    COAGS: No results for input(s): INR, APTT in the last 8760 hours.  BMP: Recent Labs    03/20/18 1754 03/21/18 0701 03/21/18 1042  NA 136 140 140  K 3.2* 3.7 3.7  CL 102 112* 105  CO2 27 24 29   GLUCOSE 99 88 123*  BUN 8 7* 9  CALCIUM 8.5* 7.6* 8.5*  CREATININE 0.59 0.47 0.58  GFRNONAA >60 >60 >60  GFRAA >60 >60 >60    LIVER FUNCTION TESTS: Recent Labs    03/20/18 1754 03/21/18 1042  BILITOT 0.8 0.8  AST 19 26  ALT 11 12  ALKPHOS  69 66  PROT 8.5* 8.2*  ALBUMIN 2.9* 2.7*    TUMOR MARKERS: No results for input(s): AFPTM, CEA, CA199, CHROMGRNA in the last 8760 hours.  Assessment and Plan: Gallbladder/hepatic mass. Amenable to US biopsy but agree with MRI first for more detail. IR can accommodate biopsy on Monday if pt remains inpt. If she is discharged, we can otherwise schedule her for outpt procedure. Labs reviewed. Risks and benefits discussed with the patient including, but not limited to bleeding, infection, damage to adjacent structures or low yield requiring additional tests.  All of the patient's questions were answered, patient is agreeable to proceed. Consent signed and in chart.    Thank you for this interesting consult.  I greatly enjoyed meeting FARA WORTHY and look forward to participating in their care.  A copy of this report was sent to the requesting provider on this date.  Electronically Signed: Ascencion Dike, PA-C 03/21/2018, 4:04 PM   I spent a total of 20 minutes in face to face in clinical consultation, greater than 50%  of which was counseling/coordinating care for liver biopsy

## 2018-03-21 NOTE — ED Notes (Signed)
Patient transported to CT and XR 

## 2018-03-21 NOTE — ED Notes (Signed)
ED TO INPATIENT HANDOFF REPORT  Name/Age/Gender Ann Goodwin 78 y.o. female  Code Status    Code Status Orders  (From admission, onward)         Start     Ordered   03/21/18 0018  Full code  Continuous     03/21/18 0017        Code Status History    Date Active Date Inactive Code Status Order ID Comments User Context   08/26/2016 0815 08/30/2016 2126 Full Code [id number]  Flossie Dibble, MD ED   11/02/2015 2206 11/05/2015 1803 Full Code [id number]  Vianne Bulls, MD ED   06/01/2013 1938 06/02/2013 1903 Full Code [id number]  Olam Idler, MD Inpatient      Home/SNF/Other Home  Chief Complaint Fall  Level of Care/Admitting Diagnosis ED Disposition    ED Disposition Condition [place]: [facility] [[id number]]  Level of Care: Telemetry [5]  Admit to tele based on following criteria: Monitor QTC interval  Diagnosis: Pulmonary contusion [585277]  Admitting Physician: Rise Patience [phone]  Attending Physician: Rise Patience [phone]  PT Class (Do Not Modify): Observation [104]  PT Acc Code (Do Not Modify): Observation [10022]       Medical History Past Medical History:  Diagnosis Date  . Alcohol abuse   . Chronic edema   . Dilated aortic root ([place]) 08/28/2016  . Hyperlipidemia   . Hypertension   . Hypothyroid   . Infestation by bed bug   . Tobacco abuse   . Venous stasis     Allergies No Known Allergies  IV Location/Drains/Wounds Patient Lines/Drains/Airways Status   Active Line/Drains/Airways    Name:   Placement date:   Placement time:   Site:   Days:   Peripheral IV 03/20/18 Left Forearm   03/20/18    2118    Forearm   1   External Urinary Catheter   08/27/16    1031    -   571   Wound / Incision (Open or Dehisced) 11/02/15 Leg Left;Lateral   11/02/15    2327    Leg   870          Labs/Imaging Results for orders placed or performed during the hospital encounter of 03/20/18 (from the past 48  hour(s))  CBC with Differential     Status: Abnormal   Collection Time: 03/20/18  5:54 PM  Result Value Ref Range   WBC 9.2 4.0 - 10.5 K/uL   RBC 3.58 (L) 3.87 - 5.11 MIL/uL   Hemoglobin 11.1 (L) 12.0 - 15.0 g/dL   HCT 34.7 (L) 36.0 - 46.0 %   MCV 96.9 80.0 - 100.0 fL   MCH 31.0 26.0 - 34.0 pg   MCHC 32.0 30.0 - 36.0 g/dL   RDW 13.6 11.5 - 15.5 %   Platelets 268 150 - 400 K/uL   nRBC 0.0 0.0 - 0.2 %   Neutrophils Relative % 65 %   Neutro Abs 6.0 1.7 - 7.7 K/uL   Lymphocytes Relative 19 %   Lymphs Abs 1.7 0.7 - 4.0 K/uL   Monocytes Relative 13 %   Monocytes Absolute 1.2 (H) 0.1 - 1.0 K/uL   Eosinophils Relative 2 %   Eosinophils Absolute 0.2 0.0 - 0.5 K/uL   Basophils Relative 0 %   Basophils Absolute 0.0 0.0 - 0.1 K/uL   Immature Granulocytes 1 %   Abs Immature Granulocytes 0.06 0.00 - 0.07 K/uL  Comment: Performed at [facility], [place] [address] [place], [place] [address]  Comprehensive metabolic panel     Status: Abnormal   Collection Time: 03/20/18  5:54 PM  Result Value Ref Range   Sodium 136 135 - 145 mmol/L   Potassium 3.2 (L) 3.5 - 5.1 mmol/L   Chloride 102 98 - 111 mmol/L   CO2 27 22 - 32 mmol/L   Glucose, Bld 99 70 - 99 mg/dL   BUN 8 8 - 23 mg/dL   Creatinine, Ser 0.59 0.44 - 1.00 mg/dL   Calcium 8.5 (L) 8.9 - 10.3 mg/dL   Total Protein 8.5 (H) 6.5 - 8.1 g/dL   Albumin 2.9 (L) 3.5 - 5.0 g/dL   AST 19 15 - 41 U/L   ALT 11 0 - 44 U/L   Alkaline Phosphatase 69 38 - 126 U/L   Total Bilirubin 0.8 0.3 - 1.2 mg/dL   GFR calc non Af Amer >60 >60 mL/min   GFR calc Af Amer >60 >60 mL/min   Anion gap 7 5 - 15    Comment: Performed at [facility], [place] [address] [place], [place] [address]  Urinalysis, Routine w reflex microscopic     Status: Abnormal   Collection Time: 03/20/18  6:23 PM  Result Value Ref Range   Color, Urine STRAW (A) YELLOW   APPearance CLEAR CLEAR   Specific Gravity, Urine 1.002 (L) 1.005 - 1.030   pH 7.0  5.0 - 8.0   Glucose, UA NEGATIVE NEGATIVE mg/dL   Hgb urine dipstick NEGATIVE NEGATIVE   Bilirubin Urine NEGATIVE NEGATIVE   Ketones, ur NEGATIVE NEGATIVE mg/dL   Protein, ur NEGATIVE NEGATIVE mg/dL   Nitrite NEGATIVE NEGATIVE   Leukocytes, UA NEGATIVE NEGATIVE    Comment: Performed at [place] [address] [place], [place] [address]  CBC     Status: Abnormal   Collection Time: 03/21/18  6:41 AM  Result Value Ref Range   WBC 7.6 4.0 - 10.5 K/uL   RBC 3.86 (L) 3.87 - 5.11 MIL/uL   Hemoglobin 11.8 (L) 12.0 - 15.0 g/dL   HCT 37.7 36.0 - 46.0 %   MCV 97.7 80.0 - 100.0 fL   MCH 30.6 26.0 - 34.0 pg   MCHC 31.3 30.0 - 36.0 g/dL   RDW 13.8 11.5 - 15.5 %   Platelets 250 150 - 400 K/uL   nRBC 0.0 0.0 - 0.2 %    Comment: Performed at [facility], [place] [address] [place], [place] [address]  TSH     Status: Abnormal   Collection Time: 03/21/18  6:41 AM  Result Value Ref Range   TSH 5.045 (H) 0.350 - 4.500 uIU/mL    Comment: Performed by a 3rd Generation assay with a functional sensitivity of <=0.01 uIU/mL. Performed at [facility], [place] [address] [place], [place] [address]   Reticulocytes     Status: None   Collection Time: 03/21/18  6:41 AM  Result Value Ref Range   Retic Ct Pct 1.0 0.4 - 3.1 %   RBC. 3.94 3.87 - 5.11 MIL/uL   Retic Count, Absolute 39.4 19.0 - 186.0 K/uL   Immature Retic Fract 6.2 2.3 - 15.9 %    Comment: Performed at [facility], [place] [address], [place], [place] [address]  Vitamin B12     Status: None   Collection Time: 03/21/18  7:01 AM  Result Value Ref Range  Vitamin B-12 243 180 - 914 pg/mL    Comment: (NOTE) This assay is not validated for testing neonatal or myeloproliferative syndrome specimens for Vitamin B12 levels. Performed at [facility], [place] [address], [place], [place] [address]   Basic metabolic panel     Status: Abnormal   Collection Time:  03/21/18  7:01 AM  Result Value Ref Range   Sodium 140 135 - 145 mmol/L   Potassium 3.7 3.5 - 5.1 mmol/L   Chloride 112 (H) 98 - 111 mmol/L   CO2 24 22 - 32 mmol/L   Glucose, Bld 88 70 - 99 mg/dL   BUN 7 (L) 8 - 23 mg/dL   Creatinine, Ser 0.47 0.44 - 1.00 mg/dL   Calcium 7.6 (L) 8.9 - 10.3 mg/dL   GFR calc non Af Amer >60 >60 mL/min   GFR calc Af Amer >60 >60 mL/min   Anion gap 4 (L) 5 - 15    Comment: Performed at [facility], [place] [address], [place], [place] [address]  Iron and TIBC     Status: None   Collection Time: 03/21/18  7:01 AM  Result Value Ref Range   Iron 38 28 - 170 ug/dL   TIBC 254 250 - 450 ug/dL   Saturation Ratios 15 10.4 - 31.8 %   UIBC 216 ug/dL    Comment: Performed at [facility], [place] [address], [place], [place] [address]  Ferritin     Status: None   Collection Time: 03/21/18  7:01 AM  Result Value Ref Range   Ferritin 105 11 - 307 ng/mL    Comment: Performed at [facility], [place] [address] [place], [place] [address]  Folate     Status: None   Collection Time: 03/21/18  7:01 AM  Result Value Ref Range   Folate 11.2 >5.9 ng/mL    Comment: Performed at [facility], [place] [address] [place], [place] [address]  Comprehensive metabolic panel     Status: Abnormal   Collection Time: 03/21/18 10:42 AM  Result Value Ref Range   Sodium 140 135 - 145 mmol/L   Potassium 3.7 3.5 - 5.1 mmol/L   Chloride 105 98 - 111 mmol/L   CO2 29 22 - 32 mmol/L   Glucose, Bld 123 (H) 70 - 99 mg/dL   BUN 9 8 - 23 mg/dL   Creatinine, Ser 0.58 0.44 - 1.00 mg/dL   Calcium 8.5 (L) 8.9 - 10.3 mg/dL   Total Protein 8.2 (H) 6.5 - 8.1 g/dL   Albumin 2.7 (L) 3.5 - 5.0 g/dL   AST 26 15 - 41 U/L   ALT 12 0 - 44 U/L   Alkaline Phosphatase 66 38 - 126 U/L   Total Bilirubin 0.8 0.3 - 1.2 mg/dL   GFR calc non Af Amer >60 >60 mL/min   GFR calc Af Amer >60 >60 mL/min   Anion gap 6 5 - 15    Comment: Performed  at [facility], [place] [address] [place], [place] [address]   Dg Ribs Unilateral W/chest Right  Result Date: 03/20/2018 CLINICAL DATA:  Acute chest and RIGHT rib pain following fall today. EXAM: RIGHT RIBS AND CHEST - 3+ VIEW COMPARISON:  08/25/2016 and prior radiographs FINDINGS: UPPER limits normal heart size and mild LEFT basilar scarring again noted. There is no evidence of focal airspace disease, pulmonary edema, suspicious pulmonary nodule/mass, pleural effusion, or pneumothorax. A nondisplaced fracture of the  anterior RIGHT 10th rib is present. IMPRESSION: Nondisplaced anterior RIGHT 10th rib fracture. No pleural effusion or pneumothorax. Electronically Signed   By: Margarette Canada M.D.   On: 03/20/2018 15:39   Dg Pelvis 1-2 Views  Result Date: 03/21/2018 CLINICAL DATA:  Trauma secondary to a fall at home yesterday. EXAM: PELVIS - 1-2 VIEW COMPARISON:  CT scan dated 03/20/2018 FINDINGS: There is no evidence of pelvic fracture or diastasis. There are slight arthritic changes of both hips. Bowel gas pattern is normal. IMPRESSION: No acute abnormalities of the pelvis. Electronically Signed   By: Lorriane Shire M.D.   On: 03/21/2018 08:09   Ct Head Wo Contrast  Result Date: 03/21/2018 CLINICAL DATA:  Altered level of consciousness, history of alcohol abuse, hypertension, smoker EXAM: CT HEAD WITHOUT CONTRAST TECHNIQUE: Contiguous axial images were obtained from the base of the skull through the vertex without intravenous contrast. Sagittal and coronal MPR images reconstructed from axial data set. COMPARISON:  06/10/2015 FINDINGS: Brain: Generalized atrophy. Normal ventricular morphology. No midline shift or mass effect. Probable small vessel chronic ischemic changes of deep cerebral white matter. No intracranial hemorrhage, mass lesion, evidence of acute infarction, or extra-axial fluid collection. Vascular: Atherosclerotic calcifications of internal carotid arteries at skull base  Skull: Intact Sinuses/Orbits: Frontal sinus osteoma unchanged. Paranasal sinuses and mastoid air cells otherwise clear. Other: N/A IMPRESSION: Atrophy with probable small vessel chronic ischemic changes of deep cerebral white matter. No acute intracranial abnormalities. Electronically Signed   By: Lavonia Dana M.D.   On: 03/21/2018 08:00   Ct Chest W Contrast  Result Date: 03/20/2018 CLINICAL DATA:  Right-sided rib pain after mechanical fall. EXAM: CT CHEST, ABDOMEN, AND PELVIS WITH CONTRAST TECHNIQUE: Multidetector CT imaging of the chest, abdomen and pelvis was performed following the standard protocol during bolus administration of intravenous contrast. CONTRAST:  124mL ISOVUE-300 IOPAMIDOL (ISOVUE-300) INJECTION 61% COMPARISON:  CT abdomen and pelvis 08/28/2016. MRI abdomen 08/30/2016. FINDINGS: CT CHEST FINDINGS Cardiovascular: Normal caliber thoracic aorta. Scattered aortic calcifications. Great vessel origins are patent. Mild cardiac enlargement. No significant pericardial effusion. Coronary artery calcifications. Mediastinum/Nodes: Esophagus is decompressed. No significant lymphadenopathy in the chest. Lungs/Pleura: Scattered emphysematous changes in the lungs. Mild dependent atelectasis. Focal area consolidation in the right lung base could represent pneumonia possibly aspiration or contusion. No pleural effusions. No pneumothorax. Musculoskeletal: Degenerative changes in the spine. No vertebral compression deformities. Normal alignment. Sternum appears intact. Minimally displaced fracture of the right anterolateral tenth rib. CT ABDOMEN PELVIS FINDINGS Hepatobiliary: Large poorly defined hypoenhancing mass replacing the gallbladder and with infiltration into the adjacent liver involving segments 4 and 5. There is infiltration in the adjacent fat suggesting local spread. 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 Fat planes with the adjacent duodenum and splenic flexure of the colon or non distinct suggesting possible direct invasion. No bile duct dilatation. Pancreas: Unremarkable. No pancreatic ductal dilatation or surrounding inflammatory changes. Spleen: Normal in size without focal abnormality. Adrenals/Urinary Tract: Adrenal glands are unremarkable. Kidneys are normal, without renal calculi, focal lesion, or hydronephrosis. Bladder is unremarkable. Stomach/Bowel: Stomach, small bowel, and colon are mostly decompressed. Colonic diverticula without evidence of diverticulitis. Appendix is not identified. Vascular/Lymphatic: Calcification of the aorta. No aneurysm. No significant lymphadenopathy. Reproductive: Surgical absence of the gallbladder. Other: Free fluid in the pelvis may be reactive or malignant. No free air. Abdominal wall musculature appears intact. Musculoskeletal: Degenerative changes in the spine  and hips. Schmorl's node at L1. No destructive bone lesions. No acute fractures are identified. IMPRESSION: 1. Minimally displaced fracture of the right anterolateral tenth rib. 2. Focal consolidation in the right lung base could represent pneumonia, contusion, or aspiration. 3. 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 4. Free fluid in the pelvis may be reactive or malignant. 5. Colonic diverticulosis without evidence of diverticulitis. Aortic Atherosclerosis (ICD10-I70.0). Electronically Signed   By: Lucienne Capers M.D.   On: 03/20/2018 22:11   Ct Abdomen Pelvis W Contrast  Result Date: 03/20/2018 CLINICAL DATA:  Right-sided rib pain after mechanical fall. EXAM: CT CHEST, ABDOMEN, AND PELVIS WITH CONTRAST TECHNIQUE: Multidetector CT imaging of the chest, abdomen and pelvis was performed following the standard protocol during bolus  administration of intravenous contrast. CONTRAST:  115mL ISOVUE-300 IOPAMIDOL (ISOVUE-300) INJECTION 61% COMPARISON:  CT abdomen and pelvis 08/28/2016. MRI abdomen 08/30/2016. FINDINGS: CT CHEST FINDINGS Cardiovascular: Normal caliber thoracic aorta. Scattered aortic calcifications. Great vessel origins are patent. Mild cardiac enlargement. No significant pericardial effusion. Coronary artery calcifications. Mediastinum/Nodes: Esophagus is decompressed. No significant lymphadenopathy in the chest. Lungs/Pleura: Scattered emphysematous changes in the lungs. Mild dependent atelectasis. Focal area consolidation in the right lung base could represent pneumonia possibly aspiration or contusion. No pleural effusions. No pneumothorax. Musculoskeletal: Degenerative changes in the spine. No vertebral compression deformities. Normal alignment. Sternum appears intact. Minimally displaced fracture of the right anterolateral tenth rib. CT ABDOMEN PELVIS FINDINGS Hepatobiliary: Large poorly defined hypoenhancing mass replacing the gallbladder and with infiltration into the adjacent liver involving segments 4 and 5. There is infiltration in the adjacent fat suggesting local spread. 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 Fat planes with the adjacent duodenum and splenic flexure of the colon or non distinct suggesting possible direct invasion. No bile duct dilatation. Pancreas: Unremarkable. No pancreatic ductal dilatation or surrounding inflammatory changes. Spleen: Normal in size without focal abnormality. Adrenals/Urinary Tract: Adrenal glands are unremarkable. Kidneys are normal, without renal calculi, focal lesion, or hydronephrosis. Bladder is unremarkable. Stomach/Bowel: Stomach, small bowel, and colon are mostly decompressed. Colonic diverticula without evidence of diverticulitis. Appendix is not identified.  Vascular/Lymphatic: Calcification of the aorta. No aneurysm. No significant lymphadenopathy. Reproductive: Surgical absence of the gallbladder. Other: Free fluid in the pelvis may be reactive or malignant. No free air. Abdominal wall musculature appears intact. Musculoskeletal: Degenerative changes in the spine and hips. Schmorl's node at L1. No destructive bone lesions. No acute fractures are identified. IMPRESSION: 1. Minimally displaced fracture of the right anterolateral tenth rib. 2. Focal consolidation in the right lung base could represent pneumonia, contusion, or aspiration. 3. 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 4. Free fluid in the pelvis may be reactive or malignant. 5. Colonic diverticulosis without evidence of diverticulitis. Aortic Atherosclerosis (ICD10-I70.0). Electronically Signed   By: Lucienne Capers M.D.   On: 03/20/2018 22:11    Pending Labs Unresulted Labs (From admission, onward)    Start     Ordered   03/21/18 1008  Cancer antigen 19-9  Once,   R     03/21/18 1007   03/21/18 1008  CEA  Once,   R     03/21/18 1007  Vitals/Pain Today's Vitals   03/21/18 1125 03/21/18 1300 03/21/18 1400 03/21/18 1404  BP: 140/73 (!) 142/67 (!) 149/77 (!) 149/77  Pulse: 61  69 75  Resp: (!) 23 20 (!) 22 (!) 21  Temp:      TempSrc:      SpO2: 99%   99%  PainSc:        Isolation Precautions No active isolations  Medications Medications  lidocaine (LIDODERM) 5 % 1 patch (1 patch Transdermal Patch Removed 03/21/18 0628)  iopamidol (ISOVUE-300) 61 % injection (has no administration in time range)  sodium chloride (PF) 0.9 % injection (has no administration in time range)  acetaminophen (TYLENOL) tablet 650 mg (has no administration in time range)    Or  acetaminophen (TYLENOL) suppository 650 mg (has no administration  in time range)  ondansetron (ZOFRAN) tablet 4 mg (has no administration in time range)    Or  ondansetron (ZOFRAN) injection 4 mg (has no administration in time range)  thiamine (VITAMIN B-1) tablet 100 mg (100 mg Oral Given 03/21/18 0709)  acetaminophen (TYLENOL) tablet 1,000 mg (1,000 mg Oral Given 03/20/18 1805)  potassium chloride SA (K-DUR,KLOR-CON) CR tablet 40 mEq (40 mEq Oral Given 03/20/18 2004)  iopamidol (ISOVUE-300) 61 % injection 100 mL (100 mLs Intravenous Contrast Given 03/20/18 2131)    Mobility walks with person assist

## 2018-03-21 NOTE — Evaluation (Signed)
Physical Therapy Evaluation Patient Details Name: Ann Goodwin MRN: [id number] DOB: 1940/04/12 Today's Date: 03/21/2018   History of Present Illness  Pt s/p fall with pumonary contusion and R 10th rib fx.  Pt with hx of ETOH abuse and chronic edema  Clinical Impression  Pt admitted as above and presenting with functional mobility limitations 2* generalized weakness, balance deficits, pain with movement 2* rib fx, and limited endurance.  Pt would benefit from follow up rehab at SNF level to maximize IND and safety prior to return home.    Follow Up Recommendations SNF    Equipment Recommendations  Other (comment)(TBD - pt unclear on equipment at home)    Recommendations for Other Services       Precautions / Restrictions Precautions Precautions: Fall Restrictions Weight Bearing Restrictions: No      Mobility  Bed Mobility Overal bed mobility: Needs Assistance Bed Mobility: Supine to Sit;Sit to Supine     Supine to sit: +2 for physical assistance;+2 for safety/equipment;Min assist;Mod assist Sit to supine: +2 for physical assistance;+2 for safety/equipment;Min assist;Mod assist   General bed mobility comments: cues for sequence; Pt utilizing log roll out of bed  Transfers Overall transfer level: Needs assistance Equipment used: Rolling walker (2 wheeled) Transfers: Sit to/from [facility] Sit to Stand: Min assist;Mod assist;+2 safety/equipment;From elevated surface Stand pivot transfers: Min assist;Mod assist;+2 safety/equipment       General transfer comment: Increased time with cues for sequence and use of UEs to self assist.  Stand/pvt bed to [facility] - pt attempting to sit before safely in front of [facility]  Ambulation/Gait Ambulation/Gait assistance: Min assist;Mod assist;+2 safety/equipment Gait Distance (Feet): 4 Feet Assistive device: Rolling walker (2 wheeled) Gait Pattern/deviations: Step-to pattern;Decreased step length - right;Decreased step  length - left;Shuffle;Trunk flexed Gait velocity: decr   General Gait Details: cues for posture and position from RW.  Pt unsteady with shuffling gait and reluctant to mobilize further 2* pain/fatigue  Stairs            Wheelchair Mobility    Modified Rankin (Stroke Patients Only)       Balance Overall balance assessment: Needs assistance Sitting-balance support: Feet supported Sitting balance-Leahy Scale: Fair       Standing balance-Leahy Scale: Poor Standing balance comment: reliant on RW and PT assist for balance                             Pertinent Vitals/Pain Pain Assessment: Faces Faces Pain Scale: Hurts little more Pain Location: R ribs with movement or coughing Pain Descriptors / Indicators: Sore Pain Intervention(s): Limited activity within patient's tolerance;Monitored during session    Home Living Family/patient expects to be discharged to:: Private residence Living Arrangements: Children Available Help at Discharge: Available PRN/intermittently Type of Home: House Home Access: Stairs to enter Entrance Stairs-Rails: None Entrance Stairs-Number of Steps: 3 Home Layout: One level Home Equipment: None Additional Comments: Pt denies having equipment but previous hospital stay in 2018 records RW    Prior Function Level of Independence: Independent         Comments: Pt is questionable historian     Hand Dominance   Dominant Hand: Right    Extremity/Trunk Assessment   Upper Extremity Assessment Upper Extremity Assessment: Generalized weakness    Lower Extremity Assessment Lower Extremity Assessment: Generalized weakness       Communication   Communication: No difficulties  Cognition Arousal/Alertness: Awake/alert Behavior During Therapy: [facility] for  tasks assessed/performed Overall Cognitive Status: Within Functional Limits for tasks assessed                                        General Comments       Exercises     Assessment/Plan    PT Assessment Patient needs continued PT services  PT Problem List Decreased strength;Decreased activity tolerance;Decreased balance;Decreased mobility;Decreased cognition;Decreased knowledge of use of DME;Decreased safety awareness;Pain       PT Treatment Interventions DME instruction;Gait training;Stair training;Functional mobility training;Therapeutic activities;Therapeutic exercise;Balance training;Patient/family education    PT Goals (Current goals can be found in the Care Plan section)  Acute Rehab PT Goals Patient Stated Goal: Less pain, back to bed PT Goal Formulation: With patient Time For Goal Achievement: 04/04/18 Potential to Achieve Goals: Fair    Frequency Min 3X/week   Barriers to discharge        Co-evaluation               AM-PAC PT "6 Clicks" Mobility  Outcome Measure Help needed turning from your back to your side while in a flat bed without using bedrails?: A Lot Help needed moving from lying on your back to sitting on the side of a flat bed without using bedrails?: A Lot Help needed moving to and from a bed to a chair (including a wheelchair)?: A Lot Help needed standing up from a chair using your arms (e.g., wheelchair or bedside chair)?: A Lot Help needed to walk in hospital room?: A Lot Help needed climbing 3-5 steps with a railing? : Total 6 Click Score: 11    End of Session Equipment Utilized During Treatment: Gait belt Activity Tolerance: Patient limited by fatigue;Patient limited by pain Patient left: in bed;with call bell/phone within reach Nurse Communication: Mobility status PT Visit Diagnosis: Unsteadiness on feet (R26.81);History of falling (Z91.81);Difficulty in walking, not elsewhere classified (R26.2);Muscle weakness (generalized) (M62.81)    Time: 0930-1004 PT Time Calculation (min) (ACUTE ONLY): 34 min   Charges:   PT Evaluation $PT Eval Low Complexity: 1 Low PT Treatments $Gait Training:  8-22 mins        [place] Pager [phone] Office [phone]   Krishang Reading 03/21/2018, 2:25 PM

## 2018-03-22 DIAGNOSIS — C23 Malignant neoplasm of gallbladder: Secondary | ICD-10-CM

## 2018-03-22 DIAGNOSIS — F1010 Alcohol abuse, uncomplicated: Secondary | ICD-10-CM | POA: Diagnosis present

## 2018-03-22 DIAGNOSIS — S2239XA Fracture of one rib, unspecified side, initial encounter for closed fracture: Secondary | ICD-10-CM

## 2018-03-22 LAB — CBC WITH DIFFERENTIAL/PLATELET
Abs Immature Granulocytes: 0.04 10*3/uL (ref 0.00–0.07)
BASOS PCT: 0 %
Basophils Absolute: 0 10*3/uL (ref 0.0–0.1)
EOS ABS: 0.2 10*3/uL (ref 0.0–0.5)
Eosinophils Relative: 2 %
HCT: 39.3 % (ref 36.0–46.0)
Hemoglobin: 12.5 g/dL (ref 12.0–15.0)
Immature Granulocytes: 1 %
Lymphocytes Relative: 16 %
Lymphs Abs: 1.4 10*3/uL (ref 0.7–4.0)
MCH: 30.2 pg (ref 26.0–34.0)
MCHC: 31.8 g/dL (ref 30.0–36.0)
MCV: 94.9 fL (ref 80.0–100.0)
Monocytes Absolute: 0.8 10*3/uL (ref 0.1–1.0)
Monocytes Relative: 10 %
NEUTROS PCT: 71 %
Neutro Abs: 5.9 10*3/uL (ref 1.7–7.7)
Platelets: 269 10*3/uL (ref 150–400)
RBC: 4.14 MIL/uL (ref 3.87–5.11)
RDW: 13.2 % (ref 11.5–15.5)
WBC: 8.3 10*3/uL (ref 4.0–10.5)
nRBC: 0 % (ref 0.0–0.2)

## 2018-03-22 LAB — COMPREHENSIVE METABOLIC PANEL
ALT: 12 U/L (ref 0–44)
AST: 21 U/L (ref 15–41)
Albumin: 2.8 g/dL — ABNORMAL LOW (ref 3.5–5.0)
Alkaline Phosphatase: 67 U/L (ref 38–126)
Anion gap: 8 (ref 5–15)
BUN: 6 mg/dL — ABNORMAL LOW (ref 8–23)
CO2: 26 mmol/L (ref 22–32)
Calcium: 8.5 mg/dL — ABNORMAL LOW (ref 8.9–10.3)
Chloride: 101 mmol/L (ref 98–111)
Creatinine, Ser: 0.47 mg/dL (ref 0.44–1.00)
GFR calc Af Amer: >60 mL/min (ref >60)
Glucose, Bld: 99 mg/dL (ref 70–99)
Potassium: 3.7 mmol/L (ref 3.5–5.1)
Sodium: 135 mmol/L (ref 135–145)
Total Bilirubin: 1.2 mg/dL (ref 0.3–1.2)
Total Protein: 8.3 g/dL — ABNORMAL HIGH (ref 6.5–8.1)

## 2018-03-22 LAB — LACTATE DEHYDROGENASE: LDH: 160 U/L (ref 98–192)

## 2018-03-22 LAB — CANCER ANTIGEN 19-9: CA 19-9: 489 U/mL — ABNORMAL HIGH (ref 0–35)

## 2018-03-22 LAB — CEA: CEA: 3.5 ng/mL (ref 0.0–4.7)

## 2018-03-22 NOTE — Consult Note (Signed)
Reason for Consult:gallbladder cancer Referring Physician: Dr. Terence Lux is an 78 y.o. female.  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 She was apparently non compliant with follow up. She now presents with RUQ pain. 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 Alcohol abuse   . Chronic edema   . Dilated aortic root ([place]) 08/28/2016  . Hyperlipidemia   . Hypertension   . Hypothyroid   . Infestation by bed bug   . Tobacco abuse   . Venous stasis     Past Surgical History:  Procedure Laterality Date  . ABDOMINAL HYSTERECTOMY    . BUNIONECTOMY      Family History  Problem Relation Age of Onset  . Stroke Mother   . Deep vein thrombosis Neg Hx   . Pulmonary embolism Neg Hx     Social History:  reports that she has been smoking cigarettes. She has been smoking about 0.50 packs per day. She has never used smokeless tobacco. She reports current alcohol use of about 2.0 standard drinks of alcohol per week. She reports that she does not use drugs.  Allergies: No Known Allergies  Medications: I have reviewed the patient's current medications.  Results for orders placed or performed during the hospital encounter of 03/20/18 (from the past 48 hour(s))  CBC with Differential     Status: Abnormal   Collection Time: 03/20/18  5:54 PM  Result Value Ref Range   WBC 9.2 4.0 - 10.5 K/uL   RBC 3.58 (L) 3.87 - 5.11 MIL/uL   Hemoglobin 11.1 (L) 12.0 - 15.0 g/dL   HCT 34.7 (L) 36.0 - 46.0 %   MCV 96.9 80.0 - 100.0 fL   MCH 31.0 26.0 - 34.0 pg   MCHC 32.0 30.0 - 36.0 g/dL   RDW 13.6 11.5 - 15.5 %   Platelets 268 150 - 400 K/uL   nRBC 0.0 0.0 - 0.2 %   Neutrophils Relative % 65 %   Neutro Abs 6.0 1.7 - 7.7 K/uL   Lymphocytes Relative 19 %   Lymphs Abs 1.7 0.7 - 4.0 K/uL   Monocytes Relative 13 %   Monocytes  Absolute 1.2 (H) 0.1 - 1.0 K/uL   Eosinophils Relative 2 %   Eosinophils Absolute 0.2 0.0 - 0.5 K/uL   Basophils Relative 0 %   Basophils Absolute 0.0 0.0 - 0.1 K/uL   Immature Granulocytes 1 %   Abs Immature Granulocytes 0.06 0.00 - 0.07 K/uL    Comment: Performed at [facility], [place] [address], [place], [place] [address]  Comprehensive metabolic panel     Status: Abnormal   Collection Time: 03/20/18  5:54 PM  Result Value Ref Range   Sodium 136 135 - 145 mmol/L   Potassium 3.2 (L) 3.5 - 5.1 mmol/L   Chloride 102 98 - 111 mmol/L   CO2 27 22 - 32 mmol/L   Glucose, Bld 99 70 - 99 mg/dL   BUN 8 8 - 23 mg/dL   Creatinine, Ser 0.59 0.44 - 1.00 mg/dL   Calcium 8.5 (L) 8.9 - 10.3 mg/dL   Total Protein 8.5 (H) 6.5 - 8.1 g/dL   Albumin 2.9 (L) 3.5 - 5.0 g/dL   AST 19 15 - 41 U/L   ALT 11 0 - 44 U/L   Alkaline Phosphatase  69 38 - 126 U/L   Total Bilirubin 0.8 0.3 - 1.2 mg/dL   GFR calc non Af Amer >60 >60 mL/min   GFR calc Af Amer >60 >60 mL/min   Anion gap 7 5 - 15    Comment: Performed at [facility], [place] [address] [place], [place] [address]  Urinalysis, Routine w reflex microscopic     Status: Abnormal   Collection Time: 03/20/18  6:23 PM  Result Value Ref Range   Color, Urine STRAW (A) YELLOW   APPearance CLEAR CLEAR   Specific Gravity, Urine 1.002 (L) 1.005 - 1.030   pH 7.0 5.0 - 8.0   Glucose, UA NEGATIVE NEGATIVE mg/dL   Hgb urine dipstick NEGATIVE NEGATIVE   Bilirubin Urine NEGATIVE NEGATIVE   Ketones, ur NEGATIVE NEGATIVE mg/dL   Protein, ur NEGATIVE NEGATIVE mg/dL   Nitrite NEGATIVE NEGATIVE   Leukocytes, UA NEGATIVE NEGATIVE    Comment: Performed at [place] [address], [place], [place] [address]  CBC     Status: Abnormal   Collection Time: 03/21/18  6:41 AM  Result Value Ref Range   WBC 7.6 4.0 - 10.5 K/uL   RBC 3.86 (L) 3.87 - 5.11 MIL/uL   Hemoglobin 11.8 (L) 12.0 - 15.0 g/dL   HCT 37.7 36.0 -  46.0 %   MCV 97.7 80.0 - 100.0 fL   MCH 30.6 26.0 - 34.0 pg   MCHC 31.3 30.0 - 36.0 g/dL   RDW 13.8 11.5 - 15.5 %   Platelets 250 150 - 400 K/uL   nRBC 0.0 0.0 - 0.2 %    Comment: Performed at [facility], [place] [address] [place], [place] [address]  TSH     Status: Abnormal   Collection Time: 03/21/18  6:41 AM  Result Value Ref Range   TSH 5.045 (H) 0.350 - 4.500 uIU/mL    Comment: Performed by a 3rd Generation assay with a functional sensitivity of <=0.01 uIU/mL. Performed at [facility], [place] [address], [place], [place] [address]   Reticulocytes     Status: None   Collection Time: 03/21/18  6:41 AM  Result Value Ref Range   Retic Ct Pct 1.0 0.4 - 3.1 %   RBC. 3.94 3.87 - 5.11 MIL/uL   Retic Count, Absolute 39.4 19.0 - 186.0 K/uL   Immature Retic Fract 6.2 2.3 - 15.9 %    Comment: Performed at [facility], [place] [address], [place], [place] [address]  Vitamin B12     Status: None   Collection Time: 03/21/18  7:01 AM  Result Value Ref Range   Vitamin B-12 243 180 - 914 pg/mL    Comment: (NOTE) This assay is not validated for testing neonatal or myeloproliferative syndrome specimens for Vitamin B12 levels. Performed at [facility], [place] [address], [place], [place] [address]   Basic metabolic panel     Status: Abnormal   Collection Time: 03/21/18  7:01 AM  Result Value Ref Range   Sodium 140 135 - 145 mmol/L   Potassium 3.7 3.5 - 5.1 mmol/L   Chloride 112 (H) 98 - 111 mmol/L   CO2 24 22 - 32 mmol/L   Glucose, Bld 88 70 - 99 mg/dL   BUN 7 (L) 8 - 23 mg/dL   Creatinine, Ser 0.47 0.44 - 1.00 mg/dL   Calcium 7.6 (L) 8.9 - 10.3 mg/dL   GFR calc non Af Amer >60 >60 mL/min  GFR calc Af Amer >60 >60 mL/min   Anion gap 4 (L) 5 - 15    Comment: Performed at [facility], [place] [address] [place], [place] [address]  Iron and TIBC     Status: None   Collection Time: 03/21/18  7:01 AM   Result Value Ref Range   Iron 38 28 - 170 ug/dL   TIBC 254 250 - 450 ug/dL   Saturation Ratios 15 10.4 - 31.8 %   UIBC 216 ug/dL    Comment: Performed at [facility], [place] [address], [place], [place] [address]  Ferritin     Status: None   Collection Time: 03/21/18  7:01 AM  Result Value Ref Range   Ferritin 105 11 - 307 ng/mL    Comment: Performed at [facility], [place] [address], [place], [place] [address]  Folate     Status: None   Collection Time: 03/21/18  7:01 AM  Result Value Ref Range   Folate 11.2 >5.9 ng/mL    Comment: Performed at [facility], [place] [address] [place], [place] [address]  Comprehensive metabolic panel     Status: Abnormal   Collection Time: 03/21/18 10:42 AM  Result Value Ref Range   Sodium 140 135 - 145 mmol/L   Potassium 3.7 3.5 - 5.1 mmol/L   Chloride 105 98 - 111 mmol/L   CO2 29 22 - 32 mmol/L   Glucose, Bld 123 (H) 70 - 99 mg/dL   BUN 9 8 - 23 mg/dL   Creatinine, Ser 0.58 0.44 - 1.00 mg/dL   Calcium 8.5 (L) 8.9 - 10.3 mg/dL   Total Protein 8.2 (H) 6.5 - 8.1 g/dL   Albumin 2.7 (L) 3.5 - 5.0 g/dL   AST 26 15 - 41 U/L   ALT 12 0 - 44 U/L   Alkaline Phosphatase 66 38 - 126 U/L   Total Bilirubin 0.8 0.3 - 1.2 mg/dL   GFR calc non Af Amer >60 >60 mL/min   GFR calc Af Amer >60 >60 mL/min   Anion gap 6 5 - 15    Comment: Performed at [facility], [place] [address], [place], [place] [address]  Cancer antigen 19-9     Status: Abnormal   Collection Time: 03/21/18 10:42 AM  Result Value Ref Range   CA 19-9 489 (H) 0 - 35 U/mL    Comment: (NOTE) Roche [employer] Electrochemiluminescence Immunoassay (ECLIA) Values obtained with different assay methods or kits cannot be used interchangeably.  Results cannot be interpreted as absolute evidence of the presence or absence of malignant disease. Performed At: [facility] [place], [place] [id number] Rush Farmer MD CB:[id number]   CEA     Status: None   Collection Time: 03/21/18 10:42 AM  Result Value Ref Range   CEA 3.5 0.0 - 4.7 ng/mL    Comment: (NOTE)                             Nonsmokers          <3.9                             Smokers             <5.6 [employer] Electrochemiluminescence Immunoassay (ECLIA) Values obtained with different assay methods or kits cannot be used interchangeably.  Results cannot be interpreted as absolute evidence of the presence or absence of malignant disease. Performed At: [facility] [place], [place] [id number] Rush Farmer MD IW:[id number]   Prealbumin     Status: Abnormal   Collection Time: 03/21/18  5:10 PM  Result Value Ref Range   Prealbumin 8.0 (L) 18 - 38 mg/dL    Comment: Performed at [facility], [place] [address] [place], [place] [address]  CBC with Differential/Platelet     Status: None   Collection Time: 03/22/18  5:19 AM  Result Value Ref Range   WBC 8.3 4.0 - 10.5 K/uL   RBC 4.14 3.87 - 5.11 MIL/uL   Hemoglobin 12.5 12.0 - 15.0 g/dL   HCT 39.3 36.0 - 46.0 %   MCV 94.9 80.0 - 100.0 fL   MCH 30.2 26.0 - 34.0 pg   MCHC 31.8 30.0 - 36.0 g/dL   RDW 13.2 11.5 - 15.5 %   Platelets 269 150 - 400 K/uL   nRBC 0.0 0.0 - 0.2 %   Neutrophils Relative % 71 %   Neutro Abs 5.9 1.7 - 7.7 K/uL   Lymphocytes Relative 16 %   Lymphs Abs 1.4 0.7 - 4.0 K/uL   Monocytes Relative 10 %   Monocytes Absolute 0.8 0.1 - 1.0 K/uL   Eosinophils Relative 2 %   Eosinophils Absolute 0.2 0.0 - 0.5 K/uL   Basophils Relative 0 %   Basophils Absolute 0.0 0.0 - 0.1 K/uL   Immature Granulocytes 1 %   Abs Immature Granulocytes 0.04 0.00 - 0.07 K/uL    Comment: Performed at [facility], [place] [address], [place], [place] [address]  Comprehensive metabolic panel     Status: Abnormal   Collection Time: 03/22/18  5:19 AM  Result Value Ref Range   Sodium 135 135 - 145 mmol/L   Potassium 3.7  3.5 - 5.1 mmol/L   Chloride 101 98 - 111 mmol/L   CO2 26 22 - 32 mmol/L   Glucose, Bld 99 70 - 99 mg/dL   BUN 6 (L) 8 - 23 mg/dL   Creatinine, Ser 0.47 0.44 - 1.00 mg/dL   Calcium 8.5 (L) 8.9 - 10.3 mg/dL   Total Protein 8.3 (H) 6.5 - 8.1 g/dL   Albumin 2.8 (L) 3.5 - 5.0 g/dL   AST 21 15 - 41 U/L   ALT 12 0 - 44 U/L   Alkaline Phosphatase 67 38 - 126 U/L   Total Bilirubin 1.2 0.3 - 1.2 mg/dL   GFR calc non Af Amer >60 >60 mL/min   GFR calc Af Amer >60 >60 mL/min   Anion gap 8 5 - 15    Comment: Performed at [facility], [place] [address], [place], [place] [address]  Lactate dehydrogenase     Status: None   Collection Time: 03/22/18  5:19 AM  Result Value Ref Range   LDH 160 98 - 192 U/L    Comment: Performed at [facility], [place] [address] [place], [place] [address]    Dg Ribs Unilateral W/chest Right  Result Date: 03/20/2018 CLINICAL DATA:  Acute chest and RIGHT rib pain following fall today. EXAM: RIGHT RIBS AND CHEST - 3+ VIEW COMPARISON:  08/25/2016 and prior radiographs FINDINGS: UPPER limits normal heart size and mild LEFT basilar scarring again noted. There is no evidence of focal airspace disease, pulmonary edema, suspicious pulmonary nodule/mass, pleural effusion, or pneumothorax. A nondisplaced fracture of the anterior  RIGHT 10th rib is present. IMPRESSION: Nondisplaced anterior RIGHT 10th rib fracture. No pleural effusion or pneumothorax. Electronically Signed   By: Margarette Canada M.D.   On: 03/20/2018 15:39   Dg Pelvis 1-2 Views  Result Date: 03/21/2018 CLINICAL DATA:  Trauma secondary to a fall at home yesterday. EXAM: PELVIS - 1-2 VIEW COMPARISON:  CT scan dated 03/20/2018 FINDINGS: There is no evidence of pelvic fracture or diastasis. There are slight arthritic changes of both hips. Bowel gas pattern is normal. IMPRESSION: No acute abnormalities of the pelvis. Electronically Signed   By: Lorriane Shire M.D.   On: 03/21/2018 08:09   Ct  Head Wo Contrast  Result Date: 03/21/2018 CLINICAL DATA:  Altered level of consciousness, history of alcohol abuse, hypertension, smoker EXAM: CT HEAD WITHOUT CONTRAST TECHNIQUE: Contiguous axial images were obtained from the base of the skull through the vertex without intravenous contrast. Sagittal and coronal MPR images reconstructed from axial data set. COMPARISON:  06/10/2015 FINDINGS: Brain: Generalized atrophy. Normal ventricular morphology. No midline shift or mass effect. Probable small vessel chronic ischemic changes of deep cerebral white matter. No intracranial hemorrhage, mass lesion, evidence of acute infarction, or extra-axial fluid collection. Vascular: Atherosclerotic calcifications of internal carotid arteries at skull base Skull: Intact Sinuses/Orbits: Frontal sinus osteoma unchanged. Paranasal sinuses and mastoid air cells otherwise clear. Other: N/A IMPRESSION: Atrophy with probable small vessel chronic ischemic changes of deep cerebral white matter. No acute intracranial abnormalities. Electronically Signed   By: Lavonia Dana M.D.   On: 03/21/2018 08:00   Ct Chest W Contrast  Result Date: 03/20/2018 CLINICAL DATA:  Right-sided rib pain after mechanical fall. EXAM: CT CHEST, ABDOMEN, AND PELVIS WITH CONTRAST TECHNIQUE: Multidetector CT imaging of the chest, abdomen and pelvis was performed following the standard protocol during bolus administration of intravenous contrast. CONTRAST:  175mL ISOVUE-300 IOPAMIDOL (ISOVUE-300) INJECTION 61% COMPARISON:  CT abdomen and pelvis 08/28/2016. MRI abdomen 08/30/2016. FINDINGS: CT CHEST FINDINGS Cardiovascular: Normal caliber thoracic aorta. Scattered aortic calcifications. Great vessel origins are patent. Mild cardiac enlargement. No significant pericardial effusion. Coronary artery calcifications. Mediastinum/Nodes: Esophagus is decompressed. No significant lymphadenopathy in the chest. Lungs/Pleura: Scattered emphysematous changes in the lungs. Mild  dependent atelectasis. Focal area consolidation in the right lung base could represent pneumonia possibly aspiration or contusion. No pleural effusions. No pneumothorax. Musculoskeletal: Degenerative changes in the spine. No vertebral compression deformities. Normal alignment. Sternum appears intact. Minimally displaced fracture of the right anterolateral tenth rib. CT ABDOMEN PELVIS FINDINGS Hepatobiliary: Large poorly defined hypoenhancing mass replacing the gallbladder and with infiltration into the adjacent liver involving segments 4 and 5. There is infiltration in the adjacent fat suggesting local spread. 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 Fat planes with the adjacent duodenum and splenic flexure of the colon or non distinct suggesting possible direct invasion. No bile duct dilatation. Pancreas: Unremarkable. No pancreatic ductal dilatation or surrounding inflammatory changes. Spleen: Normal in size without focal abnormality. Adrenals/Urinary Tract: Adrenal glands are unremarkable. Kidneys are normal, without renal calculi, focal lesion, or hydronephrosis. Bladder is unremarkable. Stomach/Bowel: Stomach, small bowel, and colon are mostly decompressed. Colonic diverticula without evidence of diverticulitis. Appendix is not identified. Vascular/Lymphatic: Calcification of the aorta. No aneurysm. No significant lymphadenopathy. Reproductive: Surgical absence of the gallbladder. Other: Free fluid in the pelvis may be reactive or malignant. No free air. Abdominal wall musculature appears intact. Musculoskeletal: Degenerative changes in the spine and  hips. Schmorl's node at L1. No destructive bone lesions. No acute fractures are identified. IMPRESSION: 1. Minimally displaced fracture of the right anterolateral tenth rib. 2. Focal consolidation in the right lung base could represent pneumonia, contusion, or  aspiration. 3. 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 4. Free fluid in the pelvis may be reactive or malignant. 5. Colonic diverticulosis without evidence of diverticulitis. Aortic Atherosclerosis (ICD10-I70.0). Electronically Signed   By: Lucienne Capers M.D.   On: 03/20/2018 22:11   Ct Abdomen Pelvis W Contrast  Result Date: 03/20/2018 CLINICAL DATA:  Right-sided rib pain after mechanical fall. EXAM: CT CHEST, ABDOMEN, AND PELVIS WITH CONTRAST TECHNIQUE: Multidetector CT imaging of the chest, abdomen and pelvis was performed following the standard protocol during bolus administration of intravenous contrast. CONTRAST:  171mL ISOVUE-300 IOPAMIDOL (ISOVUE-300) INJECTION 61% COMPARISON:  CT abdomen and pelvis 08/28/2016. MRI abdomen 08/30/2016. FINDINGS: CT CHEST FINDINGS Cardiovascular: Normal caliber thoracic aorta. Scattered aortic calcifications. Great vessel origins are patent. Mild cardiac enlargement. No significant pericardial effusion. Coronary artery calcifications. Mediastinum/Nodes: Esophagus is decompressed. No significant lymphadenopathy in the chest. Lungs/Pleura: Scattered emphysematous changes in the lungs. Mild dependent atelectasis. Focal area consolidation in the right lung base could represent pneumonia possibly aspiration or contusion. No pleural effusions. No pneumothorax. Musculoskeletal: Degenerative changes in the spine. No vertebral compression deformities. Normal alignment. Sternum appears intact. Minimally displaced fracture of the right anterolateral tenth rib. CT ABDOMEN PELVIS FINDINGS Hepatobiliary: Large poorly defined hypoenhancing mass replacing the gallbladder and with infiltration into the adjacent liver involving segments 4 and 5. There is infiltration in the adjacent fat suggesting local spread. The mass measures about  6.6 x 9.3 cm and represents significant progression of the patient's gallbladder carcinoma as seen on the previous studies. Fat planes with the adjacent duodenum and splenic flexure of the colon or non distinct suggesting possible direct invasion. No bile duct dilatation. Pancreas: Unremarkable. No pancreatic ductal dilatation or surrounding inflammatory changes. Spleen: Normal in size without focal abnormality. Adrenals/Urinary Tract: Adrenal glands are unremarkable. Kidneys are normal, without renal calculi, focal lesion, or hydronephrosis. Bladder is unremarkable. Stomach/Bowel: Stomach, small bowel, and colon are mostly decompressed. Colonic diverticula without evidence of diverticulitis. Appendix is not identified. Vascular/Lymphatic: Calcification of the aorta. No aneurysm. No significant lymphadenopathy. Reproductive: Surgical absence of the gallbladder. Other: Free fluid in the pelvis may be reactive or malignant. No free air. Abdominal wall musculature appears intact. Musculoskeletal: Degenerative changes in the spine and hips. Schmorl's node at L1. No destructive bone lesions. No acute fractures are identified. IMPRESSION: 1. Minimally displaced fracture of the right anterolateral tenth rib. 2. Focal consolidation in the right lung base could represent pneumonia, contusion, or aspiration. 3. 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 4. Free fluid in the pelvis may be reactive or malignant. 5. Colonic diverticulosis without evidence of diverticulitis. Aortic Atherosclerosis (ICD10-I70.0). Electronically Signed   By: Lucienne Capers M.D.   On: 03/20/2018 22:11   Mr Liver W Wo Contrast  Result Date: 03/22/2018 CLINICAL DATA:  Gallbladder mass, evaluate liver EXAM: MRI ABDOMEN WITHOUT AND WITH CONTRAST TECHNIQUE: Multiplanar multisequence MR imaging of the  abdomen was performed both before and after the administration of intravenous contrast. CONTRAST:  6 mL Gadovist IV COMPARISON:  CT abdomen/pelvis dated 03/20/2018. MRI abdomen dated 08/30/2016. CT abdomen/pelvis  dated 08/28/2016. FINDINGS: Motion degraded images. Lower chest: Small right and trace left pleural effusions. Hepatobiliary: Progressive gallbladder mass (worrisome for adenocarcinoma), now measuring 7.7 x 11.6 x 10.6, increased from 2018. Mass abuts liver and direct extension is difficult to exclude. Liver is otherwise within normal limits. No discrete/separate metastases. No intrahepatic or extrahepatic ductal dilatation. Pancreas:  Within normal limits. Spleen:  Within normal limits. Adrenals/Urinary Tract:  Adrenal glands are within normal limits. Kidneys are within normal limits.  No hydronephrosis. Stomach/Bowel: Stomach is notable for a tiny hiatal hernia. Visualized bowel is unremarkable. Vascular/Lymphatic:  No evidence of aneurysm. 16 mm short axis node in the porta hepatis (series 6/image 31), suspicious. Other:  No abdominal ascites. Musculoskeletal: No focal osseous lesions. IMPRESSION: Motion degraded images. 11.6 cm gallbladder mass, progressive from 2018, worrisome for adenocarcinoma. Mass abuts the liver and direct extension is difficult to exclude. 16 cm short axis node in the porta hepatis, suspicious for nodal metastasis. Electronically Signed   By: Julian Hy M.D.   On: 03/22/2018 04:29    Review of Systems  Constitutional: Positive for malaise/fatigue and weight loss.  HENT: Negative.   Eyes: Negative.   Respiratory: Negative.   Cardiovascular: Negative.   Gastrointestinal: Positive for abdominal pain.  Genitourinary: Negative.   Musculoskeletal: Negative.   Skin: Negative.   Neurological: Negative.   Endo/Heme/Allergies: Negative.   Psychiatric/Behavioral: Positive for substance abuse.   Blood pressure (!) 142/77, pulse (!) 58, temperature 99.3 F (37.4 C),  temperature source Oral, resp. rate 14, height 5\' 6"  (1.676 m), weight 65.8 kg, SpO2 100 %. Physical Exam  Constitutional: She is oriented to person, place, and time.  Slightly cachectic elderly bf in nad  HENT:  Head: Normocephalic and atraumatic.  Mouth/Throat: No oropharyngeal exudate.  Eyes: Pupils are equal, round, and reactive to light. Conjunctivae and EOM are normal.  Neck: Normal range of motion. Neck supple.  Cardiovascular: Normal rate, regular rhythm and normal heart sounds.  Respiratory: Effort normal and breath sounds normal. No stridor. No respiratory distress.  GI: Soft. Bowel sounds are normal. There is abdominal tenderness.  There is tenderness in RUQ  Musculoskeletal: Normal range of motion.        General: No tenderness or edema.  Neurological: She is alert and oriented to person, place, and time. Coordination normal.  Skin: Skin is warm and dry. No rash noted.  Psychiatric: She has a normal mood and affect. Her behavior is normal. Thought content normal.    Assessment/Plan: The patient now has locally advanced gallbladder cancer involving multiple nearby organs. At this point I do not think this is resectable. I will ask our surgical oncologist to give her opinion next week. She may benefit from evaluation at a tertiary center like [facility] or [place] [facility] 03/22/2018, 8:48 AM

## 2018-03-22 NOTE — Progress Notes (Signed)
PROGRESS NOTE    Ann Goodwin  OFB:[id number] DOB: 06-01-1940 DOA: 03/20/2018 PCP: Benito Mccreedy, MD     Brief Narrative:  Ann Offner Johnsonis a 78 y.o.femalewithhistory of alcohol abuse comes in for a fall.  She underwent CT chest and was found to have  Right 10 th rib fracture and possible contusion.  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   Assessment & Plan:   Active Problems:   Right pulmonary contusion   Closed fracture of one rib of right side   Normochromic normocytic anemia   Pulmonary contusion   Alcohol abuse   Minimally displaced fracture of the right anterolateral tenth rib   Gallbladder cancer (HCC)  Multiple falls with right Pulmonary contusion and right rib fractures: Continue with pulm toilet and incentive spirometer.  PT eval recommended.    Progression of GB carcinoma: Oncology consulted and MRI of the liver ordered and independently reviewed.  IR consult for biopsy.  Surgery consulted to see if the patient is a candidate for resection.    Alcohol abuse:  - watch for withdrawal symptoms.    Anemia of chronic disease:  Transfuse to keep hemoglobin greater than 7.     DVT prophylaxis: scd's Code Status: full code.  Family Communication: none at bedsdie.  Disposition Plan: pending further work up.    Consultants:   Oncology  IR   Surgery.   Procedures:MRI liver.  Antimicrobials: None.   Subjective: No chest pain or sob.   Objective: Vitals:   03/21/18 1404 03/21/18 1455 03/21/18 2133 03/22/18 0454  BP: (!) 149/77 134/78 (!) 156/86 (!) 142/77  Pulse: 75 65 (!) 59 (!) 58  Resp: (!) 21 18 (!) 22 14  Temp:  100 F (37.8 C) 99.5 F (37.5 C) 99.3 F (37.4 C)  TempSrc:  Oral Oral Oral  SpO2: 99% 97% 98% 100%  Weight:  65.8 kg    Height:  5\' 6"  (1.676 m)      Intake/Output Summary (Last 24 hours) at 03/22/2018 1759 Last data filed at 03/22/2018 7824 Gross per 24 hour  Intake 50 ml  Output 1750  ml  Net -1700 ml   Filed Weights   03/21/18 1455  Weight: 65.8 kg    Examination:  General exam: Appears calm and comfortable  Respiratory system: diminished air entry on the right greater than left.  Cardiovascular system: S1 & S2 heard, RRR.  Gastrointestinal system: Abdomen is soft , mildly tender.  Central nervous system: Alert and oriented to place and person.  Extremities: Symmetric 5 x 5 power. Skin: No rashes, lesions or ulcers Psychiatry:  Mood & affect appropriate.     Data Reviewed: I have personally reviewed following labs and imaging studies  CBC: Recent Labs  Lab 03/20/18 1754 03/21/18 0641 03/22/18 0519  WBC 9.2 7.6 8.3  NEUTROABS 6.0  --  5.9  HGB 11.1* 11.8* 12.5  HCT 34.7* 37.7 39.3  MCV 96.9 97.7 94.9  PLT 268 250 235   Basic Metabolic Panel: Recent Labs  Lab 03/20/18 1754 03/21/18 0701 03/21/18 1042 03/22/18 0519  NA 136 140 140 135  K 3.2* 3.7 3.7 3.7  CL 102 112* 105 101  CO2 27 24 29 26   GLUCOSE 99 88 123* 99  BUN 8 7* 9 6*  CREATININE 0.59 0.47 0.58 0.47  CALCIUM 8.5* 7.6* 8.5* 8.5*   GFR: Estimated Creatinine Clearance: 56.9 mL/min (by C-G formula based on SCr of 0.47 mg/dL). Liver Function Tests: Recent  Labs  Lab 03/20/18 1754 03/21/18 1042 03/22/18 0519  AST 19 26 21   ALT 11 12 12   ALKPHOS 69 66 67  BILITOT 0.8 0.8 1.2  PROT 8.5* 8.2* 8.3*  ALBUMIN 2.9* 2.7* 2.8*   No results for input(s): LIPASE, AMYLASE in the last 168 hours. No results for input(s): AMMONIA in the last 168 hours. Coagulation Profile: No results for input(s): INR, PROTIME in the last 168 hours. Cardiac Enzymes: No results for input(s): CKTOTAL, CKMB, CKMBINDEX, TROPONINI in the last 168 hours. BNP (last 3 results) No results for input(s): PROBNP in the last 8760 hours. HbA1C: No results for input(s): HGBA1C in the last 72 hours. CBG: No results for input(s): GLUCAP in the last 168 hours. Lipid Profile: No results for input(s): CHOL, HDL,  LDLCALC, TRIG, CHOLHDL, LDLDIRECT in the last 72 hours. Thyroid Function Tests: Recent Labs    03/21/18 0641  TSH 5.045*   Anemia Panel: Recent Labs    03/21/18 0641 03/21/18 0701  VITAMINB12  --  243  FOLATE  --  11.2  FERRITIN  --  105  TIBC  --  254  IRON  --  38  RETICCTPCT 1.0  --    Sepsis Labs: No results for input(s): PROCALCITON, LATICACIDVEN in the last 168 hours.  No results found for this or any previous visit (from the past 240 hour(s)).       Radiology Studies: Dg Pelvis 1-2 Views  Result Date: 03/21/2018 CLINICAL DATA:  Trauma secondary to a fall at home yesterday. EXAM: PELVIS - 1-2 VIEW COMPARISON:  CT scan dated 03/20/2018 FINDINGS: There is no evidence of pelvic fracture or diastasis. There are slight arthritic changes of both hips. Bowel gas pattern is normal. IMPRESSION: No acute abnormalities of the pelvis. Electronically Signed   By: Lorriane Shire M.D.   On: 03/21/2018 08:09   Ct Head Wo Contrast  Result Date: 03/21/2018 CLINICAL DATA:  Altered level of consciousness, history of alcohol abuse, hypertension, smoker EXAM: CT HEAD WITHOUT CONTRAST TECHNIQUE: Contiguous axial images were obtained from the base of the skull through the vertex without intravenous contrast. Sagittal and coronal MPR images reconstructed from axial data set. COMPARISON:  06/10/2015 FINDINGS: Brain: Generalized atrophy. Normal ventricular morphology. No midline shift or mass effect. Probable small vessel chronic ischemic changes of deep cerebral white matter. No intracranial hemorrhage, mass lesion, evidence of acute infarction, or extra-axial fluid collection. Vascular: Atherosclerotic calcifications of internal carotid arteries at skull base Skull: Intact Sinuses/Orbits: Frontal sinus osteoma unchanged. Paranasal sinuses and mastoid air cells otherwise clear. Other: N/A IMPRESSION: Atrophy with probable small vessel chronic ischemic changes of deep cerebral white matter. No acute  intracranial abnormalities. Electronically Signed   By: Lavonia Dana M.D.   On: 03/21/2018 08:00   Ct Chest W Contrast  Result Date: 03/20/2018 CLINICAL DATA:  Right-sided rib pain after mechanical fall. EXAM: CT CHEST, ABDOMEN, AND PELVIS WITH CONTRAST TECHNIQUE: Multidetector CT imaging of the chest, abdomen and pelvis was performed following the standard protocol during bolus administration of intravenous contrast. CONTRAST:  160mL ISOVUE-300 IOPAMIDOL (ISOVUE-300) INJECTION 61% COMPARISON:  CT abdomen and pelvis 08/28/2016. MRI abdomen 08/30/2016. FINDINGS: CT CHEST FINDINGS Cardiovascular: Normal caliber thoracic aorta. Scattered aortic calcifications. Great vessel origins are patent. Mild cardiac enlargement. No significant pericardial effusion. Coronary artery calcifications. Mediastinum/Nodes: Esophagus is decompressed. No significant lymphadenopathy in the chest. Lungs/Pleura: Scattered emphysematous changes in the lungs. Mild dependent atelectasis. Focal area consolidation in the right lung base could represent pneumonia possibly  aspiration or contusion. No pleural effusions. No pneumothorax. Musculoskeletal: Degenerative changes in the spine. No vertebral compression deformities. Normal alignment. Sternum appears intact. Minimally displaced fracture of the right anterolateral tenth rib. CT ABDOMEN PELVIS FINDINGS Hepatobiliary: Large poorly defined hypoenhancing mass replacing the gallbladder and with infiltration into the adjacent liver involving segments 4 and 5. There is infiltration in the adjacent fat suggesting local spread. 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 Fat planes with the adjacent duodenum and splenic flexure of the colon or non distinct suggesting possible direct invasion. No bile duct dilatation. Pancreas: Unremarkable. No pancreatic ductal dilatation or surrounding  inflammatory changes. Spleen: Normal in size without focal abnormality. Adrenals/Urinary Tract: Adrenal glands are unremarkable. Kidneys are normal, without renal calculi, focal lesion, or hydronephrosis. Bladder is unremarkable. Stomach/Bowel: Stomach, small bowel, and colon are mostly decompressed. Colonic diverticula without evidence of diverticulitis. Appendix is not identified. Vascular/Lymphatic: Calcification of the aorta. No aneurysm. No significant lymphadenopathy. Reproductive: Surgical absence of the gallbladder. Other: Free fluid in the pelvis may be reactive or malignant. No free air. Abdominal wall musculature appears intact. Musculoskeletal: Degenerative changes in the spine and hips. Schmorl's node at L1. No destructive bone lesions. No acute fractures are identified. IMPRESSION: 1. Minimally displaced fracture of the right anterolateral tenth rib. 2. Focal consolidation in the right lung base could represent pneumonia, contusion, or aspiration. 3. 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 4. Free fluid in the pelvis may be reactive or malignant. 5. Colonic diverticulosis without evidence of diverticulitis. Aortic Atherosclerosis (ICD10-I70.0). Electronically Signed   By: Lucienne Capers M.D.   On: 03/20/2018 22:11   Ct Abdomen Pelvis W Contrast  Result Date: 03/20/2018 CLINICAL DATA:  Right-sided rib pain after mechanical fall. EXAM: CT CHEST, ABDOMEN, AND PELVIS WITH CONTRAST TECHNIQUE: Multidetector CT imaging of the chest, abdomen and pelvis was performed following the standard protocol during bolus administration of intravenous contrast. CONTRAST:  149mL ISOVUE-300 IOPAMIDOL (ISOVUE-300) INJECTION 61% COMPARISON:  CT abdomen and pelvis 08/28/2016. MRI abdomen 08/30/2016. FINDINGS: CT CHEST FINDINGS Cardiovascular: Normal caliber thoracic  aorta. Scattered aortic calcifications. Great vessel origins are patent. Mild cardiac enlargement. No significant pericardial effusion. Coronary artery calcifications. Mediastinum/Nodes: Esophagus is decompressed. No significant lymphadenopathy in the chest. Lungs/Pleura: Scattered emphysematous changes in the lungs. Mild dependent atelectasis. Focal area consolidation in the right lung base could represent pneumonia possibly aspiration or contusion. No pleural effusions. No pneumothorax. Musculoskeletal: Degenerative changes in the spine. No vertebral compression deformities. Normal alignment. Sternum appears intact. Minimally displaced fracture of the right anterolateral tenth rib. CT ABDOMEN PELVIS FINDINGS Hepatobiliary: Large poorly defined hypoenhancing mass replacing the gallbladder and with infiltration into the adjacent liver involving segments 4 and 5. There is infiltration in the adjacent fat suggesting local spread. 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 Fat planes with the adjacent duodenum and splenic flexure of the colon or non distinct suggesting possible direct invasion. No bile duct dilatation. Pancreas: Unremarkable. No pancreatic ductal dilatation or surrounding inflammatory changes. Spleen: Normal in size without focal abnormality. Adrenals/Urinary Tract: Adrenal glands are unremarkable. Kidneys are normal, without renal calculi, focal lesion, or hydronephrosis. Bladder is unremarkable. Stomach/Bowel: Stomach, small bowel, and colon are mostly decompressed. Colonic diverticula without evidence  of diverticulitis. Appendix is not identified. Vascular/Lymphatic: Calcification of the aorta. No aneurysm. No significant lymphadenopathy. Reproductive: Surgical absence of the gallbladder. Other: Free fluid in the pelvis may be reactive or malignant. No free air. Abdominal wall musculature appears  intact. Musculoskeletal: Degenerative changes in the spine and hips. Schmorl's node at L1. No destructive bone lesions. No acute fractures are identified. IMPRESSION: 1. Minimally displaced fracture of the right anterolateral tenth rib. 2. Focal consolidation in the right lung base could represent pneumonia, contusion, or aspiration. 3. 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 4. Free fluid in the pelvis may be reactive or malignant. 5. Colonic diverticulosis without evidence of diverticulitis. Aortic Atherosclerosis (ICD10-I70.0). Electronically Signed   By: Lucienne Capers M.D.   On: 03/20/2018 22:11   Mr Liver W Wo Contrast  Result Date: 03/22/2018 CLINICAL DATA:  Gallbladder mass, evaluate liver EXAM: MRI ABDOMEN WITHOUT AND WITH CONTRAST TECHNIQUE: Multiplanar multisequence MR imaging of the abdomen was performed both before and after the administration of intravenous contrast. CONTRAST:  6 mL Gadovist IV COMPARISON:  CT abdomen/pelvis dated 03/20/2018. MRI abdomen dated 08/30/2016. CT abdomen/pelvis dated 08/28/2016. FINDINGS: Motion degraded images. Lower chest: Small right and trace left pleural effusions. Hepatobiliary: Progressive gallbladder mass (worrisome for adenocarcinoma), now measuring 7.7 x 11.6 x 10.6, increased from 2018. Mass abuts liver and direct extension is difficult to exclude. Liver is otherwise within normal limits. No discrete/separate metastases. No intrahepatic or extrahepatic ductal dilatation. Pancreas:  Within normal limits. Spleen:  Within normal limits. Adrenals/Urinary Tract:  Adrenal glands are within normal limits. Kidneys are within normal limits.  No hydronephrosis. Stomach/Bowel: Stomach is notable for a tiny hiatal hernia. Visualized bowel is unremarkable. Vascular/Lymphatic:  No evidence of aneurysm. 16 mm short axis  node in the porta hepatis (series 6/image 31), suspicious. Other:  No abdominal ascites. Musculoskeletal: No focal osseous lesions. IMPRESSION: Motion degraded images. 11.6 cm gallbladder mass, progressive from 2018, worrisome for adenocarcinoma. Mass abuts the liver and direct extension is difficult to exclude. 16 cm short axis node in the porta hepatis, suspicious for nodal metastasis. Electronically Signed   By: Julian Hy M.D.   On: 03/22/2018 04:29        Scheduled Meds: . lidocaine  1 patch Transdermal Q24H  . thiamine  100 mg Oral Daily   Continuous Infusions:   LOS: 1 day    Time spent: 26 min    Hosie Poisson, MD [facility] Pager [phone]  If 7PM-7AM, please contact night-coverage [email or web address] Password [facility] 03/22/2018, 5:59 PM

## 2018-03-23 ENCOUNTER — Inpatient Hospital Stay (HOSPITAL_COMMUNITY): Payer: Medicare Other

## 2018-03-23 NOTE — Progress Notes (Signed)
PROGRESS NOTE    Ann Goodwin  JEH:[id number] DOB: 12-06-1940 DOA: 03/20/2018 PCP: Benito Mccreedy, MD     Brief Narrative:  Ann Magner Johnsonis a 78 y.o.femalewithhistory of alcohol abuse comes in for a fall.  She underwent CT chest and was found to have  Right 10 th rib fracture and possible contusion.  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   Assessment & Plan:   Active Problems:   Right pulmonary contusion   Closed fracture of one rib of right side   Normochromic normocytic anemia   Pulmonary contusion   Alcohol abuse   Minimally displaced fracture of the right anterolateral tenth rib   Gallbladder cancer (HCC)  Multiple falls with right Pulmonary contusion and right rib fractures: Continue with pulm toilet and incentive spirometer. Pt had some decreased air entry on the right ad some coughing. Repeat CXR is wnl.  PT eval recommended.    Progression of GB carcinoma: Oncology consulted and MRI of the liver ordered and independently reviewed.  IR consult for biopsy.  Surgery consulted to see if the patient is a candidate for resection.    Alcohol abuse:  - watch for withdrawal symptoms. No signs of withdrawal.    Anemia of chronic disease:  Transfuse to keep hemoglobin greater than 7.     DVT prophylaxis: scd's Code Status: full code.  Family Communication: none at bedsdie.  Disposition Plan: pending further work up.    Consultants:   Oncology  IR   Surgery.   Procedures:MRI liver.  Antimicrobials: None.   Subjective: Sob improved.   Objective: Vitals:   03/22/18 0454 03/22/18 2239 03/23/18 0548 03/23/18 1322  BP: (!) 142/77 119/74 136/69 139/65  Pulse: (!) 58 (!) 59 65 73  Resp: 14 20 18 17   Temp: 99.3 F (37.4 C) 98.4 F (36.9 C) 98.7 F (37.1 C) 98.7 F (37.1 C)  TempSrc: Oral Oral Oral Oral  SpO2: 100% 98% 95% 99%  Weight:      Height:        Intake/Output Summary (Last 24 hours) at 03/23/2018  1553 Last data filed at 03/23/2018 0500 Gross per 24 hour  Intake 50 ml  Output 500 ml  Net -450 ml   Filed Weights   03/21/18 1455  Weight: 65.8 kg    Examination:  General exam: Appears calm and comfortable  Respiratory system: diminished air entry on the right greater than left. No wheezing heard.  Cardiovascular system: S1 & S2 heard, RRR.  Gastrointestinal system: Abdomen is soft , mildly tender.  Central nervous system: Alert and oriented to place and person.  Extremities: Symmetric 5 x 5 power. Skin: No rashes, lesions or ulcers Psychiatry:  Mood & affect appropriate.     Data Reviewed: I have personally reviewed following labs and imaging studies  CBC: Recent Labs  Lab 03/20/18 1754 03/21/18 0641 03/22/18 0519  WBC 9.2 7.6 8.3  NEUTROABS 6.0  --  5.9  HGB 11.1* 11.8* 12.5  HCT 34.7* 37.7 39.3  MCV 96.9 97.7 94.9  PLT 268 250 378   Basic Metabolic Panel: Recent Labs  Lab 03/20/18 1754 03/21/18 0701 03/21/18 1042 03/22/18 0519  NA 136 140 140 135  K 3.2* 3.7 3.7 3.7  CL 102 112* 105 101  CO2 27 24 29 26   GLUCOSE 99 88 123* 99  BUN 8 7* 9 6*  CREATININE 0.59 0.47 0.58 0.47  CALCIUM 8.5* 7.6* 8.5* 8.5*   GFR: Estimated Creatinine Clearance:  56.9 mL/min (by C-G formula based on SCr of 0.47 mg/dL). Liver Function Tests: Recent Labs  Lab 03/20/18 1754 03/21/18 1042 03/22/18 0519  AST 19 26 21   ALT 11 12 12   ALKPHOS 69 66 67  BILITOT 0.8 0.8 1.2  PROT 8.5* 8.2* 8.3*  ALBUMIN 2.9* 2.7* 2.8*   No results for input(s): LIPASE, AMYLASE in the last 168 hours. No results for input(s): AMMONIA in the last 168 hours. Coagulation Profile: No results for input(s): INR, PROTIME in the last 168 hours. Cardiac Enzymes: No results for input(s): CKTOTAL, CKMB, CKMBINDEX, TROPONINI in the last 168 hours. BNP (last 3 results) No results for input(s): PROBNP in the last 8760 hours. HbA1C: No results for input(s): HGBA1C in the last 72 hours. CBG: No  results for input(s): GLUCAP in the last 168 hours. Lipid Profile: No results for input(s): CHOL, HDL, LDLCALC, TRIG, CHOLHDL, LDLDIRECT in the last 72 hours. Thyroid Function Tests: Recent Labs    03/21/18 0641  TSH 5.045*   Anemia Panel: Recent Labs    03/21/18 0641 03/21/18 0701  VITAMINB12  --  243  FOLATE  --  11.2  FERRITIN  --  105  TIBC  --  254  IRON  --  38  RETICCTPCT 1.0  --    Sepsis Labs: No results for input(s): PROCALCITON, LATICACIDVEN in the last 168 hours.  No results found for this or any previous visit (from the past 240 hour(s)).       Radiology Studies: Dg Chest 2 View  Result Date: 03/23/2018 CLINICAL DATA:  Shortness of breath, cough, fall EXAM: CHEST - 2 VIEW COMPARISON:  CT chest dated 03/20/2018 FINDINGS: Lungs are essentially clear.  No pleural effusion or pneumothorax. The heart is normal in size. Nondisplaced right lateral 10th and 11th rib fractures. Degenerative changes of the thoracic spine. IMPRESSION: Nondisplaced right lateral 10th and 11th rib fractures. Electronically Signed   By: Julian Hy M.D.   On: 03/23/2018 09:15   Mr Liver W Wo Contrast  Result Date: 03/22/2018 CLINICAL DATA:  Gallbladder mass, evaluate liver EXAM: MRI ABDOMEN WITHOUT AND WITH CONTRAST TECHNIQUE: Multiplanar multisequence MR imaging of the abdomen was performed both before and after the administration of intravenous contrast. CONTRAST:  6 mL Gadovist IV COMPARISON:  CT abdomen/pelvis dated 03/20/2018. MRI abdomen dated 08/30/2016. CT abdomen/pelvis dated 08/28/2016. FINDINGS: Motion degraded images. Lower chest: Small right and trace left pleural effusions. Hepatobiliary: Progressive gallbladder mass (worrisome for adenocarcinoma), now measuring 7.7 x 11.6 x 10.6, increased from 2018. Mass abuts liver and direct extension is difficult to exclude. Liver is otherwise within normal limits. No discrete/separate metastases. No intrahepatic or extrahepatic ductal  dilatation. Pancreas:  Within normal limits. Spleen:  Within normal limits. Adrenals/Urinary Tract:  Adrenal glands are within normal limits. Kidneys are within normal limits.  No hydronephrosis. Stomach/Bowel: Stomach is notable for a tiny hiatal hernia. Visualized bowel is unremarkable. Vascular/Lymphatic:  No evidence of aneurysm. 16 mm short axis node in the porta hepatis (series 6/image 31), suspicious. Other:  No abdominal ascites. Musculoskeletal: No focal osseous lesions. IMPRESSION: Motion degraded images. 11.6 cm gallbladder mass, progressive from 2018, worrisome for adenocarcinoma. Mass abuts the liver and direct extension is difficult to exclude. 16 cm short axis node in the porta hepatis, suspicious for nodal metastasis. Electronically Signed   By: Julian Hy M.D.   On: 03/22/2018 04:29        Scheduled Meds: . lidocaine  1 patch Transdermal Q24H  . thiamine  100 mg Oral Daily   Continuous Infusions:   LOS: 2 days    Time spent: 26 min    Hosie Poisson, MD [facility] Pager [phone]  If 7PM-7AM, please contact night-coverage [email or web address] Password [facility] 03/23/2018, 3:53 PM

## 2018-03-24 ENCOUNTER — Inpatient Hospital Stay (HOSPITAL_COMMUNITY): Payer: Medicare Other

## 2018-03-24 ENCOUNTER — Encounter (HOSPITAL_COMMUNITY): Payer: Self-pay | Admitting: Physician Assistant

## 2018-03-24 MED ORDER — GELATIN ABSORBABLE 12-7 MM EX MISC
CUTANEOUS | Status: AC
  Filled 2018-03-24: qty 1

## 2018-03-24 MED ORDER — MIDAZOLAM HCL 2 MG/2ML IJ SOLN
INTRAMUSCULAR | Status: AC
  Filled 2018-03-24: qty 2

## 2018-03-24 MED ORDER — FENTANYL CITRATE (PF) 100 MCG/2ML IJ SOLN
INTRAMUSCULAR | Status: AC | PRN
  Administered 2018-03-24: 50 ug via INTRAVENOUS

## 2018-03-24 MED ORDER — LIDOCAINE HCL 1 % IJ SOLN
INTRAMUSCULAR | Status: AC
  Filled 2018-03-24: qty 20

## 2018-03-24 MED ORDER — FENTANYL CITRATE (PF) 100 MCG/2ML IJ SOLN
INTRAMUSCULAR | Status: AC
  Filled 2018-03-24: qty 2

## 2018-03-24 MED ORDER — MIDAZOLAM HCL 2 MG/2ML IJ SOLN
INTRAMUSCULAR | Status: AC | PRN
  Administered 2018-03-24: 0.5 mg via INTRAVENOUS
  Administered 2018-03-24: 1 mg via INTRAVENOUS

## 2018-03-24 NOTE — Progress Notes (Signed)
Physical Therapy Treatment Patient Details Name: Ann Goodwin MRN: [id number] DOB: [birth year] Today's Date: 03/24/2018    History of Present Illness Pt s/p fall with pumonary contusion and R 10th rib fx. 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  Pt with hx of ETOH abuse and chronic edema    PT Comments    Progressing slowly with mobility. Pt c/o R flank pain and lower abdomen pain. She walked a short distance on today-to/from bathroom with a RW. Will continue to follow and progress activity as tolerated.    Follow Up Recommendations  SNF(depending on progress)     Equipment Recommendations  Rolling walker with 5" wheels(if pt doesn't already have one)    Recommendations for Other Services       Precautions / Restrictions Precautions Precautions: Fall Restrictions Weight Bearing Restrictions: No    Mobility  Bed Mobility               General bed mobility comments: oob in recliner  Transfers Overall transfer level: Needs assistance Equipment used: Rolling walker (2 wheeled) Transfers: Sit to/from Stand Sit to Stand: Min assist         General transfer comment: Increaesed time and cues for safety, technique. Assist to rise, stabilize, control descent.   Ambulation/Gait Ambulation/Gait assistance: Min assist Gait Distance (Feet): 15 Feet(x2) Assistive device: Rolling walker (2 wheeled) Gait Pattern/deviations: Trunk flexed;Wide base of support;Decreased step length - left;Decreased step length - right;Decreased stride length     General Gait Details: Assist to stabilize pt and maneuver safely with RW. Cues for safety, proper use of RW, Slow, nearly shuffling gait pattern.    Stairs             Wheelchair Mobility    Modified Rankin (Stroke Patients Only)       Balance Overall balance assessment: Needs assistance         Standing balance support: Bilateral upper extremity supported Standing balance-Leahy Scale:  Poor                              Cognition Arousal/Alertness: Awake/alert Behavior During Therapy: WFL for tasks assessed/performed Overall Cognitive Status: Within Functional Limits for tasks assessed                                        Exercises      General Comments        Pertinent Vitals/Pain Pain Assessment: Faces Faces Pain Scale: Hurts little more Pain Location: R ribs with movement or coughing; also lower abd pain Pain Descriptors / Indicators: Discomfort;Grimacing;Sore Pain Intervention(s): Limited activity within patient's tolerance    Home Living                      Prior Function            PT Goals (current goals can now be found in the care plan section) Progress towards PT goals: Progressing toward goals    Frequency    Min 3X/week      PT Plan Current plan remains appropriate    Co-evaluation              AM-PAC PT "6 Clicks" Mobility   Outcome Measure  Help needed turning from your back to your side while in a flat bed without using bedrails?: A  Lot Help needed moving from lying on your back to sitting on the side of a flat bed without using bedrails?: A Lot Help needed moving to and from a bed to a chair (including a wheelchair)?: A Little Help needed standing up from a chair using your arms (e.g., wheelchair or bedside chair)?: A Little Help needed to walk in hospital room?: A Little Help needed climbing 3-5 steps with a railing? : A Lot 6 Click Score: 15    End of Session   Activity Tolerance: Patient limited by pain Patient left: in chair;with call bell/phone within reach;with chair alarm set   PT Visit Diagnosis: Unsteadiness on feet (R26.81);History of falling (Z91.81);Difficulty in walking, not elsewhere classified (R26.2);Muscle weakness (generalized) (M62.81)     Time: 1610-9604 PT Time Calculation (min) (ACUTE ONLY): 18 min  Charges:  $Gait Training: 8-22 mins                          Weston Anna, PT Acute Rehabilitation Services Pager: [phone] Office: [phone]

## 2018-03-24 NOTE — Procedures (Signed)
Interventional Radiology Procedure Note  Procedure: US guided liver mass biopsy. Complications: None Recommendations:  - Ok to shower tomorrow - Do not submerge for 7 days - Routine care   Signed,  Shanae Luo S. Salia Cangemi, DO    

## 2018-03-24 NOTE — Discharge Instructions (Addendum)
Moderate Conscious Sedation, Adult, Care After °These instructions provide you with information about caring for yourself after your procedure. Your health care provider may also give you more specific instructions. Your treatment has been planned according to current medical practices, but problems sometimes occur. Call your health care provider if you have any problems or questions after your procedure. °What can I expect after the procedure? °After your procedure, it is common: °· To feel sleepy for several hours. °· To feel clumsy and have poor balance for several hours. °· To have poor judgment for several hours. °· To vomit if you eat too soon. °Follow these instructions at home: °For at least 24 hours after the procedure: ° °· Do not: °? Participate in activities where you could fall or become injured. °? Drive. °? Use heavy machinery. °? Drink alcohol. °? Take sleeping pills or medicines that cause drowsiness. °? Make important decisions or sign legal documents. °? Take care of children on your own. °· Rest. °Eating and drinking °· Follow the diet recommended by your health care provider. °· If you vomit: °? Drink water, juice, or soup when you can drink without vomiting. °? Make sure you have little or no nausea before eating solid foods. °General instructions °· Have a responsible adult stay with you until you are awake and alert. °· Take over-the-counter and prescription medicines only as told by your health care provider. °· If you smoke, do not smoke without supervision. °· Keep all follow-up visits as told by your health care provider. This is important. °Contact a health care provider if: °· You keep feeling nauseous or you keep vomiting. °· You feel light-headed. °· You develop a rash. °· You have a fever. °Get help right away if: °· You have trouble breathing. °This information is not intended to replace advice given to you by your health care provider. Make sure you discuss any questions you have  with your health care provider. °Document Released: 11/26/2012 Document Revised: 07/11/2015 Document Reviewed: 05/28/2015 °Elsevier Interactive Patient Education © 2019 [employer] ° °

## 2018-03-24 NOTE — Care Management Important Message (Signed)
Important Message  Patient Details  Name: PERNELL DIKES MRN: [id number] Date of Birth: 12-15-1940   Medicare Important Message Given:  Yes    Kerin Salen 03/24/2018, 12:02 [place] Message  Patient Details  Name: JAUNA RACZYNSKI MRN: [id number] Date of Birth: 1940/11/21   Medicare Important Message Given:  Yes    Kerin Salen 03/24/2018, 12:01 PM

## 2018-03-24 NOTE — Progress Notes (Signed)
  [facility] Progress Note     Subjective: CC-  Up in chair this morning. States that she's feeling better than when she came in. Continues to have some upper abdominal pain. Denies n/v. BM yesterday.  Liver biopsy in IR today.  Objective: Vital signs in last 24 hours: Temp:  [98.3 F (36.8 C)-98.7 F (37.1 C)] 98.3 F (36.8 C) (02/03 0507) Pulse Rate:  [60-73] 60 (02/03 0507) Resp:  [17-20] 19 (02/03 0507) BP: (129-139)/(65-73) 129/71 (02/03 0507) SpO2:  [95 %-99 %] 95 % (02/03 0507) Last BM Date: 03/23/18  Intake/Output from previous day: 02/02 0701 - 02/03 0700 In: 360 [P.O.:360] Out: 400 [Urine:400] Intake/Output this shift: No intake/output data recorded.  PE: Gen:  Alert, NAD, pleasant HEENT: EOM's intact, pupils equal and round Pulm:  effort normal Abd: Soft, mild distension, +BS, no HSM, TTP RUQ Ext:  Calves soft and nontender Skin: no rashes noted, warm and dry  Lab Results:  Recent Labs    03/22/18 0519  WBC 8.3  HGB 12.5  HCT 39.3  PLT 269   BMET Recent Labs    03/21/18 1042 03/22/18 0519  NA 140 135  K 3.7 3.7  CL 105 101  CO2 29 26  GLUCOSE 123* 99  BUN 9 6*  CREATININE 0.58 0.47  CALCIUM 8.5* 8.5*   PT/INR No results for input(s): LABPROT, INR in the last 72 hours. CMP     Component Value Date/Time   NA 135 03/22/2018 0519   K 3.7 03/22/2018 0519   CL 101 03/22/2018 0519   CO2 26 03/22/2018 0519   GLUCOSE 99 03/22/2018 0519   BUN 6 (L) 03/22/2018 0519   CREATININE 0.47 03/22/2018 0519   CALCIUM 8.5 (L) 03/22/2018 0519   PROT 8.3 (H) 03/22/2018 0519   ALBUMIN 2.8 (L) 03/22/2018 0519   AST 21 03/22/2018 0519   ALT 12 03/22/2018 0519   ALKPHOS 67 03/22/2018 0519   BILITOT 1.2 03/22/2018 0519   GFRNONAA >60 03/22/2018 0519   GFRAA >60 03/22/2018 0519   Lipase     Component Value Date/Time   LIPASE 14 08/25/2016 2042       Studies/Results: Dg Chest 2 View  Result Date: 03/23/2018 CLINICAL DATA:   Shortness of breath, cough, fall EXAM: CHEST - 2 VIEW COMPARISON:  CT chest dated 03/20/2018 FINDINGS: Lungs are essentially clear.  No pleural effusion or pneumothorax. The heart is normal in size. Nondisplaced right lateral 10th and 11th rib fractures. Degenerative changes of the thoracic spine. IMPRESSION: Nondisplaced right lateral 10th and 11th rib fractures. Electronically Signed   By: Julian Hy M.D.   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 Mass abuts the liver and direct extension is difficult to exclude. - going for liver biopsy in IR today - I will discuss this case with Dr. Barry Dienes.  ID - none  FEN - reg diet VTE - SCDs Foley - none Follow up - TBD   LOS: 3 days    [place] , [facility] 03/24/2018, 10:09 AM Pager: [phone] Mon-Thurs 7:00 am-4:30 pm Fri 7:00 am -11:30 AM Sat-Sun 7:00 am-11:30 am

## 2018-03-24 NOTE — Progress Notes (Signed)
PROGRESS NOTE    Ann Goodwin  JSE:[id number] DOB: 1940/05/31 DOA: 03/20/2018 PCP: Benito Mccreedy, MD     Brief Narrative:  Ann Goodwin a 78 y.o.femalewithhistory of alcohol abuse comes in for a fall.  She underwent CT chest and was found to have  Right 10 th rib fracture and possible contusion.  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   Assessment & Plan:   Active Problems:   Right pulmonary contusion   Closed fracture of one rib of right side   Normochromic normocytic anemia   Pulmonary contusion   Alcohol abuse   Minimally displaced fracture of the right anterolateral tenth rib   Gallbladder cancer (HCC)  Multiple falls with right Pulmonary contusion and right rib fractures: Continue with pulm toilet and incentive spirometer. Pt had some decreased air entry on the right and some coughing. Repeat CXR is wnl.  PT eval recommending SNF.    Progression of GB carcinoma: Oncology consulted and MRI of the liver ordered and independently reviewed.  IR consult for biopsy, which will take place today.  Surgery consulted to see if the patient is a candidate for resection.    Alcohol abuse:  - watch for withdrawal symptoms. No signs of withdrawal.    Anemia of chronic disease:  Transfuse to keep hemoglobin greater than 7.     DVT prophylaxis: scd's Code Status: full code.  Family Communication: none at bedsdie.  Disposition Plan: pending further work up.    Consultants:   Oncology  IR   Surgery.   Procedures:MRI liver.  Antimicrobials: None.   Subjective: Sob improved. Some abdominal pain.   Objective: Vitals:   03/23/18 0548 03/23/18 1322 03/23/18 2141 03/24/18 0507  BP: 136/69 139/65 132/73 129/71  Pulse: 65 73 68 60  Resp: 18 17 20 19   Temp: 98.7 F (37.1 C) 98.7 F (37.1 C) 98.5 F (36.9 C) 98.3 F (36.8 C)  TempSrc: Oral Oral Oral Oral  SpO2: 95% 99% 96% 95%  Weight:      Height:        Intake/Output  Summary (Last 24 hours) at 03/24/2018 1117 Last data filed at 03/24/2018 0527 Gross per 24 hour  Intake 360 ml  Output 400 ml  Net -40 ml   Filed Weights   03/21/18 1455  Weight: 65.8 kg    Examination:  General exam: Appears calm and comfortable. Not in distress.  Respiratory system: diminished air entry on the right greater than left. No wheezing heard.  Cardiovascular system: S1 & S2 heard, RRR.  Gastrointestinal system: Abdomen is soft , mildly tender in the right upper quadrant. Bowel sounds good.  Central nervous system: Alert and oriented to place and person.  Extremities: no pedal edema .  Skin: No rashes, lesions or ulcers Psychiatry:  Mood & affect appropriate.     Data Reviewed: I have personally reviewed following labs and imaging studies  CBC: Recent Labs  Lab 03/20/18 1754 03/21/18 0641 03/22/18 0519  WBC 9.2 7.6 8.3  NEUTROABS 6.0  --  5.9  HGB 11.1* 11.8* 12.5  HCT 34.7* 37.7 39.3  MCV 96.9 97.7 94.9  PLT 268 250 073   Basic Metabolic Panel: Recent Labs  Lab 03/20/18 1754 03/21/18 0701 03/21/18 1042 03/22/18 0519  NA 136 140 140 135  K 3.2* 3.7 3.7 3.7  CL 102 112* 105 101  CO2 27 24 29 26   GLUCOSE 99 88 123* 99  BUN 8 7* 9 6*  CREATININE 0.59 0.47 0.58 0.47  CALCIUM 8.5* 7.6* 8.5* 8.5*   GFR: Estimated Creatinine Clearance: 56.9 mL/min (by C-G formula based on SCr of 0.47 mg/dL). Liver Function Tests: Recent Labs  Lab 03/20/18 1754 03/21/18 1042 03/22/18 0519  AST 19 26 21   ALT 11 12 12   ALKPHOS 69 66 67  BILITOT 0.8 0.8 1.2  PROT 8.5* 8.2* 8.3*  ALBUMIN 2.9* 2.7* 2.8*   No results for input(s): LIPASE, AMYLASE in the last 168 hours. No results for input(s): AMMONIA in the last 168 hours. Coagulation Profile: No results for input(s): INR, PROTIME in the last 168 hours. Cardiac Enzymes: No results for input(s): CKTOTAL, CKMB, CKMBINDEX, TROPONINI in the last 168 hours. BNP (last 3 results) No results for input(s): PROBNP in the  last 8760 hours. HbA1C: No results for input(s): HGBA1C in the last 72 hours. CBG: No results for input(s): GLUCAP in the last 168 hours. Lipid Profile: No results for input(s): CHOL, HDL, LDLCALC, TRIG, CHOLHDL, LDLDIRECT in the last 72 hours. Thyroid Function Tests: No results for input(s): TSH, T4TOTAL, FREET4, T3FREE, THYROIDAB in the last 72 hours. Anemia Panel: No results for input(s): VITAMINB12, FOLATE, FERRITIN, TIBC, IRON, RETICCTPCT in the last 72 hours. Sepsis Labs: No results for input(s): PROCALCITON, LATICACIDVEN in the last 168 hours.  No results found for this or any previous visit (from the past 240 hour(s)).       Radiology Studies: Dg Chest 2 View  Result Date: 03/23/2018 CLINICAL DATA:  Shortness of breath, cough, fall EXAM: CHEST - 2 VIEW COMPARISON:  CT chest dated 03/20/2018 FINDINGS: Lungs are essentially clear.  No pleural effusion or pneumothorax. The heart is normal in size. Nondisplaced right lateral 10th and 11th rib fractures. Degenerative changes of the thoracic spine. IMPRESSION: Nondisplaced right lateral 10th and 11th rib fractures. Electronically Signed   By: Julian Hy M.D.   On: 03/23/2018 09:15        Scheduled Meds: . lidocaine  1 patch Transdermal Q24H  . thiamine  100 mg Oral Daily   Continuous Infusions:   LOS: 3 days    Time spent: 25 min    Hosie Poisson, MD [facility] Pager [phone]  If 7PM-7AM, please contact night-coverage [email or web address] Password [email or web address] 03/24/2018, 11:17 AM

## 2018-03-25 LAB — COMPREHENSIVE METABOLIC PANEL
ALT: 11 U/L (ref 0–44)
ANION GAP: 7 (ref 5–15)
AST: 19 U/L (ref 15–41)
Albumin: 2.6 g/dL — ABNORMAL LOW (ref 3.5–5.0)
Alkaline Phosphatase: 61 U/L (ref 38–126)
BUN: 16 mg/dL (ref 8–23)
CO2: 24 mmol/L (ref 22–32)
Calcium: 8.5 mg/dL — ABNORMAL LOW (ref 8.9–10.3)
Chloride: 103 mmol/L (ref 98–111)
Creatinine, Ser: 0.61 mg/dL (ref 0.44–1.00)
GFR calc Af Amer: >60 mL/min (ref >60)
GFR calc non Af Amer: >60 mL/min (ref >60)
Glucose, Bld: 96 mg/dL (ref 70–99)
Potassium: 3.9 mmol/L (ref 3.5–5.1)
Sodium: 134 mmol/L — ABNORMAL LOW (ref 135–145)
Total Bilirubin: 0.7 mg/dL (ref 0.3–1.2)
Total Protein: 8.4 g/dL — ABNORMAL HIGH (ref 6.5–8.1)

## 2018-03-25 LAB — CBC WITH DIFFERENTIAL/PLATELET
Abs Immature Granulocytes: 0.05 10*3/uL (ref 0.00–0.07)
Basophils Absolute: 0.1 10*3/uL (ref 0.0–0.1)
Basophils Relative: 1 %
Eosinophils Absolute: 0.2 10*3/uL (ref 0.0–0.5)
Eosinophils Relative: 3 %
HCT: 39.7 % (ref 36.0–46.0)
Hemoglobin: 12.4 g/dL (ref 12.0–15.0)
Immature Granulocytes: 1 %
Lymphocytes Relative: 16 %
Lymphs Abs: 1.2 10*3/uL (ref 0.7–4.0)
MCH: 30.6 pg (ref 26.0–34.0)
MCHC: 31.2 g/dL (ref 30.0–36.0)
MCV: 98 fL (ref 80.0–100.0)
Monocytes Absolute: 0.8 10*3/uL (ref 0.1–1.0)
Monocytes Relative: 10 %
Neutro Abs: 5.2 10*3/uL (ref 1.7–7.7)
Neutrophils Relative %: 69 %
Platelets: 275 10*3/uL (ref 150–400)
RBC: 4.05 MIL/uL (ref 3.87–5.11)
RDW: 13.8 % (ref 11.5–15.5)
WBC: 7.5 10*3/uL (ref 4.0–10.5)
nRBC: 0 % (ref 0.0–0.2)

## 2018-03-25 LAB — MAGNESIUM: Magnesium: 1.7 mg/dL (ref 1.7–2.4)

## 2018-03-25 NOTE — Progress Notes (Signed)
Ann Goodwin  10-30-1940 [id number]  Patient Care Team: Benito Mccreedy, MD as PCP - General (Internal Medicine)  Dr. Barry Dienes, surgical oncologist with our group has seen and evaluated the patient.  While Dr Barry Dienes feels it is potentially resectable from a technical standpoint, this would be a major operation with significant hepatic lobectomy and probable transverse colectomy and possible duodenal resection.  Significant morbidity risk for this.  She is skeptical the patient could tolerate such a large operation, especially if medical oncology does not feel the patient can even tolerate chemotherapy.    We would hold off on offering surgical resection unless medicine, cardiology, and medical oncology feel that the patient could tolerate such a large operation and the patient is motivated to do that (for which we have not gotten a sense that the patient is willing to be that aggressive).  Patient Active Problem List   Diagnosis Date Noted  . 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 Minimally displaced fracture of the right anterolateral tenth rib 03/22/2018  . Alcohol abuse   . Closed fracture of one rib of right side 03/21/2018  . Normochromic normocytic anemia 03/21/2018  . Pulmonary contusion 03/21/2018  . Right pulmonary contusion 03/20/2018  . Dilated aortic root ([place]) 08/28/2016  . Infestation by bed bug 08/28/2016  . Tobacco abuse 08/28/2016  . Acute pain of left shoulder   . Elevated troponin   . Cellulitis 11/02/2015  . Hypertension 11/02/2015  . Alcohol dependence ([place]) 11/02/2015  . Hypokalemia 11/02/2015  . 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 Chest pain 06/01/2013    Past Medical History:  Diagnosis Date  . Alcohol abuse   . Chronic edema   . Dilated aortic root ([place]) 08/28/2016  . Hyperlipidemia   . Hypertension   . Hypothyroid   . Infestation by bed bug   . Tobacco abuse   . Venous stasis     Past Surgical History:   Procedure Laterality Date  . ABDOMINAL HYSTERECTOMY    . BUNIONECTOMY      Social History   Socioeconomic History  . Marital status: Single    Spouse name: Not on file  . Number of children: Not on file  . Years of education: Not on file  . Highest education level: Not on file  Occupational History  . Not on file  Social Needs  . Financial resource strain: Not on file  . Food insecurity:    Worry: Not on file    Inability: Not on file  . Transportation needs:    Medical: Not on file    Non-medical: Not on file  Tobacco Use  . Smoking status: Current Every Day Smoker    Packs/day: 0.50    Types: Cigarettes  . Smokeless tobacco: Never Used  Substance and Sexual Activity  . Alcohol use: Yes    Alcohol/week: 2.0 standard drinks    Types: 2 Cans of beer per week    Comment: 2 or 3 40 oz every day  . Drug use: No  . Sexual activity: Not on file  Lifestyle  . Physical activity:    Days per week: Not on file    Minutes per session: Not on file  . Stress: Not on file  Relationships  . Social connections:    Talks on phone: Not on file    Gets together: Not on file    Attends religious service: Not on file    Active member of club or  organization: Not on file    Attends meetings of clubs or organizations: Not on file    Relationship status: Not on file  . Intimate partner violence:    Fear of current or ex partner: Not on file    Emotionally abused: Not on file    Physically abused: Not on file    Forced sexual activity: Not on file  Other Topics Concern  . Not on file  Social History Narrative  . Not on file    Family History  Problem Relation Age of Onset  . Stroke Mother   . Deep vein thrombosis Neg Hx   . Pulmonary embolism Neg Hx     Current Facility-Administered Medications  Medication Dose Route Frequency Provider Last Rate Last Dose  . acetaminophen (TYLENOL) tablet 650 mg  650 mg Oral Q6H PRN Rise Patience, MD   650 mg at 03/24/18 1749   Or   . acetaminophen (TYLENOL) suppository 650 mg  650 mg Rectal Q6H PRN Rise Patience, MD      . lidocaine (LIDODERM) 5 % 1 patch  1 patch Transdermal Q24H Rise Patience, MD   1 patch at 03/24/18 1747  . ondansetron (ZOFRAN) tablet 4 mg  4 mg Oral Q6H PRN Rise Patience, MD       Or  . ondansetron [facility]) injection 4 mg  4 mg Intravenous Q6H PRN Rise Patience, MD      . thiamine (VITAMIN B-1) tablet 100 mg  100 mg Oral Daily Rise Patience, MD   100 mg at 03/24/18 7824     No Known Allergies  BP 104/83 (BP Location: Left Arm)   Pulse 62   Temp 98 F (36.7 C) (Oral)   Resp 20   Ht 5\' 6"  (1.676 m)   Wt 65.8 kg   SpO2 96%   BMI 23.41 kg/m   Dg Chest 2 View  Result Date: 03/23/2018 CLINICAL DATA:  Shortness of breath, cough, fall EXAM: CHEST - 2 VIEW COMPARISON:  CT chest dated 03/20/2018 FINDINGS: Lungs are essentially clear.  No pleural effusion or pneumothorax. The heart is normal in size. Nondisplaced right lateral 10th and 11th rib fractures. Degenerative changes of the thoracic spine. IMPRESSION: Nondisplaced right lateral 10th and 11th rib fractures. Electronically Signed   By: Julian Hy M.D.   On: 03/23/2018 09:15   Dg Ribs Unilateral W/chest Right  Result Date: 03/20/2018 CLINICAL DATA:  Acute chest and RIGHT rib pain following fall today. EXAM: RIGHT RIBS AND CHEST - 3+ VIEW COMPARISON:  08/25/2016 and prior radiographs FINDINGS: UPPER limits normal heart size and mild LEFT basilar scarring again noted. There is no evidence of focal airspace disease, pulmonary edema, suspicious pulmonary nodule/mass, pleural effusion, or pneumothorax. A nondisplaced fracture of the anterior RIGHT 10th rib is present. IMPRESSION: Nondisplaced anterior RIGHT 10th rib fracture. No pleural effusion or pneumothorax. Electronically Signed   By: Margarette Canada M.D.   On: 03/20/2018 15:39   Dg Pelvis 1-2 Views  Result Date: 03/21/2018 CLINICAL DATA:  Trauma secondary  to a fall at home yesterday. EXAM: PELVIS - 1-2 VIEW COMPARISON:  CT scan dated 03/20/2018 FINDINGS: There is no evidence of pelvic fracture or diastasis. There are slight arthritic changes of both hips. Bowel gas pattern is normal. IMPRESSION: No acute abnormalities of the pelvis. Electronically Signed   By: Lorriane Shire M.D.   On: 03/21/2018 08:09   Ct Head Wo Contrast  Result Date: 03/21/2018 CLINICAL DATA:  Altered level of consciousness, history of alcohol abuse, hypertension, smoker EXAM: CT HEAD WITHOUT CONTRAST TECHNIQUE: Contiguous axial images were obtained from the base of the skull through the vertex without intravenous contrast. Sagittal and coronal MPR images reconstructed from axial data set. COMPARISON:  06/10/2015 FINDINGS: Brain: Generalized atrophy. Normal ventricular morphology. No midline shift or mass effect. Probable small vessel chronic ischemic changes of deep cerebral white matter. No intracranial hemorrhage, mass lesion, evidence of acute infarction, or extra-axial fluid collection. Vascular: Atherosclerotic calcifications of internal carotid arteries at skull base Skull: Intact Sinuses/Orbits: Frontal sinus osteoma unchanged. Paranasal sinuses and mastoid air cells otherwise clear. Other: N/A IMPRESSION: Atrophy with probable small vessel chronic ischemic changes of deep cerebral white matter. No acute intracranial abnormalities. Electronically Signed   By: Lavonia Dana M.D.   On: 03/21/2018 08:00   Ct Chest W Contrast  Result Date: 03/20/2018 CLINICAL DATA:  Right-sided rib pain after mechanical fall. EXAM: CT CHEST, ABDOMEN, AND PELVIS WITH CONTRAST TECHNIQUE: Multidetector CT imaging of the chest, abdomen and pelvis was performed following the standard protocol during bolus administration of intravenous contrast. CONTRAST:  170mL ISOVUE-300 IOPAMIDOL (ISOVUE-300) INJECTION 61% COMPARISON:  CT abdomen and pelvis 08/28/2016. MRI abdomen 08/30/2016. FINDINGS: CT CHEST FINDINGS  Cardiovascular: Normal caliber thoracic aorta. Scattered aortic calcifications. Great vessel origins are patent. Mild cardiac enlargement. No significant pericardial effusion. Coronary artery calcifications. Mediastinum/Nodes: Esophagus is decompressed. No significant lymphadenopathy in the chest. Lungs/Pleura: Scattered emphysematous changes in the lungs. Mild dependent atelectasis. Focal area consolidation in the right lung base could represent pneumonia possibly aspiration or contusion. No pleural effusions. No pneumothorax. Musculoskeletal: Degenerative changes in the spine. No vertebral compression deformities. Normal alignment. Sternum appears intact. Minimally displaced fracture of the right anterolateral tenth rib. CT ABDOMEN PELVIS FINDINGS Hepatobiliary: Large poorly defined hypoenhancing mass replacing the gallbladder and with infiltration into the adjacent liver involving segments 4 and 5. There is infiltration in the adjacent fat suggesting local spread. 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 Fat planes with the adjacent duodenum and splenic flexure of the colon or non distinct suggesting possible direct invasion. No bile duct dilatation. Pancreas: Unremarkable. No pancreatic ductal dilatation or surrounding inflammatory changes. Spleen: Normal in size without focal abnormality. Adrenals/Urinary Tract: Adrenal glands are unremarkable. Kidneys are normal, without renal calculi, focal lesion, or hydronephrosis. Bladder is unremarkable. Stomach/Bowel: Stomach, small bowel, and colon are mostly decompressed. Colonic diverticula without evidence of diverticulitis. Appendix is not identified. Vascular/Lymphatic: Calcification of the aorta. No aneurysm. No significant lymphadenopathy. Reproductive: Surgical absence of the gallbladder. Other: Free fluid in the pelvis may be reactive or malignant. No free  air. Abdominal wall musculature appears intact. Musculoskeletal: Degenerative changes in the spine and hips. Schmorl's node at L1. No destructive bone lesions. No acute fractures are identified. IMPRESSION: 1. Minimally displaced fracture of the right anterolateral tenth rib. 2. Focal consolidation in the right lung base could represent pneumonia, contusion, or aspiration. 3. 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 4. Free fluid in the pelvis may be reactive or malignant. 5. Colonic diverticulosis without evidence of diverticulitis. Aortic Atherosclerosis (ICD10-I70.0). Electronically Signed   By: Lucienne Capers M.D.   On: 03/20/2018 22:11   Ct Abdomen Pelvis W Contrast  Result Date: 03/20/2018 CLINICAL DATA:  Right-sided rib pain after mechanical fall. EXAM:  CT CHEST, ABDOMEN, AND PELVIS WITH CONTRAST TECHNIQUE: Multidetector CT imaging of the chest, abdomen and pelvis was performed following the standard protocol during bolus administration of intravenous contrast. CONTRAST:  178mL ISOVUE-300 IOPAMIDOL (ISOVUE-300) INJECTION 61% COMPARISON:  CT abdomen and pelvis 08/28/2016. MRI abdomen 08/30/2016. FINDINGS: CT CHEST FINDINGS Cardiovascular: Normal caliber thoracic aorta. Scattered aortic calcifications. Great vessel origins are patent. Mild cardiac enlargement. No significant pericardial effusion. Coronary artery calcifications. Mediastinum/Nodes: Esophagus is decompressed. No significant lymphadenopathy in the chest. Lungs/Pleura: Scattered emphysematous changes in the lungs. Mild dependent atelectasis. Focal area consolidation in the right lung base could represent pneumonia possibly aspiration or contusion. No pleural effusions. No pneumothorax. Musculoskeletal: Degenerative changes in the spine. No vertebral compression deformities. Normal alignment.  Sternum appears intact. Minimally displaced fracture of the right anterolateral tenth rib. CT ABDOMEN PELVIS FINDINGS Hepatobiliary: Large poorly defined hypoenhancing mass replacing the gallbladder and with infiltration into the adjacent liver involving segments 4 and 5. There is infiltration in the adjacent fat suggesting local spread. 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 Fat planes with the adjacent duodenum and splenic flexure of the colon or non distinct suggesting possible direct invasion. No bile duct dilatation. Pancreas: Unremarkable. No pancreatic ductal dilatation or surrounding inflammatory changes. Spleen: Normal in size without focal abnormality. Adrenals/Urinary Tract: Adrenal glands are unremarkable. Kidneys are normal, without renal calculi, focal lesion, or hydronephrosis. Bladder is unremarkable. Stomach/Bowel: Stomach, small bowel, and colon are mostly decompressed. Colonic diverticula without evidence of diverticulitis. Appendix is not identified. Vascular/Lymphatic: Calcification of the aorta. No aneurysm. No significant lymphadenopathy. Reproductive: Surgical absence of the gallbladder. Other: Free fluid in the pelvis may be reactive or malignant. No free air. Abdominal wall musculature appears intact. Musculoskeletal: Degenerative changes in the spine and hips. Schmorl's node at L1. No destructive bone lesions. No acute fractures are identified. IMPRESSION: 1. Minimally displaced fracture of the right anterolateral tenth rib. 2. Focal consolidation in the right lung base could represent pneumonia, contusion, or aspiration. 3. 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 4. Free fluid in the pelvis may be reactive or malignant. 5. Colonic  diverticulosis without evidence of diverticulitis. Aortic Atherosclerosis (ICD10-I70.0). Electronically Signed   By: Lucienne Capers M.D.   On: 03/20/2018 22:11   Mr Liver W Wo Contrast  Result Date: 03/22/2018 CLINICAL DATA:  Gallbladder mass, evaluate liver EXAM: MRI ABDOMEN WITHOUT AND WITH CONTRAST TECHNIQUE: Multiplanar multisequence MR imaging of the abdomen was performed both before and after the administration of intravenous contrast. CONTRAST:  6 mL Gadovist IV COMPARISON:  CT abdomen/pelvis dated 03/20/2018. MRI abdomen dated 08/30/2016. CT abdomen/pelvis dated 08/28/2016. FINDINGS: Motion degraded images. Lower chest: Small right and trace left pleural effusions. Hepatobiliary: Progressive gallbladder mass (worrisome for adenocarcinoma), now measuring 7.7 x 11.6 x 10.6, increased from 2018. Mass abuts liver and direct extension is difficult to exclude. Liver is otherwise within normal limits. No discrete/separate metastases. No intrahepatic or extrahepatic ductal dilatation. Pancreas:  Within normal limits. Spleen:  Within normal limits. Adrenals/Urinary Tract:  Adrenal glands are within normal limits. Kidneys are within normal limits.  No hydronephrosis. Stomach/Bowel: Stomach is notable for a tiny hiatal hernia. Visualized bowel is unremarkable. Vascular/Lymphatic:  No evidence of aneurysm. 16 mm short axis node in the porta hepatis (series 6/image 31), suspicious. Other:  No abdominal ascites.  Musculoskeletal: No focal osseous lesions. IMPRESSION: Motion degraded images. 11.6 cm gallbladder mass, progressive from 2018, worrisome for adenocarcinoma. Mass abuts the liver and direct extension is difficult to exclude. 16 cm short axis node in the porta hepatis, suspicious for nodal metastasis. Electronically Signed   By: Julian Hy M.D.   On: 03/22/2018 04:29   US Biopsy (liver)  Result Date: 03/24/2018 INDICATION: 78 year old female with a history of liver mass EXAM: ULTRASOUND-GUIDED BIOPSY  RIGHT LIVER MASS MEDICATIONS: None. ANESTHESIA/SEDATION: Moderate (conscious) sedation was employed during this procedure. A total of Versed 1.5 mg and Fentanyl 50 mcg was administered intravenously. Moderate Sedation Time: 10 minutes. The patient's level of consciousness and vital signs were monitored continuously by radiology nursing throughout the procedure under my direct supervision. FLUOROSCOPY TIME:  None COMPLICATIONS: None PROCEDURE: The procedure, risks, benefits, and alternatives were explained to the patient. Questions regarding the procedure were encouraged and answered. The patient understands and consents to the procedure. Ultrasound survey of the right liver lobe performed with images stored and sent to PACs. The right lower thorax/right upper abdomen was prepped with chlorhexidine in a sterile fashion, and a sterile drape was applied covering the operative field. A sterile gown and sterile gloves were used for the procedure. Local anesthesia was provided with 1% Lidocaine. Once the patient is prepped and draped sterilely and the skin and subcutaneous tissues were generously infiltrated with 1% lidocaine, a small stab incision was made with an 11 blade scalpel. A 17 gauge introducer needle was advanced under ultrasound guidance in an intercostal location into the right liver lobe, targeting the mass. The stylet was removed, and multiple 18 gauge core biopsy were retrieved. Samples were placed into formalin for transportation to the lab. Three separate Gel-Foam pledgets were then infused with a small amount of saline for assistance with hemostasis. The needle was removed, and a final ultrasound image was performed. The patient tolerated the procedure well and remained hemodynamically stable throughout. No complications were encountered and no significant blood loss was encounter. IMPRESSION: Status post ultrasound-guided biopsy of right liver mass. Tissue specimen sent to pathology for complete  histopathologic analysis. Signed, Dulcy Fanny. Dellia Nims, RPVI [facility] Electronically Signed   By: Corrie Mckusick D.O.   On: 03/24/2018 13:45    Note: This dictation was prepared with Dragon/digital dictation along with [employer]. Any transcriptional errors that result from this process are unintentional.   .Adin Hector, M.D., F.A.C.S. Gastrointestinal and Minimally Invasive Surgery [facility] [address], [place], [place] [address] [phone] Main / Paging  03/25/2018 9:03 AM

## 2018-03-25 NOTE — Progress Notes (Signed)
Ann Goodwin had a biopsy yesterday.  We will have to see what the results show.  She still is not convinced that she has cancer.  Her prealbumin is only 8.  This is a ominous prognostic factor for her.  I appreciate surgery's input.  They do not think that she is a candidate for any surgical intervention.  I would be reluctant to think that she is a candidate for systemic chemotherapy.  I do still think that she has a good performance status (ECOG 3 at best) to be able to handle chemotherapy.  She is had no lab work for 3 days.  She is having no issues with pain.  I am unsure when she really is eating.  On her physical exam, her vital signs show temperature of 98.  Pulse 62.  Blood pressure 104/83.  Her head neck exam shows no scleral icterus.  There is no ocular or oral lesions.  She has no thrush.  There is no adenopathy in the neck.  Lungs are clear bilaterally.  Cardiac exam regular rate and rhythm with no murmurs, rubs or bruits.  Abdomen is soft.  There is no guarding or rebound tenderness.  There is no fluid wave.  There is no probable liver or spleen tip.  Extremities shows no clubbing, cyanosis or edema.  Neurological exam is nonfocal.  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  She has had this for 2 years.  She has progressive disease.  She is never been on therapy.  Again, she had the biopsy yesterday.  The results should be out tomorrow.  From my perspective, she can certainly be treated and evaluated as an outpatient for therapy.  She will not be able to come to my office in [facility] and will have to be set up with Dr. Burr Medico who has seen her before.  She will be one happy to see her in our office upon discharge.   Lattie Haw, MD  Romans 5:3-5

## 2018-03-25 NOTE — Clinical Social Work Note (Signed)
Clinical Social Work Assessment  Patient Details  Name: Ann Goodwin MRN: [id number] Date of Birth: 04/18/1940  Date of referral:  03/25/18               Reason for consult:  Facility Placement, Discharge Planning                Permission sought to share information with:  Family Supports Permission granted to share information::  Yes, Verbal Permission Granted  Name::     Donald Cada   Agency::     Relationship::  son  Contact Information:  [phone]  Housing/Transportation Living arrangements for the past 2 months:  Single Family Home Source of Information:  Patient Patient Interpreter Needed:  None Criminal Activity/Legal Involvement Pertinent to Current Situation/Hospitalization:  No - Comment as needed Significant Relationships:  Adult Children Lives with:    Do you feel safe going back to the place where you live?  Yes Need for family participation in patient care:  Yes (Comment)  Care giving concerns:  No family at bedside. Patent stated she lives at home with two adult son. Patient stated one of her son in currently in a rehab facility but will return soon   Social Worker assessment / plan:  CSW met patient at bedside to offer support and discharge plan. Patient stated she lives at home with son and son is able to provide 24/hr care for her. Patient stated she is unsure if she wants rehab and needs time to think about. CSW will follow up with patient after lunch to get her decision  Employment status:  Retired Insurance information:  Medicare PT Recommendations:  Skilled Nursing Facility Information / Referral to community resources:  Skilled Nursing Facility  Patient/Family's Response to care:  Patient very pleasant   Patient/Family's Understanding of and Emotional Response to Diagnosis, Current Treatment, and Prognosis:  Patient unsure of her discharge plans   Emotional Assessment Appearance:  Appears stated age Attitude/Demeanor/Rapport:  Engaged Affect  (typically observed):  Accepting Orientation:  Oriented to Self, Oriented to Place, Oriented to Situation Alcohol / Substance use:  Not Applicable Psych involvement (Current and /or in the community):  No (Comment)  Discharge Needs  Concerns to be addressed:  Care Coordination Readmission within the last 30 days:  No Current discharge risk:  Dependent with Mobility Barriers to Discharge:  Continued Medical Work up   Ashley C Woods, LCSW 03/25/2018, 11:36 AM  

## 2018-03-25 NOTE — Progress Notes (Signed)
Clinical Social Worker following patient for support and discharge needs. CSW spoke with patient at bedside with patient's son Elenore Rota at bedside. Patient and son stated they would prefer patient to go home with home health. Son stated he will be able to check in on patient from time to time, but also stated that his little brother is home with patient so there is always someone with her. CSW made RNCM aware of family's decision. CSW signing off as patients social work needs have been met.   Rhea Pink, MSW,  [place]

## 2018-03-25 NOTE — Progress Notes (Signed)
[facility] Progress Note     Subjective: CC-  Sitting up in bed. States that she feels a little better than yesterday. Continues to have RUQ pain. Denies n/v. Tolerating diet but she is taking in very little.   S/p IR liver biopsy yesterday, results pending.  Objective: Vital signs in last 24 hours: Temp:  [98 F (36.7 C)-98.2 F (36.8 C)] 98 F (36.7 C) (02/04 0506) Pulse Rate:  [59-78] 62 (02/04 0506) Resp:  [18-20] 20 (02/04 0506) BP: (96-128)/(60-115) 104/83 (02/04 0506) SpO2:  [96 %-98 %] 96 % (02/04 0506) Last BM Date: 03/23/18  Intake/Output from previous day: 02/03 0701 - 02/04 0700 In: 480 [P.O.:480] Out: 800 [Urine:800] Intake/Output this shift: No intake/output data recorded.  PE: Gen:  Alert, NAD, pleasant HEENT: EOM's intact, pupils equal and round Pulm:  effort normal Abd: Soft, nondistended, +BS, no HSM, TTP RUQ Ext:  Calves soft and nontender Skin: no rashes noted, warm and dry   Lab Results:  Recent Labs    03/25/18 0759  WBC 7.5  HGB 12.4  HCT 39.7  PLT 275   BMET Recent Labs    03/25/18 0759  NA 134*  K 3.9  CL 103  CO2 24  GLUCOSE 96  BUN 16  CREATININE 0.61  CALCIUM 8.5*   PT/INR No results for input(s): LABPROT, INR in the last 72 hours. CMP     Component Value Date/Time   NA 134 (L) 03/25/2018 0759   K 3.9 03/25/2018 0759   CL 103 03/25/2018 0759   CO2 24 03/25/2018 0759   GLUCOSE 96 03/25/2018 0759   BUN 16 03/25/2018 0759   CREATININE 0.61 03/25/2018 0759   CALCIUM 8.5 (L) 03/25/2018 0759   PROT 8.4 (H) 03/25/2018 0759   ALBUMIN 2.6 (L) 03/25/2018 0759   AST 19 03/25/2018 0759   ALT 11 03/25/2018 0759   ALKPHOS 61 03/25/2018 0759   BILITOT 0.7 03/25/2018 0759   GFRNONAA >60 03/25/2018 0759   GFRAA >60 03/25/2018 0759   Lipase     Component Value Date/Time   LIPASE 14 08/25/2016 2042       Studies/Results: US Biopsy (liver)  Result Date: 03/24/2018 INDICATION: 78 year old female with a  history of liver mass EXAM: ULTRASOUND-GUIDED BIOPSY RIGHT LIVER MASS MEDICATIONS: None. ANESTHESIA/SEDATION: Moderate (conscious) sedation was employed during this procedure. A total of Versed 1.5 mg and Fentanyl 50 mcg was administered intravenously. Moderate Sedation Time: 10 minutes. The patient's level of consciousness and vital signs were monitored continuously by radiology nursing throughout the procedure under my direct supervision. FLUOROSCOPY TIME:  None COMPLICATIONS: None PROCEDURE: The procedure, risks, benefits, and alternatives were explained to the patient. Questions regarding the procedure were encouraged and answered. The patient understands and consents to the procedure. Ultrasound survey of the right liver lobe performed with images stored and sent to PACs. The right lower thorax/right upper abdomen was prepped with chlorhexidine in a sterile fashion, and a sterile drape was applied covering the operative field. A sterile gown and sterile gloves were used for the procedure. Local anesthesia was provided with 1% Lidocaine. Once the patient is prepped and draped sterilely and the skin and subcutaneous tissues were generously infiltrated with 1% lidocaine, a small stab incision was made with an 11 blade scalpel. A 17 gauge introducer needle was advanced under ultrasound guidance in an intercostal location into the right liver lobe, targeting the mass. The stylet was removed, and multiple 18 gauge core biopsy were retrieved. Samples were  placed into formalin for transportation to the lab. Three separate Gel-Foam pledgets were then infused with a small amount of saline for assistance with hemostasis. The needle was removed, and a final ultrasound image was performed. The patient tolerated the procedure well and remained hemodynamically stable throughout. No complications were encountered and no significant blood loss was encounter. IMPRESSION: Status post ultrasound-guided biopsy of right liver mass.  Tissue specimen sent to pathology for complete histopathologic analysis. Signed, Dulcy Fanny. Dellia Nims, RPVI [facility] Radiology Electronically Signed   By: Corrie Mckusick D.O.   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 Mass abuts the liver and direct extension is difficult to exclude. - s/p liver biopsy in IR 2/3, results pending - Per Dr. Marin Olp "I would be reluctant to think that she is a candidate for systemic chemotherapy.  I do still think that she has a good performance status (ECOG 3 at best) to be able to handle chemotherapy." - Case was discussed with Dr. Barry Dienes. This may be surgically resectable, but it would likely require a very extensive surgery. Glori Luis appears clear but gallbladder might be locally invasive into the colon, therefore surgery cound include right colon and large liver resection. With her age, other health issues, and malnutrition this may be especially hard on her. Will need to confirm oncology's recommendations on chemotherapy, but if she is not a candidate for chemo then she is not a candidate for surgery. Ms. Rizor also tells me this morning that she may not even want a surgery if it is indicated.   ID - none  FEN - reg diet VTE - SCDs, ok for chemical DVT prophylaxis from surgical standpoint  Foley - none Follow up - TBD   LOS: 4 days    Wellington Hampshire , [facility] Surgery 03/25/2018, 9:33 AM Pager: [phone] Mon-Thurs 7:00 am-4:30 pm Fri 7:00 am -11:30 AM Sat-Sun 7:00 am-11:30 am

## 2018-03-25 NOTE — Care Management Note (Signed)
Case Management Note  Patient Details  Name: Ann Goodwin MRN: [id number] Date of Birth: 1941-01-27  Subjective/Objective: Recc to talk to patient/son per CSW-PT recc SNFpatient/son declined SNF;HHC requested-Son chose [facility]-rep Merril Abbe HHRN/PT/OT/aide/sw,rw-will deliver to rm prior d/c. Son will transport home on own.No further CM needs.                   Action/Plan:dc home w/HHC/dme   Expected Discharge Date:  (unknown)               Expected Discharge Plan:  [place]  In-House Referral:     Discharge planning Services  CM Consult  Post Acute Care Choice:    Choice offered to:  Adult Children  DME Arranged:  [facility] DME Agency:  [place] Arranged:  RN, PT, OT, Nurse's Aide, Social Work [employer] Agency:  [place]  Status of Service:  Completed, signed off  If discussed at [employer], dates discussed:    Additional Comments:  Dessa Phi, RN 03/25/2018, 3:16 PM

## 2018-03-25 NOTE — Progress Notes (Addendum)
PROGRESS NOTE    Ann Goodwin  SEG:[id number] DOB: [birth year] DOA: 03/20/2018 PCP: Ann Mccreedy, MD     Brief Narrative:  Ann Goodwin a 78 y.o.femalewithhistory of alcohol abuse comes in for a fall.  She underwent CT chest and was found to have  Right 10 th rib fracture and possible contusion.  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   Assessment & Plan:   Active Problems:   Right pulmonary contusion   Closed fracture of one rib of right side   Normochromic normocytic anemia   Pulmonary contusion   Alcohol abuse   Minimally displaced fracture of the right anterolateral tenth rib   Gallbladder cancer (HCC)  Multiple falls with right Pulmonary contusion and right rib fractures: Continue with pulm toilet and incentive spirometer. Pt had some decreased air entry on the right and some coughing. Repeat CXR is wnl.  PT eval recommending SNF.    Progression of GB carcinoma: Oncology consulted and MRI of the liver ordered and independently reviewed.  IR consult for biopsy, which was done yesterday (03/24/2018).  Per surgery she is not a good candidate for resection.  -Oncology following.  Appreciate help. -Per oncology patient can be evaluated as an outpatient for therapy.   Alcohol abuse:  - watch for withdrawal symptoms. No signs of withdrawal.    Anemia of chronic disease:  Transfuse to keep hemoglobin greater than 7.     DVT prophylaxis: scd (biopsy done yesterday). If patient requiring prolonged admission consider starting pharmacologic prophylaxis. Code Status: full code.  Family Communication: Discussed with son Ann Goodwin over the phone. Please contact him at [phone] once biopsy results available. Disposition Plan: PT recommending SNF    Consultants:   Oncology  IR   Surgery.   Procedures: liver biopsy (03/24/2018).  Antimicrobials: None.   Subjective: C/o some abdominal pain s/p liver biopsy.    Objective: Vitals:   03/24/18 1325 03/24/18 1415 03/24/18 2242 03/25/18 0506  BP: 96/60 109/67 106/65 104/83  Pulse: 78 63 (!) 59 62  Resp: 20 20 20 20   Temp:  98.2 F (36.8 C) 98.1 F (36.7 C) 98 F (36.7 C)  TempSrc:  Oral Oral Oral  SpO2: 98% 97% 98% 96%  Weight:      Height:        Intake/Output Summary (Last 24 hours) at 03/25/2018 1126 Last data filed at 03/25/2018 1000 Gross per 24 hour  Intake 1080 ml  Output 800 ml  Net 280 ml   Filed Weights   03/21/18 1455  Weight: 65.8 kg    Examination:  General exam: Appears calm and comfortable. Not in distress.  Respiratory system: diminished air entry on the right greater than left. No wheezing heard.  Cardiovascular system: S1 & S2 heard, RRR.  Gastrointestinal system: Abdomen is soft , mildly tender in the right upper quadrant. Bowel sounds good.  Central nervous system: Alert and oriented to place and person.  Extremities: no pedal edema .  Skin: No rashes, lesions or ulcers Psychiatry:  Mood & affect appropriate.     Data Reviewed: I have personally reviewed following labs and imaging studies  CBC: Recent Labs  Lab 03/20/18 1754 03/21/18 0641 03/22/18 0519 03/25/18 0759  WBC 9.2 7.6 8.3 7.5  NEUTROABS 6.0  --  5.9 5.2  HGB 11.1* 11.8* 12.5 12.4  HCT 34.7* 37.7 39.3 39.7  MCV 96.9 97.7 94.9 98.0  PLT 268 250 269 854   Basic Metabolic Panel: Recent  Labs  Lab 03/20/18 1754 03/21/18 0701 03/21/18 1042 03/22/18 0519 03/25/18 0759  NA 136 140 140 135 134*  K 3.2* 3.7 3.7 3.7 3.9  CL 102 112* 105 101 103  CO2 27 24 29 26 24   GLUCOSE 99 88 123* 99 96  BUN 8 7* 9 6* 16  CREATININE 0.59 0.47 0.58 0.47 0.61  CALCIUM 8.5* 7.6* 8.5* 8.5* 8.5*   GFR: Estimated Creatinine Clearance: 56.9 mL/min (by C-G formula based on SCr of 0.61 mg/dL). Liver Function Tests: Recent Labs  Lab 03/20/18 1754 03/21/18 1042 03/22/18 0519 03/25/18 0759  AST 19 26 21 19   ALT 11 12 12 11   ALKPHOS 69 66 67 61   BILITOT 0.8 0.8 1.2 0.7  PROT 8.5* 8.2* 8.3* 8.4*  ALBUMIN 2.9* 2.7* 2.8* 2.6*   No results for input(s): LIPASE, AMYLASE in the last 168 hours. No results for input(s): AMMONIA in the last 168 hours. Coagulation Profile: No results for input(s): INR, PROTIME in the last 168 hours. Cardiac Enzymes: No results for input(s): CKTOTAL, CKMB, CKMBINDEX, TROPONINI in the last 168 hours. BNP (last 3 results) No results for input(s): PROBNP in the last 8760 hours. HbA1C: No results for input(s): HGBA1C in the last 72 hours. CBG: No results for input(s): GLUCAP in the last 168 hours. Lipid Profile: No results for input(s): CHOL, HDL, LDLCALC, TRIG, CHOLHDL, LDLDIRECT in the last 72 hours. Thyroid Function Tests: No results for input(s): TSH, T4TOTAL, FREET4, T3FREE, THYROIDAB in the last 72 hours. Anemia Panel: No results for input(s): VITAMINB12, FOLATE, FERRITIN, TIBC, IRON, RETICCTPCT in the last 72 hours. Sepsis Labs: No results for input(s): PROCALCITON, LATICACIDVEN in the last 168 hours.  No results found for this or any previous visit (from the past 240 hour(s)).       Radiology Studies: US Biopsy (liver)  Result Date: 03/24/2018 INDICATION: 78 year old female with a history of liver mass EXAM: ULTRASOUND-GUIDED BIOPSY RIGHT LIVER MASS MEDICATIONS: None. ANESTHESIA/SEDATION: Moderate (conscious) sedation was employed during this procedure. A total of Versed 1.5 mg and Fentanyl 50 mcg was administered intravenously. Moderate Sedation Time: 10 minutes. The patient's level of consciousness and vital signs were monitored continuously by radiology nursing throughout the procedure under my direct supervision. FLUOROSCOPY TIME:  None COMPLICATIONS: None PROCEDURE: The procedure, risks, benefits, and alternatives were explained to the patient. Questions regarding the procedure were encouraged and answered. The patient understands and consents to the procedure. Ultrasound survey of the right  liver lobe performed with images stored and sent to PACs. The right lower thorax/right upper abdomen was prepped with chlorhexidine in a sterile fashion, and a sterile drape was applied covering the operative field. A sterile gown and sterile gloves were used for the procedure. Local anesthesia was provided with 1% Lidocaine. Once the patient is prepped and draped sterilely and the skin and subcutaneous tissues were generously infiltrated with 1% lidocaine, a small stab incision was made with an 11 blade scalpel. A 17 gauge introducer needle was advanced under ultrasound guidance in an intercostal location into the right liver lobe, targeting the mass. The stylet was removed, and multiple 18 gauge core biopsy were retrieved. Samples were placed into formalin for transportation to the lab. Three separate Gel-Foam pledgets were then infused with a small amount of saline for assistance with hemostasis. The needle was removed, and a final ultrasound image was performed. The patient tolerated the procedure well and remained hemodynamically stable throughout. No complications were encountered and no significant blood loss was  encounter. IMPRESSION: Status post ultrasound-guided biopsy of right liver mass. Tissue specimen sent to pathology for complete histopathologic analysis. Signed, Dulcy Fanny. Dellia Nims, RPVI [facility] Radiology Electronically Signed   By: Corrie Mckusick D.O.   On: 03/24/2018 13:45        Scheduled Meds: . lidocaine  1 patch Transdermal Q24H  . thiamine  100 mg Oral Daily   Continuous Infusions:   LOS: 4 days    Time spent: 25 min    Ann Guthrie, MD [facility] Pager on amion  If 7PM-7AM, please contact night-coverage [email or web address] Password [email or web address] 03/25/2018, 11:26 AM

## 2018-03-25 NOTE — NC FL2 (Signed)
[place] LEVEL OF CARE SCREENING TOOL     IDENTIFICATION  Patient Name: Ann Goodwin Birthdate: 1940/07/18 Sex: female Admission Date (Current Location): 03/20/2018  [facility] and [place] Number:  Herbalist and Address:  [facility],  [place] [address] [place]      Provider Number: [id number]  Attending Physician Name and Address:  Yaakov Guthrie, MD  Relative Name and Phone Number:  Elenore Rota WGNFAOZ,[phone]    Current Level of Care: Hospital Recommended Level of Care: [place] Prior Approval Number:    Date Approved/Denied:   PASRR Number: [id number]  Discharge Plan: SNF    Current Diagnoses: Patient Active Problem List   Diagnosis Date Noted  . Minimally displaced fracture of the right anterolateral tenth rib 03/22/2018  . Gallbladder cancer ([place]) 03/22/2018  . Alcohol abuse   . Closed fracture of one rib of right side 03/21/2018  . Normochromic normocytic anemia 03/21/2018  . Pulmonary contusion 03/21/2018  . Right pulmonary contusion 03/20/2018  . Dilated aortic root ([place]) 08/28/2016  . Infestation by bed bug 08/28/2016  . Tobacco abuse 08/28/2016  . Acute pain of left shoulder   . Elevated troponin   . Cellulitis 11/02/2015  . Hypertension 11/02/2015  . Alcohol dependence ([place]) 11/02/2015  . Hypokalemia 11/02/2015  . 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 Chest pain 06/01/2013    Orientation RESPIRATION BLADDER Height & Weight     Self, Situation, Place  O2(2L) Continent Weight: 145 lb 1 oz (65.8 kg) Height:  5\' 6"  (167.6 cm)  BEHAVIORAL SYMPTOMS/MOOD NEUROLOGICAL BOWEL NUTRITION STATUS      Continent Diet(regular)  AMBULATORY STATUS COMMUNICATION OF NEEDS Skin   Limited Assist Verbally Surgical wounds                       Personal Care Assistance Level of Assistance  Bathing, Feeding, Dressing Bathing Assistance: Limited assistance Feeding assistance:  Independent Dressing Assistance: Limited assistance     Functional Limitations Info  Sight, Hearing, Speech Sight Info: Adequate Hearing Info: Adequate Speech Info: Adequate    SPECIAL CARE FACTORS FREQUENCY  PT (By licensed PT), OT (By licensed OT)     PT Frequency: 5x wk OT Frequency: 5x wk            Contractures Contractures Info: Not present    Additional Factors Info  Code Status, Allergies Code Status Info: full code Allergies Info: nka           Current Medications (03/25/2018):  This is the current hospital active medication list Current Facility-Administered Medications  Medication Dose Route Frequency Provider Last Rate Last Dose  . acetaminophen (TYLENOL) tablet 650 mg  650 mg Oral Q6H PRN Rise Patience, MD   650 mg at 03/24/18 1749   Or  . acetaminophen (TYLENOL) suppository 650 mg  650 mg Rectal Q6H PRN Rise Patience, MD      . lidocaine (LIDODERM) 5 % 1 patch  1 patch Transdermal Q24H Rise Patience, MD   1 patch at 03/24/18 1747  . ondansetron (ZOFRAN) tablet 4 mg  4 mg Oral Q6H PRN Rise Patience, MD       Or  . ondansetron [facility]) injection 4 mg  4 mg Intravenous Q6H PRN Rise Patience, MD      . thiamine (VITAMIN B-1) tablet 100 mg  100 mg Oral Daily Rise Patience, MD   100 mg at 03/25/18 [phone]  Discharge Medications: Please see discharge summary for a list of discharge medications.  Relevant Imaging Results:  Relevant Lab Results:   Additional Information SS# [id number]  Wende Neighbors, LCSW

## 2018-03-26 ENCOUNTER — Telehealth: Payer: Self-pay

## 2018-03-26 DIAGNOSIS — S27321A Contusion of lung, unilateral, initial encounter: Secondary | ICD-10-CM

## 2018-03-26 DIAGNOSIS — F1010 Alcohol abuse, uncomplicated: Secondary | ICD-10-CM

## 2018-03-26 DIAGNOSIS — C23 Malignant neoplasm of gallbladder: Principal | ICD-10-CM

## 2018-03-26 DIAGNOSIS — C799 Secondary malignant neoplasm of unspecified site: Secondary | ICD-10-CM

## 2018-03-26 DIAGNOSIS — S2231XD Fracture of one rib, right side, subsequent encounter for fracture with routine healing: Secondary | ICD-10-CM

## 2018-03-26 DIAGNOSIS — D649 Anemia, unspecified: Secondary | ICD-10-CM

## 2018-03-26 MED ORDER — IBUPROFEN 600 MG PO TABS: 600.0000 mg | ORAL_TABLET | Freq: Four times a day (QID) | ORAL | 0 refills | Status: AC | PRN

## 2018-03-26 NOTE — Progress Notes (Signed)
Physical Therapy Treatment Patient Details Name: Ann Goodwin MRN: [id number] DOB: 12-Mar-1940 Today's Date: 03/26/2018    History of Present Illness Pt s/p fall with pumonary contusion and R 10th rib fx. 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  Pt with hx of ETOH abuse and chronic edema    PT Comments    Progressing with mobility. Pt was able to tolerate short distance ambulation in hallway on today. Cues required for safe RW use. She continues to c/o R flank and abd pain. She has declined SNF placement. She plans to d/c home with her son. Will recommend HHPT f/u.     Follow Up Recommendations  Home health PT;Supervision/Assistance - 24 hour     Equipment Recommendations  Rolling walker with 5" wheels    Recommendations for Other Services       Precautions / Restrictions Precautions Precautions: Fall Restrictions Weight Bearing Restrictions: No    Mobility  Bed Mobility               General bed mobility comments: oob in recliner  Transfers Overall transfer level: Needs assistance Equipment used: Rolling walker (2 wheeled) Transfers: Sit to/from Stand Sit to Stand: Min guard         General transfer comment: Close guard for safety. VCs safety, hand placement. Increased time.   Ambulation/Gait Ambulation/Gait assistance: Min guard Gait Distance (Feet): 60 Feet Assistive device: Rolling walker (2 wheeled) Gait Pattern/deviations: Step-through pattern;Decreased stride length     General Gait Details: Cues for safety, distance from RW, proper use of RW. Close guard for safety. Pt tolerated distance well. Some intermittent bumping into objects in environment   Stairs             Wheelchair Mobility    Modified Rankin (Stroke Patients Only)       Balance Overall balance assessment: Needs assistance           Standing balance-Leahy Scale: Fair                              Cognition  Arousal/Alertness: Awake/alert Behavior During Therapy: WFL for tasks assessed/performed Overall Cognitive Status: Within Functional Limits for tasks assessed                                        Exercises      General Comments        Pertinent Vitals/Pain Pain Assessment: Faces Faces Pain Scale: Hurts little more Pain Location: R ribs with movement or coughing; also lower abd pain Pain Descriptors / Indicators: Discomfort;Grimacing;Sore Pain Intervention(s): Monitored during session;Repositioned    Home Living                      Prior Function            PT Goals (current goals can now be found in the care plan section) Progress towards PT goals: Progressing toward goals    Frequency    Min 3X/week      PT Plan Current plan remains appropriate    Co-evaluation              AM-PAC PT "6 Clicks" Mobility   Outcome Measure  Help needed turning from your back to your side while in a flat bed without using bedrails?: A Little Help needed moving from  lying on your back to sitting on the side of a flat bed without using bedrails?: A Little Help needed moving to and from a bed to a chair (including a wheelchair)?: A Little Help needed standing up from a chair using your arms (e.g., wheelchair or bedside chair)?: A Little Help needed to walk in hospital room?: A Little Help needed climbing 3-5 steps with a railing? : A Little 6 Click Score: 18    End of Session Equipment Utilized During Treatment: Gait belt Activity Tolerance: Patient tolerated treatment well Patient left: in chair;with call bell/phone within reach;with chair alarm set;with family/visitor present   PT Visit Diagnosis: Unsteadiness on feet (R26.81);History of falling (Z91.81);Difficulty in walking, not elsewhere classified (R26.2);Muscle weakness (generalized) (M62.81)     Time: 1914-7829 PT Time Calculation (min) (ACUTE ONLY): 11 min  Charges:  $Gait Training:  8-22 mins                        Weston Anna, PT Acute Rehabilitation Services Pager: [phone] Office: [phone]

## 2018-03-26 NOTE — Telephone Encounter (Signed)
Faxed referral to [facility] for hospice referral along with demographics and hospital note by Dr. Burr Medico.

## 2018-03-26 NOTE — Progress Notes (Addendum)
[facility] Progress Note     Subjective: CC-  Patient states that she just wants to go home. Continues to have some RUQ pain. Denies n/v. States that she ate nearly all of her breakfast this morning.  Objective: Vital signs in last 24 hours: Temp:  [98.2 F (36.8 C)-98.6 F (37 C)] 98.2 F (36.8 C) (02/05 0445) Pulse Rate:  [56-68] 57 (02/05 0445) Resp:  [19-20] 20 (02/05 0445) BP: (112-124)/(68-72) 118/72 (02/05 0445) SpO2:  [97 %-100 %] 100 % (02/05 0445) Last BM Date: 03/23/18  Intake/Output from previous day: 02/04 0701 - 02/05 0700 In: 720 [P.O.:720] Out: 801 [Urine:800; Stool:1] Intake/Output this shift: No intake/output data recorded.  PE: Gen: Alert, NAD, pleasant HEENT: EOM's intact, pupils equal and round Pulm:effort normal Abd: Soft,nondistended, +BS, no HSM,TTP RUQ YWV:PXTGGY soft and nontender Skin: no rashes noted, warm and dry  Lab Results:  Recent Labs    03/25/18 0759  WBC 7.5  HGB 12.4  HCT 39.7  PLT 275   BMET Recent Labs    03/25/18 0759  NA 134*  K 3.9  CL 103  CO2 24  GLUCOSE 96  BUN 16  CREATININE 0.61  CALCIUM 8.5*   PT/INR No results for input(s): LABPROT, INR in the last 72 hours. CMP     Component Value Date/Time   NA 134 (L) 03/25/2018 0759   K 3.9 03/25/2018 0759   CL 103 03/25/2018 0759   CO2 24 03/25/2018 0759   GLUCOSE 96 03/25/2018 0759   BUN 16 03/25/2018 0759   CREATININE 0.61 03/25/2018 0759   CALCIUM 8.5 (L) 03/25/2018 0759   PROT 8.4 (H) 03/25/2018 0759   ALBUMIN 2.6 (L) 03/25/2018 0759   AST 19 03/25/2018 0759   ALT 11 03/25/2018 0759   ALKPHOS 61 03/25/2018 0759   BILITOT 0.7 03/25/2018 0759   GFRNONAA >60 03/25/2018 0759   GFRAA >60 03/25/2018 0759   Lipase     Component Value Date/Time   LIPASE 14 08/25/2016 2042       Studies/Results: US Biopsy (liver)  Result Date: 03/24/2018 INDICATION: 78 year old female with a history of liver mass EXAM: ULTRASOUND-GUIDED  BIOPSY RIGHT LIVER MASS MEDICATIONS: None. ANESTHESIA/SEDATION: Moderate (conscious) sedation was employed during this procedure. A total of Versed 1.5 mg and Fentanyl 50 mcg was administered intravenously. Moderate Sedation Time: 10 minutes. The patient's level of consciousness and vital signs were monitored continuously by radiology nursing throughout the procedure under my direct supervision. FLUOROSCOPY TIME:  None COMPLICATIONS: None PROCEDURE: The procedure, risks, benefits, and alternatives were explained to the patient. Questions regarding the procedure were encouraged and answered. The patient understands and consents to the procedure. Ultrasound survey of the right liver lobe performed with images stored and sent to PACs. The right lower thorax/right upper abdomen was prepped with chlorhexidine in a sterile fashion, and a sterile drape was applied covering the operative field. A sterile gown and sterile gloves were used for the procedure. Local anesthesia was provided with 1% Lidocaine. Once the patient is prepped and draped sterilely and the skin and subcutaneous tissues were generously infiltrated with 1% lidocaine, a small stab incision was made with an 11 blade scalpel. A 17 gauge introducer needle was advanced under ultrasound guidance in an intercostal location into the right liver lobe, targeting the mass. The stylet was removed, and multiple 18 gauge core biopsy were retrieved. Samples were placed into formalin for transportation to the lab. Three separate Gel-Foam pledgets were then infused with a  small amount of saline for assistance with hemostasis. The needle was removed, and a final ultrasound image was performed. The patient tolerated the procedure well and remained hemodynamically stable throughout. No complications were encountered and no significant blood loss was encounter. IMPRESSION: Status post ultrasound-guided biopsy of right liver mass. Tissue specimen sent to pathology for complete  histopathologic analysis. Signed, Dulcy Fanny. Dellia Nims, RPVI [facility] Electronically Signed   By: Corrie Mckusick D.O.   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 Mass abuts the liver and direct extension is difficult to exclude. - s/p liver biopsy in IR 2/3, results pending - Per Dr. Marin Olp "I would be reluctant to think that she is a candidate for systemic chemotherapy. I do still think that she has a good performance status (ECOG 3 at best) to be able to handle chemotherapy." - Case was discussed with Dr. Barry Dienes. This may be surgically resectable, but it would likely require a very extensive surgery. Glori Luis appears clear but gallbladder might be locally invasive into the colon, therefore surgery cound include right colon and large liver resection. With her age, other health issues, and malnutrition this may be especially hard on her. Will need to confirm oncology's recommendations on chemotherapy, but if she is not a candidate for chemo then she is not a candidate for surgery.  ID -none FEN -reg diet VTE -SCDs, ok for chemical DVT prophylaxis from surgical standpoint  Foley -none Follow up -TBD  Plan - IR biopsy pending. Patient was discussed at tumor board this morning and felt not to be a surgical candidate. Recommend follow up with Dr. Burr Medico. General surgery will sign off, please call with concerns.   LOS: 5 days    Wellington Hampshire , [facility] 03/26/2018, 9:45 AM Pager: [phone] Mon-Thurs 7:00 am-4:30 pm Fri 7:00 am -11:30 AM Sat-Sun 7:00 am-11:30 am

## 2018-03-26 NOTE — Progress Notes (Signed)
Ann Goodwin   DOB:1940-07-08   KD#:[id number]   KDX#:[id number]  Oncology follow up   Subjective: Pt was seen by me in 2018 when she was diagnosed with gallbladder cancer, and lost follow-up.  She was admitted to hospital after an episode of dizziness and a fall at home, and imaging studies showed cancer progression.  She underwent a liver biopsy 2 days ago.  She seems to be comfortable, has intermittent right upper quadrant pain, controlled, no other complaints.  She states she is able to function at home by herself, she lives with one of her sons. I spoke with her son Ann Goodwin at bedside also today.    Objective:  Vitals:   03/26/18 0445 03/26/18 1313  BP: 118/72 121/70  Pulse: (!) 57 66  Resp: 20 18  Temp: 98.2 F (36.8 C) 98.9 F (37.2 C)  SpO2: 100% 96%    Body mass index is 23.41 kg/m.  Intake/Output Summary (Last 24 hours) at 03/26/2018 1423 Last data filed at 03/26/2018 1314 Gross per 24 hour  Intake 840 ml  Output 400 ml  Net 440 ml     Sclerae unicteric  Oropharynx clear  No peripheral adenopathy  Lungs clear -- no rales or rhonchi  Heart regular rate and rhythm  Abdomen soft  MSK no focal spinal tenderness, no peripheral edema  Neuro nonfocal    CBG (last 3)  No results for input(s): GLUCAP in the last 72 hours.   Labs:  Lab Results  Component Value Date   WBC 7.5 03/25/2018   HGB 12.4 03/25/2018   HCT 39.7 03/25/2018   MCV 98.0 03/25/2018   PLT 275 03/25/2018   NEUTROABS 5.2 03/25/2018    Urine Studies No results for input(s): UHGB, CRYS in the last 72 hours.  Invalid input(s): UACOL, UAPR, USPG, UPH, UTP, UGL, UKET, UBIL, UNIT, UROB, [place], UEPI, UWBC, Duwayne Heck [place], [place]  Basic Metabolic Panel: Recent Labs  Lab 03/20/18 1754 03/21/18 0701 03/21/18 1042 03/22/18 0519 03/25/18 0759  NA 136 140 140 135 134*  K 3.2* 3.7 3.7 3.7 3.9  CL 102 112* 105 101 103  CO2 27 24 29 26 24   GLUCOSE 99 88 123* 99 96  BUN 8 7* 9 6* 16  CREATININE  0.59 0.47 0.58 0.47 0.61  CALCIUM 8.5* 7.6* 8.5* 8.5* 8.5*  MG  --   --   --   --  1.7   GFR Estimated Creatinine Clearance: 56.9 mL/min (by C-G formula based on SCr of 0.61 mg/dL). Liver Function Tests: Recent Labs  Lab 03/20/18 1754 03/21/18 1042 03/22/18 0519 03/25/18 0759  AST 19 26 21 19   ALT 11 12 12 11   ALKPHOS 69 66 67 61  BILITOT 0.8 0.8 1.2 0.7  PROT 8.5* 8.2* 8.3* 8.4*  ALBUMIN 2.9* 2.7* 2.8* 2.6*   No results for input(s): LIPASE, AMYLASE in the last 168 hours. No results for input(s): AMMONIA in the last 168 hours. Coagulation profile No results for input(s): INR, PROTIME in the last 168 hours.  CBC: Recent Labs  Lab 03/20/18 1754 03/21/18 0641 03/22/18 0519 03/25/18 0759  WBC 9.2 7.6 8.3 7.5  NEUTROABS 6.0  --  5.9 5.2  HGB 11.1* 11.8* 12.5 12.4  HCT 34.7* 37.7 39.3 39.7  MCV 96.9 97.7 94.9 98.0  PLT 268 250 269 275   Cardiac Enzymes: No results for input(s): CKTOTAL, CKMB, CKMBINDEX, TROPONINI in the last 168 hours. BNP: Invalid input(s): POCBNP CBG: No results for input(s): GLUCAP  in the last 168 hours. D-Dimer No results for input(s): DDIMER in the last 72 hours. Hgb A1c No results for input(s): HGBA1C in the last 72 hours. Lipid Profile No results for input(s): CHOL, HDL, LDLCALC, TRIG, CHOLHDL, LDLDIRECT in the last 72 hours. Thyroid function studies No results for input(s): TSH, T4TOTAL, T3FREE, THYROIDAB in the last 72 hours.  Invalid input(s): FREET3 Anemia work up No results for input(s): VITAMINB12, FOLATE, FERRITIN, TIBC, IRON, RETICCTPCT in the last 72 hours. Microbiology No results found for this or any previous visit (from the past 240 hour(s)).    Studies:  No results found.  Assessment: 78 y.o. 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  1.  Gallbladder mass with liver invasion, highly suspicious for gallbladder cancer. 2.   History of alcohol abuse 3.  Fall at home 4, right 10th rib fracture  Plan:  -Her initial gallbladder mass was found in July 2018, I saw her and she was referred to see surgeon Dr. Barry Dienes, but lost follow-up, and did not pursue any treatment. She even does not remember those visits and the scan findings. -I reviewed her recent CT scan findings, which unfortunately showed growth of the gallbladder mass, with possible liver invasion and the lymph node metastasis. -She underwent a liver biopsy 2 days ago, I spoke with pathologist Dr. 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 -Her case was reviewed and discussed in our GI tumor board this morning, Dr. Barry Dienes does not think she is a candidate for surgical resection, which is a major surgery, due to her advanced age, alcohol abuse and comorbidities.  Patient is not interested in surgery anyway. -I discussed the role of systemic chemotherapy versus palliative care alone with hospice.  The typical chemotherapy for gallbladder cancer is cisplatin and gemcitabine, due to her advanced age, alcohol abuse, poor nutrition, she is not a good candidate for chemotherapy.  Her tumor has been growing slowly, she did not have many symptoms related to that, I think hospice and palliative care is a better option for her.  I discussed with her and her son Ann Goodwin in length, and both of them agree with hospice. Will refer her  -she is OK to discharge from oncology stand point.    Truitt Merle, MD 03/26/2018  2:23 PM

## 2018-03-26 NOTE — Discharge Summary (Signed)
Physician Discharge Summary  Ann Goodwin BTD:[id number] DOB: 1941-01-24 DOA: 03/20/2018  PCP: Benito Mccreedy, MD  Admit date: 03/20/2018 Discharge date: 03/26/2018  Admitted From: Home Disposition: Home   Recommendations for Outpatient Follow-up:  1. Follow up with PCP in 1-2 weeks 2. Follow up with oncology, Dr. Burr Medico, soon following discharge to continue discussions of possible treatments. Per oncology, at this time the patient would not desire chemotherapy and per surgery is not a current candidate for the significant surgery that would be required for resection.  3. Follow up final surgical pathology results, pending at discharge.   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 Pain control was achieved and she was discharged home in stable condition after discussions with surgery and oncology as below.  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 CT showing growth of mass with possible liver invasion and LN mets.  - Preliminary pathology report per Dr. Tammi Klippel of liver biopsy from 2/3 is adenocarcinoma, suspected GB primary.  - Discussed at GI tumor board 2/5. Dr. Bradd Burner doesn't believe she's a surgical candidate due to  78 advanced age, alcohol abuse, and comorbidities.  - Per Dr. Burr Medico who met with the patient: "I discussed the role of systemic chemotherapy versus palliative care alone with hospice.  The typical chemotherapy for gallbladder cancer is cisplatin and gemcitabine, due to her 78 advanced age, alcohol abuse, poor nutrition, she is not a good candidate for chemotherapy.  Her tumor has been growing slowly, she did not have many symptoms related to that, I think hospice and palliative care is a better option for her.  I discussed with her and her son Truman Hayward in length, and both of them agree with hospice. Will refer her  -she is OK to discharge from oncology stand point."  Discharge Instructions Discharge Instructions    Discharge instructions   Complete by:  As directed    You were admitted with a rib fracture due to fall and found to have what is thought to be cancer of the gallbladder. The final pathology report is still pending but the oncologist (cancer doctor) says you can be discharged with plans to follow up in her office to discuss these results and decide on further treatments. As of now, just continue using tylenol and ibuprofen to help with pain. If you do not hear from the oncologist's office in the next couple days, call the number listed to schedule an appointment. If your symptoms worsen, seek medical attention right away.   Increase activity slowly   Complete by:  As directed      Allergies as of 03/26/2018   No Known Allergies     Medication List    STOP taking these medications   potassium chloride SA 20 MEQ tablet Commonly known as:  K-DUR,KLOR-CON     TAKE these medications  acetaminophen 500 MG tablet Commonly known as:  TYLENOL Take 500 mg by mouth every 6 (six) hours as needed for moderate pain.   ibuprofen 600 MG tablet Commonly known as:  ADVIL,MOTRIN Take 1 tablet (600 mg total) by mouth every 6 (six) hours as needed (pain).            Durable Medical Equipment  (From  admission, onward)         Start     Ordered   03/25/18 1514  For home use only DME Walker rolling  Once    Question:  Patient needs a walker to treat with the following condition  Answer:  Unsteady gait   03/25/18 [place] Follow up.   Why:  home rolling walker Contact information: [address] [place] [address] [phone]        [facility], [facility] Follow up.   Specialty:  [place] Why:  [facility] nursing/physical/occupational therapy/aide,social worker Contact information: [address] [place] [address] [phone]        Benito Mccreedy, MD. Schedule an appointment as soon as possible for a visit in 1 week(s).   Specialty:  Internal Medicine Contact information: [place] [address] [phone]        Truitt Merle, MD. Call.   Specialties:  Hematology, Oncology Why:  to schedule follow up appointment with cancer doctor  Contact information: [place] [address] [phone]          No Known Allergies  Consultations:  IR  Oncology  Surgery  Procedures/Studies: Dg Chest 2 View  Result Date: 03/23/2018 CLINICAL DATA:  Shortness of breath, cough, fall EXAM: CHEST - 2 VIEW COMPARISON:  CT chest dated 03/20/2018 FINDINGS: Lungs are essentially clear.  No pleural effusion or pneumothorax. The heart is normal in size. Nondisplaced right lateral 10th and 11th rib fractures. Degenerative changes of the thoracic spine. IMPRESSION: Nondisplaced right lateral 10th and 11th rib fractures. Electronically Signed   By: Julian Hy M.D.   On: 03/23/2018 09:15   Dg Ribs Unilateral W/chest Right  Result Date: 03/20/2018 CLINICAL DATA:  Acute chest and RIGHT rib pain following fall today. EXAM: RIGHT RIBS AND CHEST - 3+ VIEW COMPARISON:  08/25/2016 and prior radiographs FINDINGS: UPPER limits normal heart size  and mild LEFT basilar scarring again noted. There is no evidence of focal airspace disease, pulmonary edema, suspicious pulmonary nodule/mass, pleural effusion, or pneumothorax. A nondisplaced fracture of the anterior RIGHT 10th rib is present. IMPRESSION: Nondisplaced anterior RIGHT 10th rib fracture. No pleural effusion or pneumothorax. Electronically Signed   By: Margarette Canada M.D.   On: 03/20/2018 15:39   Dg Pelvis 1-2 Views  Result Date: 03/21/2018 CLINICAL DATA:  Trauma secondary to a fall at home yesterday. EXAM: PELVIS - 1-2 VIEW COMPARISON:  CT scan dated 03/20/2018 FINDINGS: There is no evidence of pelvic fracture or diastasis. There are slight arthritic changes of both hips. Bowel gas pattern is normal. IMPRESSION: No acute abnormalities of the pelvis. Electronically Signed   By: Lorriane Shire M.D.   On: 03/21/2018 08:09   Ct Head Wo Contrast  Result Date: 03/21/2018 CLINICAL DATA:  Altered level of consciousness, history of alcohol abuse, hypertension, smoker EXAM: CT HEAD WITHOUT CONTRAST TECHNIQUE: Contiguous axial images were obtained from the base of the skull through the vertex without intravenous  contrast. Sagittal and coronal MPR images reconstructed from axial data set. COMPARISON:  06/10/2015 FINDINGS: Brain: Generalized atrophy. Normal ventricular morphology. No midline shift or mass effect. Probable small vessel chronic ischemic changes of deep cerebral white matter. No intracranial hemorrhage, mass lesion, evidence of acute infarction, or extra-axial fluid collection. Vascular: Atherosclerotic calcifications of internal carotid arteries at skull base Skull: Intact Sinuses/Orbits: Frontal sinus osteoma unchanged. Paranasal sinuses and mastoid air cells otherwise clear. Other: N/A IMPRESSION: Atrophy with probable small vessel chronic ischemic changes of deep cerebral white matter. No acute intracranial abnormalities. Electronically Signed   By: Lavonia Dana M.D.   On: 03/21/2018 08:00    Ct Chest W Contrast  Result Date: 03/20/2018 CLINICAL DATA:  Right-sided rib pain after mechanical fall. EXAM: CT CHEST, ABDOMEN, AND PELVIS WITH CONTRAST TECHNIQUE: Multidetector CT imaging of the chest, abdomen and pelvis was performed following the standard protocol during bolus administration of intravenous contrast. CONTRAST:  170m ISOVUE-300 IOPAMIDOL (ISOVUE-300) INJECTION 61% COMPARISON:  CT abdomen and pelvis 08/28/2016. MRI abdomen 08/30/2016. FINDINGS: CT CHEST FINDINGS Cardiovascular: Normal caliber thoracic aorta. Scattered aortic calcifications. Great vessel origins are patent. Mild cardiac enlargement. No significant pericardial effusion. Coronary artery calcifications. Mediastinum/Nodes: Esophagus is decompressed. No significant lymphadenopathy in the chest. Lungs/Pleura: Scattered emphysematous changes in the lungs. Mild dependent atelectasis. Focal area consolidation in the right lung base could represent pneumonia possibly aspiration or contusion. No pleural effusions. No pneumothorax. Musculoskeletal: Degenerative changes in the spine. No vertebral compression deformities. Normal alignment. Sternum appears intact. Minimally displaced fracture of the right anterolateral tenth rib. CT ABDOMEN PELVIS FINDINGS Hepatobiliary: Large poorly defined hypoenhancing mass replacing the gallbladder and with infiltration into the adjacent liver involving segments 4 and 5. There is infiltration in the adjacent fat suggesting local spread. 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 Fat planes with the adjacent duodenum and splenic flexure of the colon or non distinct suggesting possible direct invasion. No bile duct dilatation. Pancreas: Unremarkable. No pancreatic ductal dilatation or surrounding inflammatory changes. Spleen: Normal in size without focal abnormality. Adrenals/Urinary Tract: Adrenal  glands are unremarkable. Kidneys are normal, without renal calculi, focal lesion, or hydronephrosis. Bladder is unremarkable. Stomach/Bowel: Stomach, small bowel, and colon are mostly decompressed. Colonic diverticula without evidence of diverticulitis. Appendix is not identified. Vascular/Lymphatic: Calcification of the aorta. No aneurysm. No significant lymphadenopathy. Reproductive: Surgical absence of the gallbladder. Other: Free fluid in the pelvis may be reactive or malignant. No free air. Abdominal wall musculature appears intact. Musculoskeletal: Degenerative changes in the spine and hips. Schmorl's node at L1. No destructive bone lesions. No acute fractures are identified. IMPRESSION: 1. Minimally displaced fracture of the right anterolateral tenth rib. 2. Focal consolidation in the right lung base could represent pneumonia, contusion, or aspiration. 3. 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 4. Free fluid in the pelvis may be reactive or malignant. 5. Colonic diverticulosis without evidence of diverticulitis. Aortic Atherosclerosis (ICD10-I70.0). Electronically Signed   By: WLucienne CapersM.D.   On: 03/20/2018 22:11   Ct Abdomen Pelvis W Contrast  Result Date: 03/20/2018 CLINICAL DATA:  Right-sided rib pain after mechanical fall. EXAM: CT CHEST, ABDOMEN, AND PELVIS WITH CONTRAST TECHNIQUE: Multidetector CT imaging of the chest, abdomen and pelvis was performed following the standard protocol during bolus administration of intravenous contrast. CONTRAST:  1026m  ISOVUE-300 IOPAMIDOL (ISOVUE-300) INJECTION 61% COMPARISON:  CT abdomen and pelvis 08/28/2016. MRI abdomen 08/30/2016. FINDINGS: CT CHEST FINDINGS Cardiovascular: Normal caliber thoracic aorta. Scattered aortic calcifications. Great vessel origins are patent. Mild cardiac enlargement. No  significant pericardial effusion. Coronary artery calcifications. Mediastinum/Nodes: Esophagus is decompressed. No significant lymphadenopathy in the chest. Lungs/Pleura: Scattered emphysematous changes in the lungs. Mild dependent atelectasis. Focal area consolidation in the right lung base could represent pneumonia possibly aspiration or contusion. No pleural effusions. No pneumothorax. Musculoskeletal: Degenerative changes in the spine. No vertebral compression deformities. Normal alignment. Sternum appears intact. Minimally displaced fracture of the right anterolateral tenth rib. CT ABDOMEN PELVIS FINDINGS Hepatobiliary: Large poorly defined hypoenhancing mass replacing the gallbladder and with infiltration into the adjacent liver involving segments 4 and 5. There is infiltration in the adjacent fat suggesting local spread. 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 Fat planes with the adjacent duodenum and splenic flexure of the colon or non distinct suggesting possible direct invasion. No bile duct dilatation. Pancreas: Unremarkable. No pancreatic ductal dilatation or surrounding inflammatory changes. Spleen: Normal in size without focal abnormality. Adrenals/Urinary Tract: Adrenal glands are unremarkable. Kidneys are normal, without renal calculi, focal lesion, or hydronephrosis. Bladder is unremarkable. Stomach/Bowel: Stomach, small bowel, and colon are mostly decompressed. Colonic diverticula without evidence of diverticulitis. Appendix is not identified. Vascular/Lymphatic: Calcification of the aorta. No aneurysm. No significant lymphadenopathy. Reproductive: Surgical absence of the gallbladder. Other: Free fluid in the pelvis may be reactive or malignant. No free air. Abdominal wall musculature appears intact. Musculoskeletal: Degenerative changes in the spine and hips. Schmorl's node at L1. No  destructive bone lesions. No acute fractures are identified. IMPRESSION: 1. Minimally displaced fracture of the right anterolateral tenth rib. 2. Focal consolidation in the right lung base could represent pneumonia, contusion, or aspiration. 3. 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 4. Free fluid in the pelvis may be reactive or malignant. 5. Colonic diverticulosis without evidence of diverticulitis. Aortic Atherosclerosis (ICD10-I70.0). Electronically Signed   By: Lucienne Capers M.D.   On: 03/20/2018 22:11   Mr Liver W Wo Contrast  Result Date: 03/22/2018 CLINICAL DATA:  Gallbladder mass, evaluate liver EXAM: MRI ABDOMEN WITHOUT AND WITH CONTRAST TECHNIQUE: Multiplanar multisequence MR imaging of the abdomen was performed both before and after the administration of intravenous contrast. CONTRAST:  6 mL Gadovist IV COMPARISON:  CT abdomen/pelvis dated 03/20/2018. MRI abdomen dated 08/30/2016. CT abdomen/pelvis dated 08/28/2016. FINDINGS: Motion degraded images. Lower chest: Small right and trace left pleural effusions. Hepatobiliary: Progressive gallbladder mass (worrisome for adenocarcinoma), now measuring 7.7 x 11.6 x 10.6, increased from 2018. Mass abuts liver and direct extension is difficult to exclude. Liver is otherwise within normal limits. No discrete/separate metastases. No intrahepatic or extrahepatic ductal dilatation. Pancreas:  Within normal limits. Spleen:  Within normal limits. Adrenals/Urinary Tract:  Adrenal glands are within normal limits. Kidneys are within normal limits.  No hydronephrosis. Stomach/Bowel: Stomach is notable for a tiny hiatal hernia. Visualized bowel is unremarkable. Vascular/Lymphatic:  No evidence of aneurysm. 16 mm short axis node in the porta hepatis (series 6/image 31), suspicious. Other:  No abdominal ascites.  Musculoskeletal: No focal osseous lesions. IMPRESSION: Motion degraded images. 11.6 cm gallbladder mass, progressive from 2018, worrisome for adenocarcinoma. Mass abuts the liver and direct extension is difficult to exclude. Ashley  cm short axis node in the porta hepatis, suspicious for nodal metastasis. Electronically Signed   By: Julian Hy M.D.   On: 03/22/2018 04:29   US Biopsy (liver)  Result Date: 03/24/2018 INDICATION: 78 year old female with a history of liver mass EXAM: ULTRASOUND-GUIDED BIOPSY RIGHT LIVER MASS MEDICATIONS: None. ANESTHESIA/SEDATION: Moderate (conscious) sedation was employed during this procedure. A total of Versed 1.5 mg and Fentanyl 50 mcg was administered intravenously. Moderate Sedation Time: 10 minutes. The patient's level of consciousness and vital signs were monitored continuously by radiology nursing throughout the procedure under my direct supervision. FLUOROSCOPY TIME:  None COMPLICATIONS: None PROCEDURE: The procedure, risks, benefits, and alternatives were explained to the patient. Questions regarding the procedure were encouraged and answered. The patient understands and consents to the procedure. Ultrasound survey of the right liver lobe performed with images stored and sent to PACs. The right lower thorax/right upper abdomen was prepped with chlorhexidine in a sterile fashion, and a sterile drape was applied covering the operative field. A sterile gown and sterile gloves were used for the procedure. Local anesthesia was provided with 1% Lidocaine. Once the patient is prepped and draped sterilely and the skin and subcutaneous tissues were generously infiltrated with 1% lidocaine, a small stab incision was made with an 11 blade scalpel. A 17 gauge introducer needle was advanced under ultrasound guidance in an intercostal location into the right liver lobe, targeting the mass. The stylet was removed, and multiple 18 gauge core biopsy were retrieved. Samples were placed  into formalin for transportation to the lab. Three separate Gel-Foam pledgets were then infused with a small amount of saline for assistance with hemostasis. The needle was removed, and a final ultrasound image was performed. The patient tolerated the procedure well and remained hemodynamically stable throughout. No complications were encountered and no significant blood loss was encounter. IMPRESSION: Status post ultrasound-guided biopsy of right liver mass. Tissue specimen sent to pathology for complete histopathologic analysis. Signed, Dulcy Fanny. Dellia Nims, RPVI [facility] Radiology Electronically Signed   By: Corrie Mckusick D.O.   On: 03/24/2018 13:45   Subjective: Liver biopsy  Discharge Exam: Vitals:   03/26/18 0445 03/26/18 1313  BP: 118/72 121/70  Pulse: (!) 57 66  Resp: 20 18  Temp: 98.2 F (36.8 C) 98.9 F (37.2 C)  SpO2: 100% 96%   General: Pt is alert, awake, not in acute distress Cardiovascular: RRR, S1/S2 +, no rubs, no gallops Respiratory: CTA bilaterally, no wheezing, no rhonchi Abdominal: Soft, NT, ND, bowel sounds +. +tenderness to RUQ over ribs. Extremities: No edema, no cyanosis  Labs: Basic Metabolic Panel: Recent Labs  Lab 03/20/18 1754 03/21/18 0701 03/21/18 1042 03/22/18 0519 03/25/18 0759  NA 136 140 140 135 134*  K 3.2* 3.7 3.7 3.7 3.9  CL 102 112* 105 101 103  CO2 _0 GLUCOSE 99 88 123* 99 96  BUN 8 7* 9 6* 16  CREATININE 0.59 0.47 0.58 0.47 0.61  CALCIUM 8.5* 7.6* 8.5* 8.5* 8.5*  MG  --   --   --   --  1.7   Liver Function Tests: Recent Labs  Lab 03/20/18 1754 03/21/18 1042 03/22/18 0519 03/25/18 0759  AST _1 ALT _2 ALKPHOS 69 66 67 61  BILITOT 0.8 0.8 1.2 0.7  PROT 8.5* 8.2* 8.3* 8.4*  ALBUMIN 2.9* 2.7* 2.8* 2.6*   CBC: Recent Labs  Lab 03/20/18 1754 03/21/18  3361 03/22/18 0519 03/25/18 0759  WBC 9.2 7.6 8.3 7.5  NEUTROABS 6.0  --  5.9 5.2  HGB  11.1* 11.8* 12.5 12.4  HCT 34.7* 37.7 39.3 39.7  MCV 96.9 97.7 94.9 98.0  PLT 268 250 269 275   Urinalysis    Component Value Date/Time   COLORURINE STRAW (A) 03/20/2018 1823   APPEARANCEUR CLEAR 03/20/2018 1823   LABSPEC 1.002 (L) 03/20/2018 1823   PHURINE 7.0 03/20/2018 1823   GLUCOSEU NEGATIVE 03/20/2018 1823   HGBUR NEGATIVE 03/20/2018 1823   BILIRUBINUR NEGATIVE 03/20/2018 [place] 03/20/2018 [place] 03/20/2018 1823   UROBILINOGEN 1.0 09/21/2012 2140   NITRITE NEGATIVE 03/20/2018 1823   LEUKOCYTESUR NEGATIVE 03/20/2018 1823    Time coordinating discharge: Approximately 40 minutes  Patrecia Pour, MD  [facility] 03/26/2018, 3:58 PM Pager [phone]

## 2018-03-31 ENCOUNTER — Telehealth: Payer: Self-pay

## 2018-03-31 NOTE — Telephone Encounter (Signed)
Faxed signed orders back to [facility], sent to HIM for scanning to chart.

## 2018-04-17 ENCOUNTER — Telehealth: Payer: Self-pay

## 2018-04-17 NOTE — Telephone Encounter (Signed)
Faxed signed order back to [facility] [place] at [phone], sent to HIM for scanning to chart.

## 2018-05-04 IMAGING — MR MR ABDOMEN WO/W CM MRCP
8 of 15 series · 18 of 48 positions shown · IV contrast (multihance)
Comparison: 08/28/2016 CT abdomen/ pelvis.

CLINICAL DATA: Inpatient. Patient admitted with chest and abdominal
pain. Gallbladder mass suspicious for gallbladder carcinoma on CT
study.

EXAM:
MRI ABDOMEN WITHOUT AND WITH CONTRAST (INCLUDING MRCP)
TECHNIQUE: Multiplanar multisequence MR imaging of the abdomen was performed
both before and after the administration of intravenous contrast.
Heavily T2-weighted images of the biliary and pancreatic ducts were
obtained, and three-dimensional MRCP images were rendered by post
processing.
CONTRAST:  13mL MULTIHANCE GADOBENATE DIMEGLUMINE 529 MG/ML IV SOLN

[Series 5: cor ssfse nav · coronal · 6.0mm · 0.78mm/px · 1 of 32 slices shown]
[im 1/32]
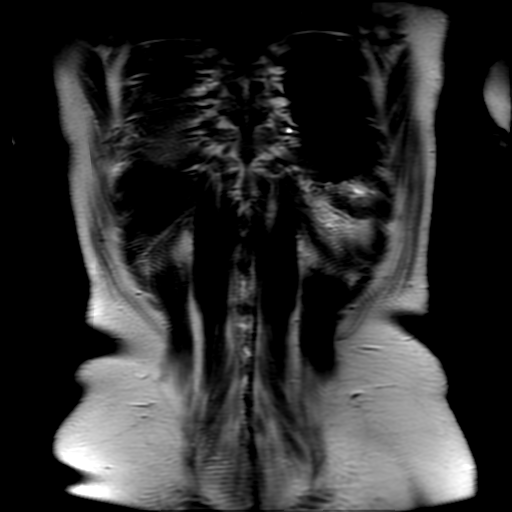

[Series 7: ax ssfse nav · axial · 6.0mm · 0.74mm/px · 1 of 29 slices shown]
[im 1/29]
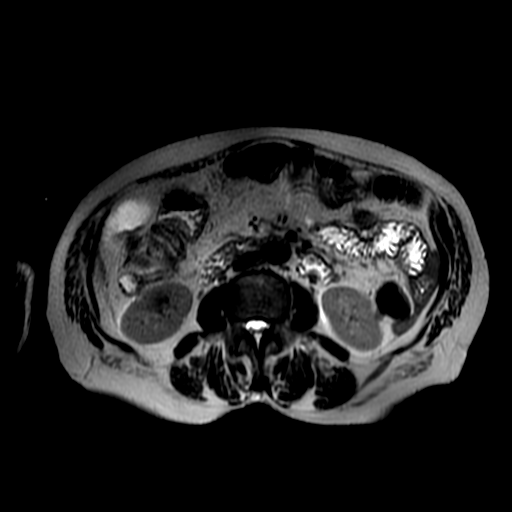

[Series 8: T2 fat-sat · axial · 6.0mm · 0.74mm/px · 1 of 29 slices shown]
[im 1/29]
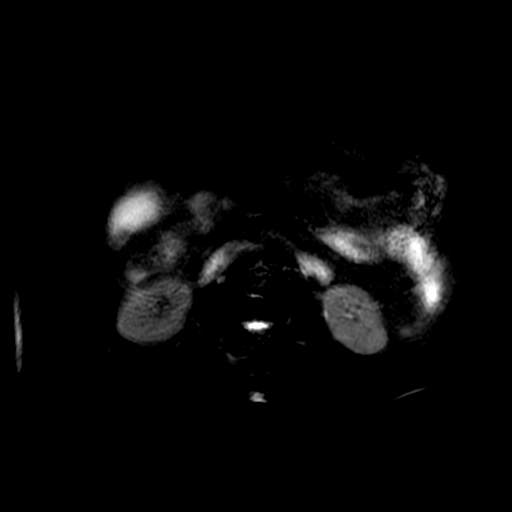

[Series 9: DWI b500 · axial · 8.0mm · 1.95mm/px · 1 of 42 slices shown]
[im 1/42]
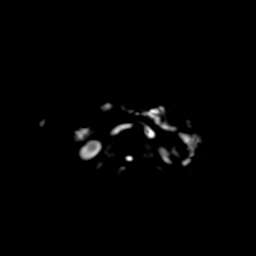

[Series 11: radial 2d thick · sagittal · 40.0mm · 0.86mm/px · 1 of 6 slices shown]
[im 1/6]
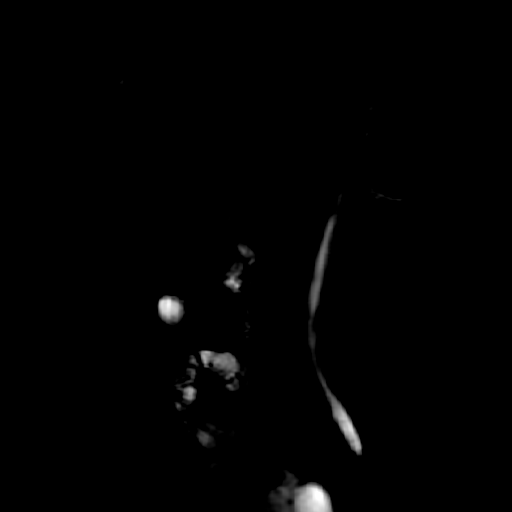

[Series 12: T1 dynamic · axial · 5.0mm · 0.82mm/px · z∈[-87,+170]mm · 4 of 104 slices shown]
[im 1/104]
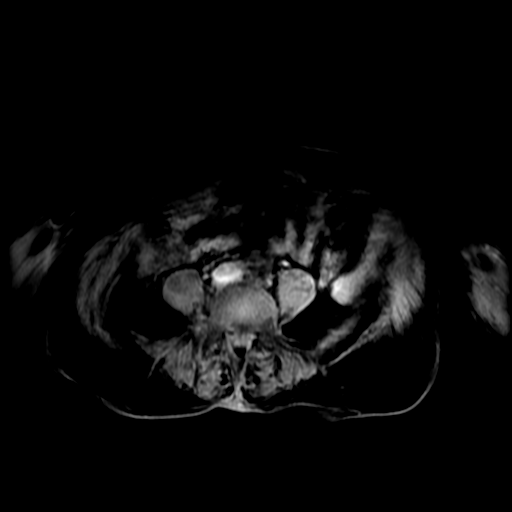
[im 35/104]
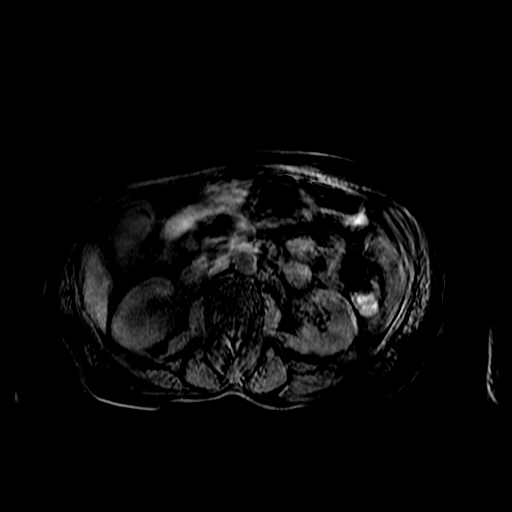
[im 69/104]
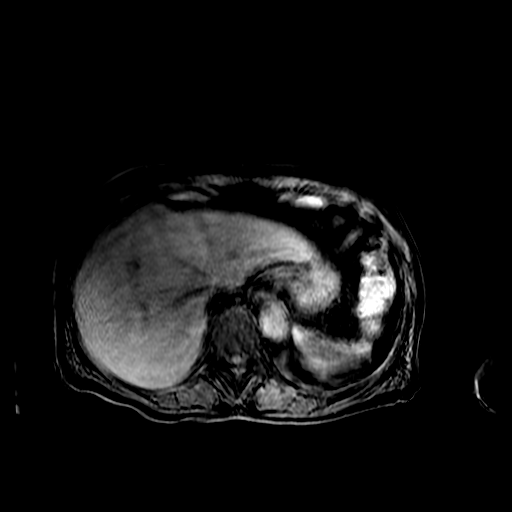
[im 104/104]
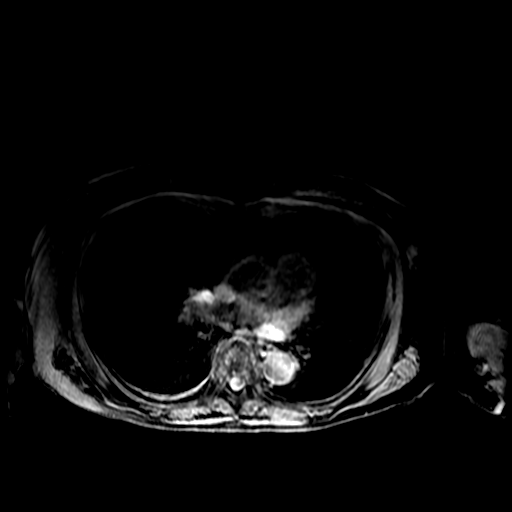

[Series 14: T1 dynamic post-contrast · axial · non-contrast · 5.0mm · 0.82mm/px · z∈[-87,+170]mm · 5 of 104 slices shown (1 of 2)]
[im 1/104]
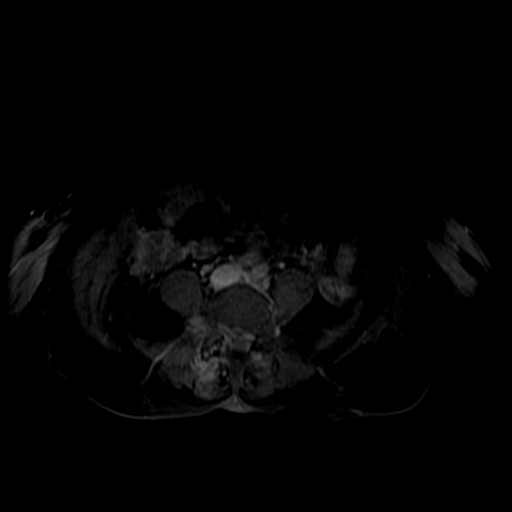
[im 26/104]
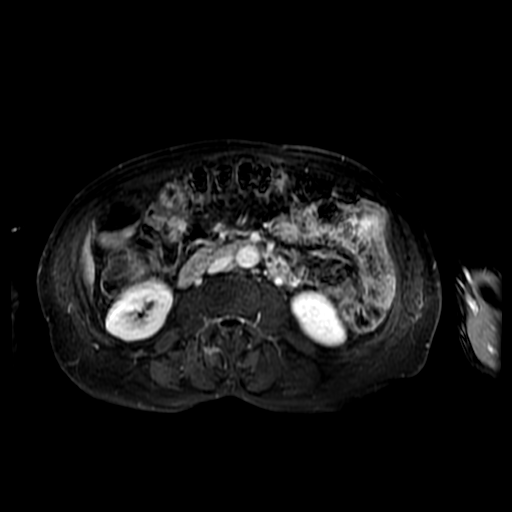
[im 52/104]
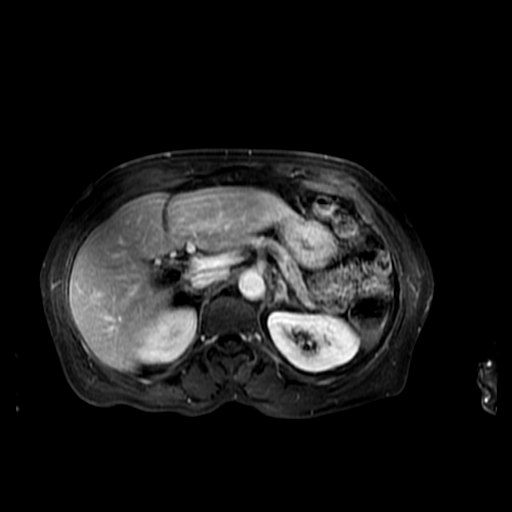
[im 78/104]
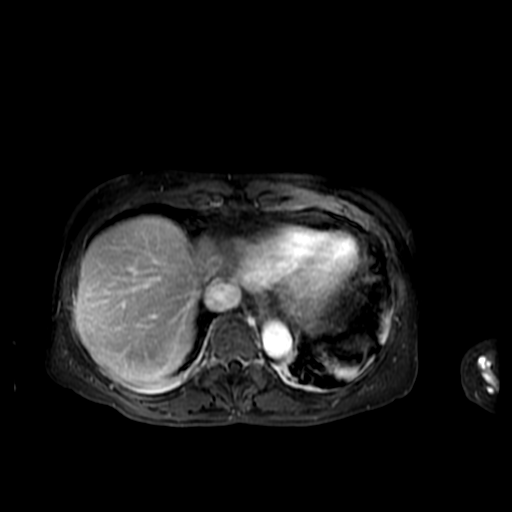
[im 104/104]
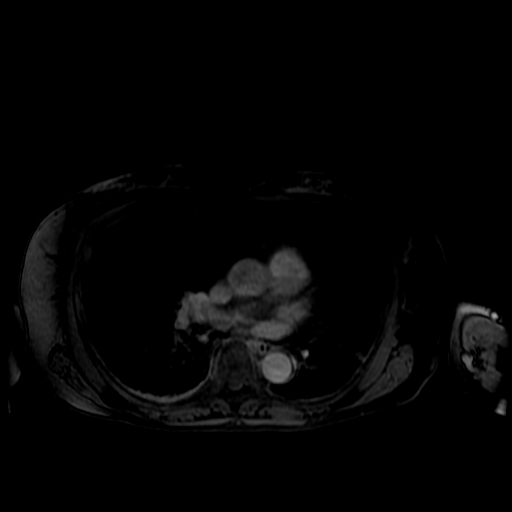

[Series 15: T1 dynamic post-contrast · axial · non-contrast · 5.0mm · 0.82mm/px · z∈[-87,+105]mm · 4 of 104 slices shown (2 of 2)]
[im 1/104]
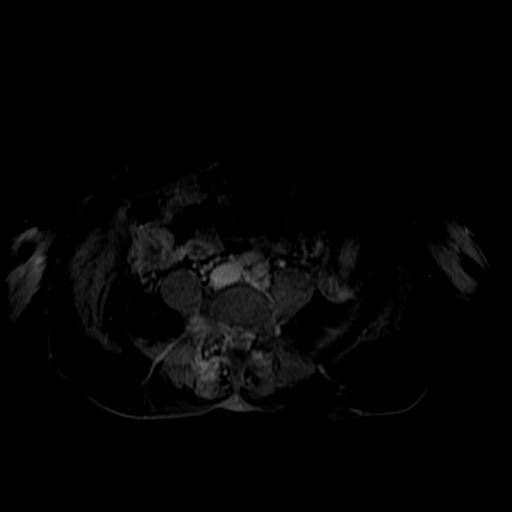
[im 26/104]
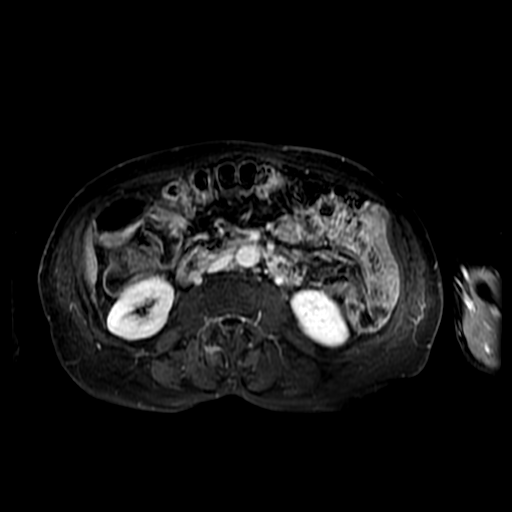
[im 52/104]
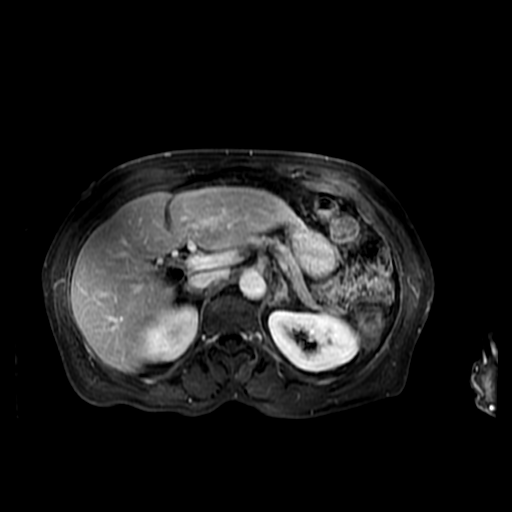
[im 78/104]
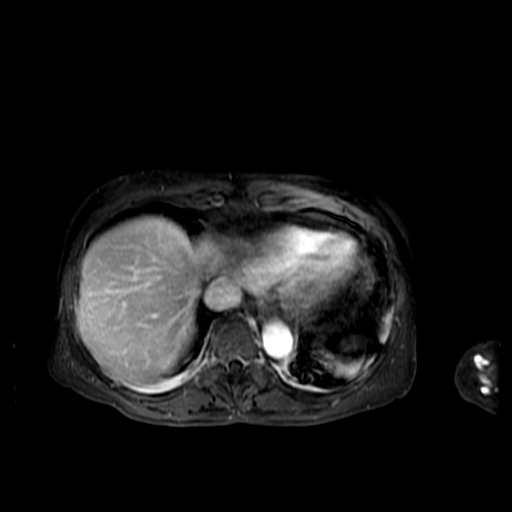

[18 of 48 positions shown; findings below may reference images not displayed]

FINDINGS: Significantly motion degraded scan.

Lower chest: Mild scarring versus atelectasis at the dependent lung
bases bilaterally, right greater than left, unchanged. Mild
cardiomegaly.

Hepatobiliary: There is slight hypertrophy of the lateral segment
left liver lobe with possible slight liver surface irregularity. No
hepatic steatosis. There is an irregular 5.8 x 3.9 x 4.7 cm avidly
enhancing gallbladder mass centered in the upper body/ fundus
(series 15/ image 71) with circumferential involvement of the
gallbladder wall. This gallbladder mass appears adherent to the
undersurface of the adjacent right liver lobe, with possible minimal
direct invasion of the adjacent subcapsular liver up to 3 mm depth
(series 5/image 19). Otherwise no liver masses. No cholelithiasis.
No pericholecystic fluid. No biliary ductal dilatation. Common bile
duct diameter 6 mm, top-normal. No choledocholithiasis.

Pancreas: No pancreatic mass or duct dilation.  No pancreas divisum.

Spleen: Normal size. No mass.

Adrenals/Urinary Tract: No discrete adrenal nodules. No
hydronephrosis. Normal kidneys with no renal mass.

Stomach/Bowel: Grossly normal stomach. Visualized small and large
bowel is normal caliber, with no bowel wall thickening.

Vascular/Lymphatic: Atherosclerotic nonaneurysmal abdominal aorta.
Patent portal, splenic, hepatic and renal veins. No pathologically
enlarged lymph nodes in the abdomen.

Other: No abdominal ascites or focal fluid collection.

Musculoskeletal: No aggressive appearing focal osseous lesions.
IMPRESSION: 1. Irregular 5.8 x 3.9 x 4.7 cm avidly enhancing gallbladder mass
circumferentially involving the upper body/fundus of the
gallbladder, suspicious for primary gallbladder carcinoma.
Gallbladder mass appears adherent to the adjacent undersurface of
the right liver lobe, cannot exclude slight invasion of the adjacent
subcapsular liver up to 3 mm depth .
2. Otherwise no findings of metastatic disease in the abdomen.
3. No cholelithiasis.  No biliary ductal dilatation.
4. Possible early morphologic changes of cirrhosis. No secondary
findings of portal hypertension.
5.  Aortic Atherosclerosis ([id number]).

## 2018-05-21 ENCOUNTER — Telehealth: Payer: Self-pay

## 2018-05-21 NOTE — Telephone Encounter (Signed)
Faxed signed orders back to [facility], sent to [facility] for scanning to chart.

## 2018-06-25 ENCOUNTER — Telehealth: Payer: Self-pay

## 2018-06-25 NOTE — Telephone Encounter (Signed)
Faxed signed orders back to [facility], sent to [facility] for scanning to chart.  

## 2018-09-25 ENCOUNTER — Telehealth: Payer: Self-pay

## 2018-09-25 NOTE — Telephone Encounter (Signed)
Faxed signed order for [facility]osp[facility] back and sent to [facility] for scanning to chart.

## 2018-10-21 DEATH — deceased
